# Patient Record
Sex: Male | Born: 1955 | Race: White | Hispanic: No | Smoking: Former smoker
Health system: Southern US, Community
[De-identification: ages and names within clinical notes are randomized; demographics above are authoritative.]

## PROBLEM LIST (undated history)

## (undated) DIAGNOSIS — I359 Nonrheumatic aortic valve disorder, unspecified: Secondary | ICD-10-CM

## (undated) DIAGNOSIS — C61 Malignant neoplasm of prostate: Secondary | ICD-10-CM

## (undated) DIAGNOSIS — G4733 Obstructive sleep apnea (adult) (pediatric): Secondary | ICD-10-CM

## (undated) DIAGNOSIS — R079 Chest pain, unspecified: Secondary | ICD-10-CM

## (undated) DIAGNOSIS — M1710 Unilateral primary osteoarthritis, unspecified knee: Secondary | ICD-10-CM

## (undated) DIAGNOSIS — E7800 Pure hypercholesterolemia, unspecified: Secondary | ICD-10-CM

## (undated) DIAGNOSIS — R0602 Shortness of breath: Secondary | ICD-10-CM

## (undated) DIAGNOSIS — F411 Generalized anxiety disorder: Secondary | ICD-10-CM

## (undated) DIAGNOSIS — G4700 Insomnia, unspecified: Secondary | ICD-10-CM

## (undated) DIAGNOSIS — IMO0002 Reserved for concepts with insufficient information to code with codable children: Secondary | ICD-10-CM

## (undated) DIAGNOSIS — I10 Essential (primary) hypertension: Secondary | ICD-10-CM

## (undated) DIAGNOSIS — Z9989 Dependence on other enabling machines and devices: Secondary | ICD-10-CM

## (undated) HISTORY — DX: Insomnia, unspecified: G47.00

## (undated) HISTORY — PX: ELBOW SURGERY: SHX618

## (undated) HISTORY — PX: LUMBAR LAMINECTOMY: SHX95

## (undated) HISTORY — DX: Reserved for concepts with insufficient information to code with codable children: IMO0002

## (undated) HISTORY — DX: Obstructive sleep apnea (adult) (pediatric): G47.33

## (undated) HISTORY — DX: Pure hypercholesterolemia, unspecified: E78.00

## (undated) HISTORY — DX: Generalized anxiety disorder: F41.1

## (undated) HISTORY — DX: Malignant neoplasm of prostate: C61

## (undated) HISTORY — DX: Nonrheumatic aortic valve disorder, unspecified: I35.9

## (undated) HISTORY — PX: PROSTATE BIOPSY: SHX241

## (undated) HISTORY — DX: Shortness of breath: R06.02

## (undated) HISTORY — DX: Chest pain, unspecified: R07.9

## (undated) HISTORY — PX: COLONOSCOPY: SHX174

## (undated) HISTORY — DX: Unilateral primary osteoarthritis, unspecified knee: M17.10

## (undated) HISTORY — DX: Dependence on other enabling machines and devices: Z99.89

## (undated) HISTORY — DX: Essential (primary) hypertension: I10

## (undated) HISTORY — PX: NEUROPLASTY / TRANSPOSITION MEDIAN NERVE AT CARPAL TUNNEL BILATERAL: SUR894

---

## 1998-07-09 ENCOUNTER — Ambulatory Visit: Admission: RE | Admit: 1998-07-09 | Discharge: 1998-07-09 | Payer: Self-pay | Admitting: *Deleted

## 1999-12-23 ENCOUNTER — Emergency Department (HOSPITAL_COMMUNITY): Admission: EM | Admit: 1999-12-23 | Discharge: 1999-12-23 | Payer: Self-pay | Admitting: Emergency Medicine

## 1999-12-23 ENCOUNTER — Encounter: Payer: Self-pay | Admitting: Emergency Medicine

## 2004-05-17 ENCOUNTER — Encounter: Admission: RE | Admit: 2004-05-17 | Discharge: 2004-05-17 | Payer: Self-pay | Admitting: Orthopedic Surgery

## 2004-06-11 ENCOUNTER — Encounter: Admission: RE | Admit: 2004-06-11 | Discharge: 2004-06-11 | Payer: Self-pay | Admitting: Orthopedic Surgery

## 2004-06-24 ENCOUNTER — Encounter: Admission: RE | Admit: 2004-06-24 | Discharge: 2004-06-24 | Payer: Self-pay | Admitting: Orthopedic Surgery

## 2006-10-31 ENCOUNTER — Emergency Department (HOSPITAL_COMMUNITY): Admission: EM | Admit: 2006-10-31 | Discharge: 2006-10-31 | Payer: Self-pay | Admitting: Emergency Medicine

## 2007-09-12 ENCOUNTER — Emergency Department (HOSPITAL_COMMUNITY): Admission: EM | Admit: 2007-09-12 | Discharge: 2007-09-12 | Payer: Self-pay | Admitting: Emergency Medicine

## 2007-09-22 ENCOUNTER — Ambulatory Visit: Payer: Self-pay

## 2007-09-27 ENCOUNTER — Ambulatory Visit: Payer: Self-pay

## 2007-10-06 ENCOUNTER — Encounter (INDEPENDENT_AMBULATORY_CARE_PROVIDER_SITE_OTHER): Payer: Self-pay | Admitting: Internal Medicine

## 2007-10-06 ENCOUNTER — Ambulatory Visit: Payer: Self-pay

## 2007-10-07 ENCOUNTER — Ambulatory Visit: Payer: Self-pay | Admitting: Cardiovascular Disease

## 2007-10-07 LAB — CONVERTED CEMR LAB
BUN: 16 mg/dL (ref 6–23)
Basophils Absolute: 0.1 10*3/uL (ref 0.0–0.1)
Basophils Relative: 1.4 % — ABNORMAL HIGH (ref 0.0–1.0)
CO2: 32 meq/L (ref 19–32)
Calcium: 9.6 mg/dL (ref 8.4–10.5)
Chloride: 100 meq/L (ref 96–112)
Creatinine, Ser: 1.1 mg/dL (ref 0.4–1.5)
Eosinophils Absolute: 0.1 10*3/uL (ref 0.0–0.6)
Eosinophils Relative: 1.8 % (ref 0.0–5.0)
GFR calc Af Amer: 91 mL/min
GFR calc non Af Amer: 75 mL/min
Glucose, Bld: 113 mg/dL — ABNORMAL HIGH (ref 70–99)
HCT: 44.1 % (ref 39.0–52.0)
Hemoglobin: 14.9 g/dL (ref 13.0–17.0)
INR: 0.8 (ref 0.8–1.0)
Lymphocytes Relative: 22.5 % (ref 12.0–46.0)
MCHC: 33.8 g/dL (ref 30.0–36.0)
MCV: 93.8 fL (ref 78.0–100.0)
Monocytes Absolute: 0.8 10*3/uL — ABNORMAL HIGH (ref 0.2–0.7)
Monocytes Relative: 14.5 % — ABNORMAL HIGH (ref 3.0–11.0)
Neutro Abs: 3.3 10*3/uL (ref 1.4–7.7)
Neutrophils Relative %: 59.8 % (ref 43.0–77.0)
Platelets: 281 10*3/uL (ref 150–400)
Potassium: 4 meq/L (ref 3.5–5.1)
Prothrombin Time: 10.6 s — ABNORMAL LOW (ref 10.9–13.3)
RBC: 4.71 M/uL (ref 4.22–5.81)
RDW: 11.9 % (ref 11.5–14.6)
Sodium: 139 meq/L (ref 135–145)
WBC: 5.5 10*3/uL (ref 4.5–10.5)
aPTT: 23.7 s (ref 21.7–29.8)

## 2007-10-13 ENCOUNTER — Inpatient Hospital Stay (HOSPITAL_BASED_OUTPATIENT_CLINIC_OR_DEPARTMENT_OTHER): Admission: RE | Admit: 2007-10-13 | Discharge: 2007-10-13 | Payer: Self-pay | Admitting: Cardiovascular Disease

## 2007-10-13 ENCOUNTER — Ambulatory Visit: Payer: Self-pay | Admitting: Cardiovascular Disease

## 2007-10-13 DIAGNOSIS — I2510 Atherosclerotic heart disease of native coronary artery without angina pectoris: Secondary | ICD-10-CM | POA: Insufficient documentation

## 2007-11-09 ENCOUNTER — Ambulatory Visit: Payer: Self-pay | Admitting: Cardiovascular Disease

## 2008-02-11 ENCOUNTER — Ambulatory Visit: Payer: Self-pay | Admitting: Cardiovascular Disease

## 2008-05-18 ENCOUNTER — Ambulatory Visit: Payer: Self-pay | Admitting: Cardiovascular Disease

## 2008-06-13 ENCOUNTER — Ambulatory Visit: Payer: Self-pay | Admitting: Cardiovascular Disease

## 2008-06-13 LAB — CONVERTED CEMR LAB
BUN: 13 mg/dL (ref 6–23)
CO2: 29 meq/L (ref 19–32)
Calcium: 9.9 mg/dL (ref 8.4–10.5)
Chloride: 98 meq/L (ref 96–112)
Creatinine, Ser: 1 mg/dL (ref 0.4–1.5)
GFR calc Af Amer: 101 mL/min
GFR calc non Af Amer: 84 mL/min
Glucose, Bld: 120 mg/dL — ABNORMAL HIGH (ref 70–99)
Potassium: 4.2 meq/L (ref 3.5–5.1)
Sodium: 136 meq/L (ref 135–145)

## 2008-07-10 ENCOUNTER — Ambulatory Visit: Payer: Self-pay | Admitting: Cardiovascular Disease

## 2008-10-31 ENCOUNTER — Ambulatory Visit: Payer: Self-pay | Admitting: Licensed Clinical Social Worker

## 2008-11-08 ENCOUNTER — Ambulatory Visit: Payer: Self-pay | Admitting: Licensed Clinical Social Worker

## 2008-11-14 ENCOUNTER — Ambulatory Visit: Payer: Self-pay | Admitting: Licensed Clinical Social Worker

## 2008-11-28 ENCOUNTER — Ambulatory Visit: Payer: Self-pay | Admitting: Licensed Clinical Social Worker

## 2008-12-12 ENCOUNTER — Ambulatory Visit: Payer: Self-pay | Admitting: Licensed Clinical Social Worker

## 2008-12-19 ENCOUNTER — Ambulatory Visit: Payer: Self-pay | Admitting: Licensed Clinical Social Worker

## 2008-12-29 ENCOUNTER — Ambulatory Visit: Payer: Self-pay | Admitting: Licensed Clinical Social Worker

## 2009-12-04 ENCOUNTER — Encounter: Payer: Self-pay | Admitting: Cardiovascular Disease

## 2009-12-07 DIAGNOSIS — I10 Essential (primary) hypertension: Secondary | ICD-10-CM | POA: Insufficient documentation

## 2009-12-07 DIAGNOSIS — E7800 Pure hypercholesterolemia, unspecified: Secondary | ICD-10-CM | POA: Insufficient documentation

## 2009-12-07 DIAGNOSIS — I359 Nonrheumatic aortic valve disorder, unspecified: Secondary | ICD-10-CM | POA: Insufficient documentation

## 2009-12-10 DIAGNOSIS — F411 Generalized anxiety disorder: Secondary | ICD-10-CM | POA: Insufficient documentation

## 2009-12-10 DIAGNOSIS — M1710 Unilateral primary osteoarthritis, unspecified knee: Secondary | ICD-10-CM

## 2009-12-10 DIAGNOSIS — IMO0002 Reserved for concepts with insufficient information to code with codable children: Secondary | ICD-10-CM | POA: Insufficient documentation

## 2009-12-10 DIAGNOSIS — G4700 Insomnia, unspecified: Secondary | ICD-10-CM | POA: Insufficient documentation

## 2009-12-11 ENCOUNTER — Ambulatory Visit: Payer: Self-pay | Admitting: Cardiovascular Disease

## 2010-01-21 ENCOUNTER — Telehealth: Payer: Self-pay | Admitting: Cardiovascular Disease

## 2010-01-23 ENCOUNTER — Encounter: Payer: Self-pay | Admitting: Physician Assistant

## 2010-01-23 ENCOUNTER — Ambulatory Visit: Payer: Self-pay | Admitting: Cardiology

## 2010-01-23 DIAGNOSIS — R079 Chest pain, unspecified: Secondary | ICD-10-CM | POA: Insufficient documentation

## 2010-01-23 DIAGNOSIS — R0602 Shortness of breath: Secondary | ICD-10-CM | POA: Insufficient documentation

## 2010-01-28 ENCOUNTER — Telehealth (INDEPENDENT_AMBULATORY_CARE_PROVIDER_SITE_OTHER): Payer: Self-pay | Admitting: *Deleted

## 2010-01-29 ENCOUNTER — Ambulatory Visit: Payer: Self-pay

## 2010-01-29 ENCOUNTER — Encounter (HOSPITAL_COMMUNITY): Admission: RE | Admit: 2010-01-29 | Discharge: 2010-04-10 | Payer: Self-pay | Admitting: Cardiovascular Disease

## 2010-01-29 ENCOUNTER — Encounter: Payer: Self-pay | Admitting: Cardiology

## 2010-01-29 ENCOUNTER — Ambulatory Visit: Payer: Self-pay | Admitting: Cardiology

## 2010-02-26 ENCOUNTER — Ambulatory Visit: Payer: Self-pay | Admitting: Cardiovascular Disease

## 2010-07-09 ENCOUNTER — Encounter: Admission: RE | Admit: 2010-07-09 | Discharge: 2010-07-09 | Payer: Self-pay | Admitting: Orthopedic Surgery

## 2010-08-20 ENCOUNTER — Encounter
Admission: RE | Admit: 2010-08-20 | Discharge: 2010-08-20 | Payer: Self-pay | Source: Home / Self Care | Attending: Orthopedic Surgery | Admitting: Orthopedic Surgery

## 2010-08-27 ENCOUNTER — Encounter
Admission: RE | Admit: 2010-08-27 | Discharge: 2010-08-27 | Payer: Self-pay | Source: Home / Self Care | Attending: Orthopedic Surgery | Admitting: Orthopedic Surgery

## 2010-09-03 NOTE — Assessment & Plan Note (Signed)
Summary: F2Y   Primary Provider:  DR.    History of Present Illness: Alejandro [name] is seen today for f/U of CAD, HTN, and elevated lipids.  He is on naltrexone for ETOH abuse and apparantly has had issues with cocaine in the past.  Stress levels are high.  He has had his meds adjusted by Dr Timothy Lasso for some insomnia and heachaches.  He has a collateralized RCA with no left sided disease by cath in 2009.  He is not having SSCP, palpitations, dyspnea or syncope.  He is compliant with his meds.  Current Problems (verified): 1)  Cad  (ICD-414.00) 2)  Hypertension  (ICD-401.9) 3)  Osteoarthritis, Knee  (ICD-715.96) 4)  Insomnia  (ICD-780.52) 5)  Anxiety  (ICD-300.00) 6)  Aortic Stenosis, Mild  (ICD-424.1) 7)  Hypercholesterolemia  (ICD-272.0)  Current Medications (verified): 1)  Atenolol 25 Mg Tabs (Atenolol) .... Take One Tablet By Mouth Daily 2)  Benicar 40 Mg Tabs (Olmesartan Medoxomil) .... Take One Tablet By Mouth Daily 3)  Bystolic 10 Mg Tabs (Nebivolol Hcl) .Marland Kitchen.. 1 Tab By Mouth Once Daily 4)  Aspirin Ec 325 Mg Tbec (Aspirin) .... Take One Tablet By Mouth Daily 5)  Multivitamins   Tabs (Multiple Vitamin) .Marland Kitchen.. 1 Tab By Mouth Once Daily  Allergies (verified): No Known Drug Allergies  Past History:  Past Medical History: Last updated: 12/07/2009 Current Problems:  AS:  mild by echo in 2009 CAD (ICD-414.00) cath 2009 collateralized RCA with no significant left sided disease HYPERTENSION (ICD-401.9) OSTEOARTHRITIS, KNEE (ICD-715.96) INSOMNIA (ICD-780.52) ANXIETY (ICD-300.00) AORTIC STENOSIS, MILD (ICD-424.1) HYPERCHOLESTEROLEMIA (ICD-272.0)  Family History: Last updated: 12/07/2009  Is remarkable for high blood pressure but no premature   coronary disease.      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  He is currently separated.  He has an 44 year old son   who is doing well.  He does not  exercise on a regular basis.  He quit   smoking in 2001 and does drink on occasion.   Review of Systems       Denies fever, malais, weight loss, blurry vision, decreased visual acuity, cough, sputum, SOB, hemoptysis, pleuritic pain, palpitaitons, heartburn, abdominal pain, melena, lower extremity edema, claudication, or rash.   Vital Signs:  Patient profile:   55 year old male Height:      70 inches Weight:      203 pounds BMI:     29.23 Pulse rate:   56 / minute Resp:     12 per minute BP sitting:   148 / 83  (left arm)  Vitals Entered By: Kem Parkinson (Dec 11, 2009 4:09 PM)  Physical Exam  General:  Affect appropriate Healthy:  appears stated age HEENT: normal Neck supple with no adenopathy JVP normal no bruits no thyromegaly Lungs clear with no wheezing and good diaphragmatic motion Heart:  S1/S2 no murmur,rub, gallop or click PMI normal Abdomen: benighn, BS positve, no tenderness, no AAA no bruit.  No HSM or HJR Distal pulses intact with no bruits No edema Neuro non-focal Skin warm and dry    Impression & Recommendations:  Problem # 1:  CAD (ICD-414.00) Stable no angina continue asa and BB His updated medication list for this problem includes:    Atenolol 25 Mg Tabs (Atenolol) .Marland Kitchen... Take one tablet by mouth daily    Bystolic 10 Mg Tabs (Nebivolol hcl) .Marland Kitchen... 1 tab by mouth once  daily    Aspirin Ec 325 Mg Tbec (Aspirin) .Marland Kitchen... Take one tablet by mouth daily  Problem # 2:  HYPERTENSION (ICD-401.9) Well controlled.  fluctuations from anxiety levels His updated medication list for this problem includes:    Atenolol 25 Mg Tabs (Atenolol) .Marland Kitchen... Take one tablet by mouth daily    Benicar 40 Mg Tabs (Olmesartan medoxomil) .Marland Kitchen... Take one tablet by mouth daily    Bystolic 10 Mg Tabs (Nebivolol hcl) .Marland Kitchen... 1 tab by mouth once daily    Aspirin Ec 325 Mg Tbec (Aspirin) .Marland Kitchen... Take one tablet by mouth daily  Problem # 3:  HYPERCHOLESTEROLEMIA (ICD-272.0) Continue statin.   F/U labs per Dr Timothy Lasso

## 2010-09-03 NOTE — Consult Note (Signed)
Summary: [facility]   Imported By: Marylou Mccoy 12/17/2009 11:07:12  _____________________________________________________________________  External Attachment:    Type:   Image     Comment:   External Document

## 2010-09-03 NOTE — Progress Notes (Signed)
Summary: Chest pains,sob  Phone Note Call from Patient Call back at Home Phone [phone]   Caller: Patient Summary of Call: Ptt having chest pains Initial call taken by: Judie Grieve,  January 21, 2010 9:55 AM  Follow-up for Phone Call        Spoke with pt. Pt. c/o of mild pain on far left side on chest comes and goes,  scor or  "2". Pt. denies having sob at this time. He  states has sob when he is  working out. Also c/o of having ankle edema. Pt denies chest pain with activity. Pt states Dr. Eden Emms has discussed  his symtoms,and   the need of a cardiac cath. no other tests. Pt. would like to be seen in the office some time this week. An appointment was made for pt. with the PA on wednesday June 22/11 at 8:15 AM pt aware. Follow-up by: Ollen Gross, RN, BSN,  January 21, 2010 10:28 AM

## 2010-09-03 NOTE — Assessment & Plan Note (Signed)
Summary: Chest/pain mild, w/activity,LE edema/nm   Primary Provider:  DR.   CC:  chest pain.  History of Present Illness: This is a 55 year old white male patient with history of coronary artery disease. He had a cardiac catheterization in 2009 which revealed a collateralized right coronary artery without left disease. This was performed after an abnormal Myoview that showed inferior scar but no ischemia. The patient also has history of alcohol abuse and cocaine use in the past. He currently drinks 2 cups one a day but denies cocaine use.  The patient presents today complaining of greater thmonth history of progressive dyspnea on exertion. He says he becomes short of breath going up stairs. He goes to the gym daily any severe able to run 3 miles in 30 minutes. Now he can't run more than 5 minutes without becoming short of breath. He has adjusted his exercise routine and continues to do cardio but can't run.  The patient also has episodic chest pain. He describes it as twinges that are fleeting. He denies any chest pressure heaviness. The chest pain does not occur with activity.  The patient's blood pressure has been elevated recently and he noted some ankle edema on occasion. He does admit to eating out a lot and excessive sodium intake.  Current Medications (verified): 1)  Benicar 40 Mg Tabs (Olmesartan Medoxomil) .... Take One Tablet By Mouth Daily 2)  Bystolic 10 Mg Tabs (Nebivolol Hcl) .Marland Kitchen.. 1 Tab By Mouth Once Daily 3)  Aspirin Ec 325 Mg Tbec (Aspirin) .... Take One Tablet By Mouth Daily 4)  Multivitamins   Tabs (Multiple Vitamin) .Marland Kitchen.. 1 Tab By Mouth Once Daily 5)  Zetia 10 Mg Tabs (Ezetimibe) .Marland Kitchen.. 1 By Mouth Daily 6)  Lamictal 150 Mg Tabs (Lamotrigine) .Marland Kitchen.. 1 By Mouth Daily 7)  Gabapentin 300 Mg Caps (Gabapentin) .Marland Kitchen.. 1 By Mouth Two Times A Day 8)  Amlodipine .Marland Kitchen.. 1 By Mouth Dialy  Allergies (verified): No Known Drug Allergies  Past History:  Past Medical History: Last updated:  12/07/2009 Current Problems:  AS:  mild by echo in 2009 CAD (ICD-414.00) cath 2009 collateralized RCA with no significant left sided disease HYPERTENSION (ICD-401.9) OSTEOARTHRITIS, KNEE (ICD-715.96) INSOMNIA (ICD-780.52) ANXIETY (ICD-300.00) AORTIC STENOSIS, MILD (ICD-424.1) HYPERCHOLESTEROLEMIA (ICD-272.0)  Social History: Reviewed history from 12/07/2009 and no changes required. The patient does executive searches for client companies.  He is under a   lot of stress.  He is currently separated.  He has an 54 year old son   who is doing well.  He does not exercise on a regular basis.  He quit   smoking in 2001 and does drink on occasion.   Review of Systems       see history of present illness  Vital Signs:  Patient profile:   55 year old male Height:      70 inches Weight:      206 pounds BMI:     29.66 Pulse rate:   60 / minute Resp:     16 per minute BP sitting:   142 / 86  (right arm)  Vitals Entered By: Marrion Coy, CNA (January 23, 2010 8:19 AM)  Physical Exam  General:   Well-nournished, in no acute distress. Neck: No JVD, HJR, Bruit, or thyroid enlargement Lungs: No tachypnea, clear without wheezing, rales, or rhonchi Cardiovascular: RRR, PMI not displaced, her white murmur at the right sternal border, no bruit, thrill, or heave. Abdomen: BS normal. Soft without organomegaly, masses, lesions or tenderness. Extremities: without cyanosis,  clubbing or edema. Good distal pulses bilateral SKin: Warm, no lesions or rashes  Musculoskeletal: No deformities Neuro: no focal signs    EKG  Procedure date:  01/23/2010  Findings:      normal sinus rhythm with LVH early repolarization.  Impression & Recommendations:  Problem # 1:  DYSPNEA (ICD-786.05) Patient has greater than 6 month history of progressive dyspnea on exertion. He's had to decrease his exercise routine because of this. I will order a stress Myoview to rule out ischemia. The following medications  were removed from the medication list:    Atenolol 25 Mg Tabs (Atenolol) .Marland Kitchen... Take one tablet by mouth daily His updated medication list for this problem includes:    Benicar 40 Mg Tabs (Olmesartan medoxomil) .Marland Kitchen... Take one tablet by mouth daily    Bystolic 10 Mg Tabs (Nebivolol hcl) .Marland Kitchen... 1 tab by mouth once daily    Aspirin Ec 325 Mg Tbec (Aspirin) .Marland Kitchen... Take one tablet by mouth daily  Problem # 2:  CHEST PAIN-UNSPECIFIED (ICD-786.50) patient's chest pain is atypical for coronary artery disease. He does doheavy lifting with weights and it may be musculoskeletal in origin. The following medications were removed from the medication list:    Atenolol 25 Mg Tabs (Atenolol) .Marland Kitchen... Take one tablet by mouth daily His updated medication list for this problem includes:    Bystolic 10 Mg Tabs (Nebivolol hcl) .Marland Kitchen... 1 tab by mouth once daily    Aspirin Ec 325 Mg Tbec (Aspirin) .Marland Kitchen... Take one tablet by mouth daily  Problem # 3:  CAD (ICD-414.00) Patient had catheter in 2009 which showed collateralized RCA with normal left-sided heart. I cannot locate the actual catheter report this is taken from the Preload. The following medications were removed from the medication list:    Atenolol 25 Mg Tabs (Atenolol) .Marland Kitchen... Take one tablet by mouth daily His updated medication list for this problem includes:    Bystolic 10 Mg Tabs (Nebivolol hcl) .Marland Kitchen... 1 tab by mouth once daily    Aspirin Ec 325 Mg Tbec (Aspirin) .Marland Kitchen... Take one tablet by mouth daily  Orders: Nuclear Stress Test (Nuc Stress Test) EKG w/ Interpretation (93000)  Problem # 4:  HYPERTENSION (ICD-401.9) Patient's blood pressure is elevated today. He has increased his eating out and increased his sodium intake.Mask and to follow a 2 g sodium diet and take his blood pressure and keep a log. The following medications were removed from the medication list:    Atenolol 25 Mg Tabs (Atenolol) .Marland Kitchen... Take one tablet by mouth daily His updated medication  list for this problem includes:    Benicar 40 Mg Tabs (Olmesartan medoxomil) .Marland Kitchen... Take one tablet by mouth daily    Bystolic 10 Mg Tabs (Nebivolol hcl) .Marland Kitchen... 1 tab by mouth once daily    Aspirin Ec 325 Mg Tbec (Aspirin) .Marland Kitchen... Take one tablet by mouth daily  Orders: Nuclear Stress Test (Nuc Stress Test)  Patient Instructions: 1)  Your physician recommends that you schedule a follow-up appointment in: 0ne month with Dr. Eden Emms 2)  Your physician has requested that you limit the intake of sodium (salt) in your diet to two grams daily. Please see [facility] handout. 3)  Your physician has requested that you have an exercise stress myoview.  For further information please visit [email or web address]  Please follow instruction sheet, as given. 4)  Your MD recommeds to check B/P twice a day and keep a log.

## 2010-09-03 NOTE — Assessment & Plan Note (Signed)
Summary: per check out/sf      Allergies Added:   Primary Provider:  DR.    History of Present Illness: This is a 55 year old white male patient with history of coronary artery disease. He had a cardiac catheterization in 2009 which revealed a collateralized right coronary artery without left disease. This was performed after an abnormal Myoview that showed inferior scar but no ischemia. The patient also has history of alcohol abuse and cocaine use in the past. He currently drinks 2 cups one a day but denies cocaine use.  The patient presents today complaining of greater thmonth history of progressive dyspnea on exertion. He says he becomes short of breath going up stairs. He goes to the gym daily any severe able to run 3 miles in 30 minutes. Now he can't run more than 5 minutes without becoming short of breath. He has adjusted his exercise routine and continues to do cardio but can't run.  The patient also has episodic chest pain. He describes it as twinges that are fleeting. He denies any chest pressure heaviness. The chest pain does not occur with activity.  The patient's blood pressure has been elevated recently and he noted some ankle edema on occasion. He does admit to eating out a lot and excessive sodium intake.  I discussed the fact that he should not have dyspnea with normal LV funciton.  His target HR with stress is 135 and his resting HR is fine.  We chose bystolic to prevent excessive resting bradycardia.    He may be a good candidate for CT scanning with the 256 when it arrives   Current Problems (verified): 1)  Chest Pain-unspecified  (ICD-786.50) 2)  Dyspnea  (ICD-786.05) 3)  Cad  (ICD-414.00) 4)  Hypertension  (ICD-401.9) 5)  Osteoarthritis, Knee  (ICD-715.96) 6)  Insomnia  (ICD-780.52) 7)  Anxiety  (ICD-300.00) 8)  Aortic Stenosis, Mild  (ICD-424.1) 9)  Hypercholesterolemia  (ICD-272.0)  Current Medications (verified): 1)  Benicar 40 Mg Tabs (Olmesartan Medoxomil)  .... Take One Tablet By Mouth Daily 2)  Bystolic 10 Mg Tabs (Nebivolol Hcl) .Marland Kitchen.. 1 Tab By Mouth Once Daily 3)  Aspirin Ec 325 Mg Tbec (Aspirin) .... Take One Tablet By Mouth Daily 4)  Multivitamins   Tabs (Multiple Vitamin) .Marland Kitchen.. 1 Tab By Mouth Once Daily 5)  Zetia 10 Mg Tabs (Ezetimibe) .Marland Kitchen.. 1 By Mouth Daily 6)  Lamictal 150 Mg Tabs (Lamotrigine) .Marland Kitchen.. 1 By Mouth Daily 7)  Gabapentin 300 Mg Caps (Gabapentin) .Marland Kitchen.. 1 By Mouth Two Times A Day 8)  Amlodipine .Marland Kitchen.. 1 By Mouth Dialy  Allergies (verified): 1)  Codeine  Past History:  Past Medical History: Last updated: 12/07/2009 Current Problems:  AS:  mild by echo in 2009 CAD (ICD-414.00) cath 2009 collateralized RCA with no significant left sided disease HYPERTENSION (ICD-401.9) OSTEOARTHRITIS, KNEE (ICD-715.96) INSOMNIA (ICD-780.52) ANXIETY (ICD-300.00) AORTIC STENOSIS, MILD (ICD-424.1) HYPERCHOLESTEROLEMIA (ICD-272.0)  Family History: Last updated: 12/07/2009  Is remarkable for high blood pressure but no premature   coronary disease.      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  He is currently separated.  He has an 94 year old son   who is doing well.  He does not exercise on a regular basis.  He quit   smoking in 2001 and does drink on occasion.   Review of Systems       Denies fever, malais, weight loss, blurry  vision, decreased visual acuity, cough, sputum, SOB, hemoptysis, pleuritic pain, palpitaitons, heartburn, abdominal pain, melena, lower extremity edema, claudication, or rash.   Vital Signs:  Patient profile:   55 year old male Height:      70 inches Weight:      209 pounds BMI:     30.10 Pulse rate:   64 / minute Resp:     14 per minute BP sitting:   130 / 80  (left arm)  Vitals Entered By: Kem Parkinson (February 26, 2010 8:31 AM)  Physical Exam  General:  Affect appropriate Healthy:  appears stated age HEENT:  normal Neck supple with no adenopathy JVP normal no bruits no thyromegaly Lungs clear with no wheezing and good diaphragmatic motion Heart:  S1/S2 midl AS murmur,rub, gallop or click PMI normal Abdomen: benighn, BS positve, no tenderness, no AAA no bruit.  No HSM or HJR Distal pulses intact with no bruits No edema Neuro non-focal Skin warm and dry    Impression & Recommendations:  Problem # 1:  CHEST PAIN-UNSPECIFIED (ICD-786.50) Stable collaterilized RCA  continue medical Rx Consider CT His updated medication list for this problem includes:    Bystolic 10 Mg Tabs (Nebivolol hcl) .Marland Kitchen... 1 tab by mouth once daily    Aspirin Ec 325 Mg Tbec (Aspirin) .Marland Kitchen... Take one tablet by mouth daily  Problem # 2:  HYPERTENSION (ICD-401.9) Well controlled His updated medication list for this problem includes:    Benicar 40 Mg Tabs (Olmesartan medoxomil) .Marland Kitchen... Take one tablet by mouth daily    Bystolic 10 Mg Tabs (Nebivolol hcl) .Marland Kitchen... 1 tab by mouth once daily    Aspirin Ec 325 Mg Tbec (Aspirin) .Marland Kitchen... Take one tablet by mouth daily  Problem # 3:  AORTIC STENOSIS, MILD (ICD-424.1) Mild by exam  F/u echo in a year His updated medication list for this problem includes:    Benicar 40 Mg Tabs (Olmesartan medoxomil) .Marland Kitchen... Take one tablet by mouth daily    Bystolic 10 Mg Tabs (Nebivolol hcl) .Marland Kitchen... 1 tab by mouth once daily  Problem # 4:  HYPERCHOLESTEROLEMIA (ICD-272.0) Intol to statins F/U labs primary His updated medication list for this problem includes:    Zetia 10 Mg Tabs (Ezetimibe) .Marland Kitchen... 1 by mouth daily  Patient Instructions: 1)  Your physician wants you to follow-up in: 6 months with Dr Eden Emms. You will receive a reminder letter in the mail two months in advance. If you don't receive a letter, please call our office to schedule the follow-up appointment.   EKG Report  Procedure date:  01/23/2010  Findings:      NSR LAE LVH Early repol Rate 60

## 2010-09-03 NOTE — Miscellaneous (Signed)
   EKG  Procedure date:  12/11/2009  Findings:      NSR 56 LAE LVH Early repol

## 2010-09-03 NOTE — Progress Notes (Signed)
Summary: Nuclear Pre-Procedure  Phone Note Outgoing Call Call back at [facility] Phone [phone]   Call placed by: Stanton Kidney, EMT-P,  January 28, 2010 3:14 PM Call placed to: Patient Action Taken: Phone Call Completed Summary of Call: Reviewed information on Myoview Information Sheet (see scanned document for further details).  Spoke with Patient.    Nuclear Med Background Indications for Stress Test: Evaluation for Ischemia   History: Echo, Heart Catheterization, Myocardial Perfusion Study  History Comments: '09 MPS: EF=56%, (-) ischemia, infero-lat scar. > Heart Cath: single V Dz '09 Echo: EF=70%, mild AS  Symptoms: Chest Pain, DOE, Fatigue with Exertion    Nuclear Pre-Procedure Cardiac Risk Factors: Family History - CAD, History of Smoking, Hypertension, Lipids Height (in): 70

## 2010-09-03 NOTE — Assessment & Plan Note (Signed)
Summary: Cardiology Nuclear Study  Nuclear Med Background Indications for Stress Test: Evaluation for Ischemia   History: Echo, Heart Catheterization, Myocardial Perfusion Study  History Comments: '09 MPS:no ischemia, infero-lateral scar, EF=56%>Cath:single vessel dz., collateralized RCA; '09 Echo:EF=70%, mild AS; remote h/o cocaine abuse  Symptoms: Chest Pain, DOE, Fatigue, Fatigue with Exertion  Symptoms Comments: Last episode of CP:2 days ago.   Nuclear Pre-Procedure Cardiac Risk Factors: Family History - CAD, History of Smoking, Hypertension, Lipids, Overweight Caffeine/Decaff Intake: None NPO After: 9:00 PM Lungs: Clear IV 0.9% NS with Angio Cath: 20g     IV Site: (R) AC IV Started by: Irean Hong RN Chest Size (in) 44     Height (in): 70 Weight (lb): 206 BMI: 29.66 Tech Comments: Bystolic held x 36 hours.  Nuclear Med Study 1 or 2 day study:  1 day     Stress Test Type:  Stress Reading MD:  Willa Rough, MD     Referring MD:  Charlton Haws, MD Resting Radionuclide:  Technetium 86m Tetrofosmin     Resting Radionuclide Dose:  11 mCi  Stress Radionuclide:  Technetium 50m Tetrofosmin     Stress Radionuclide Dose:  33 mCi   Stress Protocol Exercise Time (min):  14:15 min     Max HR:  133 bpm     Predicted Max HR:  168 bpm  Max Systolic BP: 154 mm Hg     Percent Max HR:  79.17 %     METS: 17.5 Rate Pressure Product:  16109    Stress Test Technologist:  Rea College CMA-N     Nuclear Technologist:  Domenic Polite CNMT  Rest Procedure  Myocardial perfusion imaging was performed at rest 45 minutes following the intravenous administration of Myoview Technetium 66m Tetrofosmin.  Stress Procedure  The patient exercised for 14:15.  The patient stopped due to fatigue and denied any chest pain.  There were no diagnostic ST-T wave changes.  There was a hypotensive response at peak exercise with drop in BP from 151/84 to 124/87.  Myoview was injected at peak exercise and  myocardial perfusion imaging was performed after a brief delay.  QPS Raw Data Images:  Normal; no motion artifact; normal heart/lung ratio. Stress Images:  Mild decrease in activity at the base of the inferior wall. Rest Images:  Slight decrease in inferior activity Subtraction (SDS):  Slight reversibility  Transient Ischemic Dilatation:  1.12  (Normal <1.22)  Lung/Heart Ratio:  .33  (Normal <0.45)  Quantitative Gated Spect Images QGS EDV:  152 ml QGS ESV:  76 ml QGS EF:  50 % QGS cine images:  Low normal EF at rest.  Findings Mildly Abnormal      Overall Impression  Exercise Capacity: Excellent exercise capacity. BP Response: Normal blood pressure response. Clinical Symptoms: No chest pain ECG Impression: No significant ST segment change suggestive of ischemia. Overall Impression Comments: At peak stress there was slight decrease in BP. There were no symptoms and no EKG changes. There is excellent exercise tolerance. There is suggestion of mild inferior ischemia at this level of stress.  Appended Document: Cardiology Nuclear Study mild inferior ishemia with known collateralized RCA  OK  Appended Document: Cardiology Nuclear Study pt aware of results

## 2010-12-17 NOTE — Assessment & Plan Note (Signed)
[facility]                            CARDIOLOGY OFFICE NOTE   Alejandro [name], Alejandro [name]                MRN:          [id number]  DATE:05/18/2008                            DOB:          16-Feb-1956    Mr. Alejandro [name] returns today for followup.  We had a nice visit.  He  showed me a blood pressure reading from home from February 12, 2008, through  May 17, 2008.  He continues to run slightly high with frequent  readings in the 150-160 range.  He had a bad headache recently, this was  worked up by Dr. Timothy Lasso.  He apparently had a sinus infection at one  point, he was switched from Benicar to Avalide.   In reviewing his medications, he is currently taking a beta-blocker,  calcium blocker, and Benicar.  I told him I would probably add back a  diuretic to his regimen to help these blood pressure medicines work  better.   The easiest thing to do would be to add Benicar/hydrochlorothiazide, he  is willing to do this.  He continues to be a bit heavy, but he does seem  to try to watch his salt intake.   His review of systems is otherwise negative.  He is not having any  significant chest pain, PND, or orthopnea.   He has been intolerant to CRESTOR in the past.   He is currently on Norvasc 10 a day, Benicar 40 a day, atenolol 25 a  day, multivitamins, WelChol 650 a day, Zetia 10 a day.   Exam is remarkable for blood pressure of 150/80, pulse 80 and regular,  respiratory rate 14, afebrile, weight 205.  HEENT, unremarkable.  Carotids are normal without bruit.  No lymphadenopathy, thyromegaly, or  JVP elevation.  Lungs clear, good diaphragmatic motion.  No wheezing.  S1 and S2.  Normal heart sounds.  PMI normal.  Abdomen is benign.  Bowel  sounds are positive.  No AAA, no tenderness, no bruit, no  hepatosplenomegaly, no hepatojugular reflux.  No tenderness.  Distal  pulses are intact.  No edema.  Neuro nonfocal.  Skin warm and dry.  No  muscular  weakness.   IMPRESSION:  1. Hypertension, diuretic to be added.  Follow up in 8 weeks.  Check      BMET in 4 weeks.  2. Hyperlipidemia.  Follow up with Dr. Timothy Lasso.  Continue Zetia and      WelChol.  3. Recent sinus infection seems to be improved.  Follow up with Dr.      Timothy Lasso.  Overall, I think      Alejandro [name] is doing fairly well.  If he could lose another 10      pounds, I think his blood pressure would be much easier to control.     Noralyn Pick. Eden Emms, MD, [facility]  Electronically Signed    PCN/MedQ  DD: 05/18/2008  DT: 05/18/2008  Job #: [id number]   cc:   Gwen Pounds, MD

## 2010-12-17 NOTE — Assessment & Plan Note (Signed)
[facility]ALTHCARE                            CARDIOLOGY OFFICE NOTE   Alejandro [name], RUDDY                MRN:          [id number]  DATE:07/10/2008                            DOB:          1956/03/13    Alejandro [name] returns today for followup.  He is still under a little bit  of stress.  He is going through divorce.  He has a son who is up at  [facility] now and is doing fairly well.  He has been compliant with his  blood pressure medicines, addition of low-dose diuretic seems to have  helped.  He works out on a daily basis.  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  He has been compliant with his meds.  He also has  hypercholesterolemia.  He has had significant myalgias on statins and is  only on Zetia.   CURRENT MEDICATIONS:  1. Zetia 10 a day.  2. An aspirin a day.  3. Welchol.  4. Atenolol 25 a day.  5. Benicar/hydrochlorothiazide 40/25.  6. Norvasc 10 a day.   PHYSICAL EXAMINATION:  GENERAL:  Remarkable for a middle-aged male in no  distress.  VITAL SIGNS:  Weight is 205, blood pressure is 150/80, pulse 54 and  regular, respiratory rate 14, afebrile.  HEENT:  Unremarkable.  NECK:  Carotids are normal without bruit.  No lymphadenopathy,  thyromegaly, or JVP elevation.  LUNGS:  Clear.  Good diaphragmatic motion.  No wheezing.  CARDIAC:  S1, S2 with normal heart sounds.  PMI normal.  ABDOMEN:  Benign.  Bowel sounds positive.  No AAA.  No tenderness.  No  bruit.  No hepatosplenomegaly or hepatojugular reflux.  No tenderness.  EXTREMITIES:  Distal pulses are intact.  No edema.  NEUROLOGIC:  Nonfocal.  SKIN:  Warm and dry.  MUSCULOSKELETAL:  No muscular weakness.   IMPRESSION:  1. Hypertension, currently fairly well controlled.  Continue to      monitor at home, maintain on four-drug therapy for now.  2. Hypercholesterolemia, continue Zetia.  Lipid and liver profile in 6      months.  3. Anxiety and stress secondary to divorce.  Consider SSRI in the      future.  Follow up with primary care MD for this.  I will see him      back in 6 months.     Noralyn Pick. Eden Emms, MD, [facility]  Electronically Signed   PCN/MedQ  DD: 07/10/2008  DT: 07/10/2008  Job #: [id number]

## 2010-12-17 NOTE — Cardiovascular Report (Signed)
NAMEJAYVIER, BURGHER       ACCOUNT NO.:  [id number]   MEDICAL RECORD NO.:  [id number]          PATIENT TYPE:  OIB   LOCATION:  1966                         FACILITY:  [facility]   PHYSICIAN:  Peter C. Eden Emms, MD, FACCDATE OF BIRTH:  14-Dec-1955   DATE OF PROCEDURE:  10/13/2007  DATE OF DISCHARGE:  10/13/2007                            CARDIAC CATHETERIZATION   PROCEDURE:  Coronary arteriography.   INDICATIONS:  Chest pain and abnormal Myoview suggesting previous  inferior wall infarct, markedly positive family history for coronary  artery disease and hyperlipidemia.   Cine catheterization was done with 5-French and 4-French catheters from  the right femoral artery.   Left main coronary artery was normal.   Left anterior descending artery had 20-30% multiple discrete lesions in  the proximal portion.  The mid and distal LAD were normal.   First diagonal branch was a medium-sized vessel with 30% multiple  discrete lesions.  Second diagonal branch had 30% somewhat calcified  disease proximally,   Circumflex coronary artery was nondominant.  The proximal circumflex  coronary artery was normal.  There was a large first obtuse marginal  branch with 30-40% proximal disease.  The distal circumflex coronary  artery was normal.   There were left-to-right collaterals from what appeared to be the AV  groove and septal perforators to the distal right.   The right coronary artery was 100% occluded in the proximal portion and  there were fairly advanced right-to-right bridging collaterals to the  mid and distal RCA.   There was competitive flow from the left-to-right collaterals.   RAO ventriculography:  RAO Ventriculography was essentially normal.  There may have been minimal inferobasal wall hypokinesis.  EF was 55%.  There was small gradient across the aortic valve.  The patient has mild  aortic stenosis by echo.  I believe his mean gradient was 13 mmHg with a  peak gradient of 29  mmHg.  This will have to be followed.  LV pressure  was 133/62.  Aortic pressure was 120/81, giving approximately a 13 mm  peak-to-peak gradient across the aortic valve.   IMPRESSION:  Mild aortic stenosis with single-vessel coronary disease,  100% occluded right with advanced right-to-right and left-to-right  collaterals.  Medical therapy is warranted.   The patient tolerated the procedure well.      Noralyn Pick. Eden Emms, MD, [facility]  Electronically Signed     PCN/MEDQ  D:  10/13/2007  T:  10/14/2007  Job:  [id number]   cc:   Gwen Pounds, MD

## 2010-12-17 NOTE — Cardiovascular Report (Signed)
NAMEESTIBEN, MIZUNO       ACCOUNT NO.:  [id number]   MEDICAL RECORD NO.:  [id number]          PATIENT TYPE:  OIB   LOCATION:  1966                         FACILITY:  [facility]   PHYSICIAN:  Peter C. Eden Emms, MD, FACCDATE OF BIRTH:  02/10/1956   DATE OF PROCEDURE:  10/13/2007  DATE OF DISCHARGE:  10/13/2007                            CARDIAC CATHETERIZATION   ADDENDUM:  The patient has a family history of distal aortic aneurysms.  We did have a bit of difficulty negotiating his distal aorta with our  wire and left catheter.  At the end of the case we did a distal AP  aortography.   The patient does have a tiny infrarenal abdominal aortic aneurysm.  This  will be followed by ultrasound.      Noralyn Pick. Eden Emms, MD, [facility]  Electronically Signed     PCN/MEDQ  D:  10/13/2007  T:  10/13/2007  Job:  [id number]

## 2010-12-17 NOTE — Assessment & Plan Note (Signed)
[facility] HEALTHCARE                            CARDIOLOGY OFFICE NOTE   Alejandro, [name]                 MRN:          [id number]  DATE:11/09/2007                            DOB:          1956/05/28    HISTORY OF PRESENT ILLNESS:  Alejandro [name] returns today follow-  up.  He has single-vessel disease with right coronary artery that is  collateralized.  He has good LV function.  He has mild AS and  hypertension.  I talked him at length about his prognoses.  I think he  should be fine.  I suspect that some time in his early 32s, he may need  a combined procedure first coronary disease and aortic valve.  He has  been little bit stress lately.  He has begun his rehab.  He has been  intolerant to statins in the past and Dr. Timothy Lasso is trying him on 10 mg  of Crestor once a week.   His risks factors are otherwise well modified.  He seems motivated to  exercise.   REVIEW OF SYSTEMS:  Remarkable for bit of bruising after his heart cath  which seems to be healing well now.   MEDICATIONS:  1. Diovan 320 day.  2. Zetia 10 a day.  3. Welchol 650 a day.  4. Multivitamins.  5. Atenolol 25 a day.  6. Crestor 10 weekly.   PHYSICAL EXAMINATION:  GENERAL:  Remarkable for healthy-appearing middle-  aged white male in no distress.  VITAL SIGNS:  Blood pressure is 140/80, pulse 80 and regular, weight is  210, afebrile, respiratory rate 14.  HEENT:  Unremarkable.  Carotids normal without bruit, no  lymphadenopathy, thyromegaly JVP elevation.  LUNGS:  Clear with diaphragmatic motion.  No wheezing.  HEART:  S1-S2 normal.  Heart sounds normal.  PMI normal.  ABDOMEN:  Benign.  Bowel sounds positive.  No AAA.  No tenderness, no  bruit, no hepatosplenomegaly, no hepatojugular reflux.  Cath site is  well-healed with no residual bruit, no hematoma.  EXTREMITIES:  Distal pulses intact, no edema.  NEUROLOGICAL:  Nonfocal.  SKIN:  Warm and dry.  No muscle  weakness.   IMPRESSION:  1. Coronary disease, single-vessel collateralized continue aspirin and      beta blocker.  2. Mild aortic stenosis.  Follow-up echo in 6 months.  No need for SP      prophylaxis.  3. Hypercholesterolemia.  Follow-up with Dr. Timothy Lasso, Crestor 10 once      week to see if he tolerates it.  Continue Welchol, diet and Zetia.  4. Hypertension currently well controlled.  Continue diet and low-salt      diet.  I will see Alejandro [name] back in about 3 months.     Noralyn Pick. Eden Emms, MD, [facility]  Electronically Signed    PCN/MedQ  DD: 11/09/2007  DT: 11/09/2007  Job #: [id number]

## 2010-12-17 NOTE — Assessment & Plan Note (Signed)
[facility]ALTHCARE                            CARDIOLOGY OFFICE NOTE   COLE, EASTRIDGE                 MRN:          [id number]  DATE:10/07/2007                            DOB:          [birth year]    Mr. Alejandro [name] is a 55 year old patient referred by Dr. Timothy Lasso.  The  patient had an episode of chest pain a week or 2 ago.  He was under a  lot of stress.  He had substernal pain.  It was sharp.  It radiates  through his back.  It lasted for about an hour.  He went to the  emergency room.  He ruled out myocardial infarction.  He was referred  for a Myoview study.  He has not had a recurrence of his pain.  His  Myoview study, however, was read by Dr. Tenny Craw as abnormal.  I reviewed  the images and, indeed, he has quite a defect in the inferior basal wall  with minimal redistribution.  It is a little bit more than one would  normally see for diaphragmatic attenuation.  His EF was 56%.  He was  referred here to talk about further workup.  I talked to Dr. Gwendolyn Lima  and showed his images.  I suspect his pain had to do with previous  history of panic attacks and his stress level.  However, his Myoview  studies are quite abnormal.  He had a follow-up echocardiogram to assess  the inferior wall and he did not have a regional wall motion  abnormality.  His EF was 70% and his valves were fine.  In talking to  [facility], I gave him his options in terms of watchful waiting, trying  to get his insurance company to cover a cardiac CT, or proceeding with  diagnostic heart cath.  The risks of cath including stroke were  discussed.  He wanted to proceed with catheterization as soon as  possible to settle the issue as to whether he had coronary disease.   PAST MEDICAL HISTORY:  Relatively benign.  He does have history of panic  attacks, history of lumbar laminectomy by Dr. Elesa Hacker many years ago.  He  has had hypertension, hyperlipidemia.  He was recently started on  atenolol.  He is allergic to CODEINE and DEMEROL.   FAMILY HISTORY:  Is remarkable for high blood pressure but no premature  coronary disease.   The patient does executive searches for client companies.  He is under a  lot of stress.  He is currently separated.  He has an 54 year old son  who is doing well.  He does not exercise on a regular basis.  He quit  smoking in 2001 and does drink on occasion.   REVIEW OF SYSTEMS:  Otherwise negative.   EXAM:  Remarkable for a healthy-appearing middle-aged white male in no  distress.  His blood pressure is 140/82, weight 210, pulse 80, respiratory 14,  afebrile.  HEENT:  Unremarkable.  Carotids are without bruit.  No lymphadenopathy, thyromegaly JVP  elevation.  LUNGS:  Clear with good diaphragmatic motion.  No wheezing.  S1-S2 normal heart sounds.  PMI normal.  ABDOMEN:  Benign.  Bowel sounds positive.  No AAA.  No tenderness.  No  hepatosplenomegaly or hepatojugular reflux.  Distal pulses intact, no edema.  NEURO:  Nonfocal.  Skin is warm and dry.  No muscular weakness.   Baseline EKG shows sinus rhythm with left atrial enlargement and  borderline LVH.   MEDICATIONS:  1. Atenolol 25 a day.  2. Multivitamins.  3. Welchol 650 six pills daily.  4. An aspirin a day.  5. Zetia 10 a day.  6. Diovan 320 a day.   IMPRESSION:  1. Chest pain with abnormal Myoview.  Diagnostic cath to be performed.      Labs and chest x-ray today.  2. Hypertension.  Continue low-salt diet, Diovan, and atenolol.  3. Hypercholesterolemia.  Continue Welchol and Zetia.  4. Anxiety.  This is clearly stress-related.  Dr. Timothy Lasso may want to      think about starting an SSRI agent.   Further recommendations will be based on the results of his heart cath.     Noralyn Pick. Eden Emms, MD, [facility]  Electronically Signed    PCN/MedQ  DD: 10/07/2007  DT: 10/07/2007  Job #: [id number]

## 2010-12-17 NOTE — Assessment & Plan Note (Signed)
[facility] HEALTHCARE                            CARDIOLOGY OFFICE NOTE   ANASTACIO, BUA                MRN:          [id number]  DATE:02/11/2008                            DOB:          Jun 22, 1956    The patient returns today for followup of his coronary artery disease.  He has a total right that is collateralized, no left-sided disease, and  he is not having significant chest pain.  He has mild aortic stenosis.   His last echocardiogram showed a mean gradient of 13 with a peak  gradient of 29.   His blood pressure has been labile.  He tells me that he just had it  checked at Dr. Ferd Hibbs office and in general, it runs less than 150  systolic and less than 90 diastolic.  However, in our office, it tends  to be elevated.  I told him to go and get a blood pressure monitor and  see me back in 3 months and we will see what he tends to run at home.  He has been compliant with his medicines.  He is currently on Diovan,  but is thinking of switching to Benicar for cost reasons.   Risk factor modification is difficult.  He is intolerant to statins.  He  is trying to take Crestor once a week.  He continues to have some muscle  aches and pains, otherwise he is compliant with the Zetia and WelChol.  His last LDL cholesterol was still 409.   He is working through these issues with Dr. Timothy Lasso.   REVIEW OF SYSTEMS:  Otherwise, negative.   ALLERGIES:  He is allergic to CODEINE and DEMEROL.   CURRENT MEDICATIONS:  1. Diovan 320 a day.  2. Zetia 10 a day.  3. Aspirin.  4. WelChol 650 a day.  5. Multivitamins.  6. Atenolol 25 a day.  7. Crestor 10 mg once a week.   PHYSICAL EXAMINATION:  GENERAL:  Remarkable for well-tanned white male,  in no distress.  VITAL SIGNS:  Weight is 203.  Blood pressure is 170/90, pulse 52 and  regular, respiratory rate 14, and afebrile.  HEENT:  Unremarkable.  NECK:  Carotids are normal without bruit.  No lymphadenopathy,  thyromegaly, or JVP elevation.  There is no parvus and no tardus.  LUNGS:  Clear.  Good diaphragmatic motion.  No wheezing.  HEART:  S1 and S2 with a mild AS murmur.  PMI normal.  ABDOMEN:  Benign.  Bowel sounds positive.  No AAA.  No tenderness.  No  bruit.  No hepatosplenomegaly.  No hepatojugular reflux.  EXTREMITIES:  Distal pulses are intact.  No edema.  NEUROLOGIC:  Nonfocal.  SKIN:  Warm and dry.  MUSCULOSKELETAL:  No muscular weakness.   IMPRESSION:  1. Hypertension, question white coat component.  Continue Diovan and      beta-blocker.  The patient to monitor his blood pressure at home.      Consider addition of calcium blocker should his blood pressures run      high.  Continue low-salt diet.  2. Hypercholesterolemia.  Follow up with Dr. Timothy Lasso.  We are doing  the      best we can given his intolerance to statins.  Low-cholesterol      diet.  3. Coronary artery disease with collateralized right.  4. Followup Myoview in a year.  No significant left-sided disease.  5. Mild aortic stenosis.  Followup echo in a year.  Currently      asymptomatic.   I will see him back in 3 months to reassess his blood pressure.     Noralyn Pick. Eden Emms, MD, [facility]  Electronically Signed    PCN/MedQ  DD: 02/11/2008  DT: 02/11/2008  Job #: [id number]

## 2010-12-20 NOTE — Assessment & Plan Note (Signed)
[facility] HEALTHCARE                            CARDIOLOGY OFFICE NOTE   Alejandro [name], Alejandro [name]                 MRN:          [id number]  DATE:11/09/2007                            DOB:          1955-12-20    HISTORY OF PRESENT ILLNESS:  Alejandro [name] returns today follow-  up.  He has single-vessel disease with right coronary artery that is  collateralized.  He has good LV function.  He has mild AS and  hypertension.  I talked him at length about his prognoses.  I think he  should be fine.  I suspect that some time in his early 80s, he may need  a combined procedure first coronary disease and aortic valve.  He has  been little bit stress lately.  He has begun his rehab.  He has been  intolerant to statins in the past and Dr. Timothy Lasso is trying him on 10 mg  of Crestor once a week.   His risks factors are otherwise well modified.  He seems motivated to  exercise.   REVIEW OF SYSTEMS:  Remarkable for bit of bruising after his heart cath  which seems to be healing well now.   MEDICATIONS:  1. Diovan 320 day.  2. Zetia 10 a day.  3. Welchol 650 a day.  4. Multivitamins.  5. Atenolol 25 a day.  6. Crestor 10 weekly.   PHYSICAL EXAMINATION:  GENERAL:  Remarkable for healthy-appearing middle-  aged white male in no distress.  VITAL SIGNS:  Blood pressure is 140/80, pulse 80 and regular, weight is  210, afebrile, respiratory rate 14.  HEENT:  Unremarkable.  Carotids normal without bruit, no  lymphadenopathy, thyromegaly JVP elevation.  LUNGS:  Clear with diaphragmatic motion.  No wheezing.  HEART:  S1-S2 normal.  Heart sounds normal.  PMI normal.  ABDOMEN:  Benign.  Bowel sounds positive.  No AAA.  No tenderness, no  bruit, no hepatosplenomegaly, no hepatojugular reflux.  Cath site is  well-healed with no residual bruit, no hematoma.  EXTREMITIES:  Distal pulses intact, no edema.  NEUROLOGICAL:  Nonfocal.  SKIN:  Warm and dry.  No muscle  weakness.   IMPRESSION:  1. Coronary disease, single-vessel collateralized continue aspirin and      beta blocker.  2. Mild aortic stenosis.  Follow-up echo in 6 months.  No need for SP      prophylaxis.  3. Hypercholesterolemia.  Follow-up with Dr. Timothy Lasso, Crestor 10 once      week to see if he tolerates it.  Continue Welchol, diet and Zetia.  4. Hypertension currently well controlled.  Continue diet and low-salt      diet.  I will see Alejandro [name] back in about 3 months.     Noralyn Pick. Eden Emms, MD, [facility]     PCN/MedQ  DD: 11/09/2007  DT: 11/09/2007  Job #: [id number]

## 2011-04-25 LAB — POCT CARDIAC MARKERS
CKMB, poc: 2.9
Myoglobin, poc: 112
Operator id: 284251
Troponin i, poc: <0.05

## 2011-06-11 ENCOUNTER — Encounter: Payer: Self-pay | Admitting: Cardiovascular Disease

## 2011-12-10 ENCOUNTER — Encounter: Payer: Self-pay | Admitting: Cardiovascular Disease

## 2012-01-27 ENCOUNTER — Encounter: Payer: Self-pay | Admitting: *Deleted

## 2012-01-28 ENCOUNTER — Encounter: Payer: Self-pay | Admitting: Cardiovascular Disease

## 2012-01-28 ENCOUNTER — Ambulatory Visit (INDEPENDENT_AMBULATORY_CARE_PROVIDER_SITE_OTHER): Payer: BC Managed Care – PPO | Admitting: Cardiovascular Disease

## 2012-01-28 VITALS — BP 144/90 | HR 59 | Ht 70.0 in | Wt 228.8 lb

## 2012-01-28 DIAGNOSIS — I359 Nonrheumatic aortic valve disorder, unspecified: Secondary | ICD-10-CM

## 2012-01-28 DIAGNOSIS — I2510 Atherosclerotic heart disease of native coronary artery without angina pectoris: Secondary | ICD-10-CM

## 2012-01-28 DIAGNOSIS — E7800 Pure hypercholesterolemia, unspecified: Secondary | ICD-10-CM

## 2012-01-28 DIAGNOSIS — I10 Essential (primary) hypertension: Secondary | ICD-10-CM

## 2012-01-28 MED ORDER — NITROGLYCERIN 0.4 MG SL SUBL: 0.4000 mg | SUBLINGUAL_TABLET | SUBLINGUAL | Status: DC | PRN

## 2012-01-28 NOTE — Progress Notes (Signed)
Patient ID: Alejandro [name], male   DOB: 1956-06-24, 56 y.o.   MRN: [id number] This is a 56 year old white male patient with history of coronary artery disease. He had a cardiac catheterization in 2009 which revealed a collateralized right coronary artery without left disease. This was performed after an abnormal Myoview that showed inferior scar but no ischemia. The patient also has history of alcohol abuse and cocaine use in the past. He currently drinks 2 cups one a day but denies cocaine use. Acitve sees Timothy Lasso.  Intolerant to statins.  Needs nitor.  Mild abdominal aortic dilatatin at cath recent US at [facility] ok per patient  Has a fishing trip to [place] planned soon   ROS: Denies fever, malais, weight loss, blurry vision, decreased visual acuity, cough, sputum, SOB, hemoptysis, pleuritic pain, palpitaitons, heartburn, abdominal pain, melena, lower extremity edema, claudication, or rash.  All other systems reviewed and negative  General: Affect appropriate Healthy:  appears stated age HEENT: normal Neck supple with no adenopathy JVP normal no bruits no thyromegaly Lungs clear with no wheezing and good diaphragmatic motion Heart:  S1/S2 midl AS murmur, no rub, gallop or click PMI normal Abdomen: benighn, BS positve, no tenderness, no AAA no bruit.  No HSM or HJR Distal pulses intact with no bruits No edema Neuro non-focal Skin warm and dry No muscular weakness   Current Outpatient Prescriptions  Medication Sig Dispense Refill  . amLODipine (NORVASC) 10 MG tablet Take 10 mg by mouth daily.      Marland Kitchen aspirin 81 MG tablet Take 81 mg by mouth daily.      Marland Kitchen atenolol (TENORMIN) 25 MG tablet Take 25 mg by mouth daily.      . colesevelam (WELCHOL) 625 MG tablet Take 1,875 mg by mouth daily.      Marland Kitchen ezetimibe (ZETIA) 10 MG tablet Take 10 mg by mouth daily.      Marland Kitchen olmesartan-hydrochlorothiazide (BENICAR HCT) 40-25 MG per tablet Take 1 tablet by mouth daily.         Allergies  Codeine  Electrocardiogram:  NSR rate 59 LVH otherwise normal  Assessment and Plan

## 2012-01-28 NOTE — Assessment & Plan Note (Signed)
Well controlled.  Continue current medications and low sodium Dash type diet.    

## 2012-01-28 NOTE — Addendum Note (Signed)
Addended by: Scherrie Bateman E on: 01/28/2012 05:09 PM   Modules accepted: Orders

## 2012-01-28 NOTE — Addendum Note (Signed)
Addended by: Scherrie Bateman E on: 01/28/2012 04:51 PM   Modules accepted: Orders

## 2012-01-28 NOTE — Patient Instructions (Signed)
Your physician wants you to follow-up in:  6 MONTHS WITH DR NISHAN  You will receive a reminder letter in the mail two months in advance. If you don't receive a letter, please call our office to schedule the follow-up appointment. Your physician recommends that you continue on your current medications as directed. Please refer to the Current Medication list given to you today. 

## 2012-01-28 NOTE — Assessment & Plan Note (Signed)
Cholesterol is at goal.  Continue current dose of statin and diet Rx.  No myalgias or side effects.  F/U  LFT's in 6 months. No results found for this basename: LDLCALC             

## 2012-01-28 NOTE — Assessment & Plan Note (Signed)
No chest pain  Consider F/U CT if chest pain.  Nitro called in

## 2012-01-28 NOTE — Assessment & Plan Note (Signed)
Murmur is still mild with preserved S2  Consier echo in a year

## 2012-03-30 ENCOUNTER — Telehealth: Payer: Self-pay | Admitting: Cardiovascular Disease

## 2012-03-30 NOTE — Telephone Encounter (Signed)
Pt needs to stop asa in order to get injection they faxed form on 8/22 and still has not heard anything

## 2012-03-30 NOTE — Telephone Encounter (Signed)
Spoke to [employer] at [employer] office she stated she needed to know if ok for patient to hold aspirin for a epidural steroid injection.Dr.Nishan not in office today spoke to DOD Dr.Brackbill he advised okay to hold aspirin.

## 2013-01-25 ENCOUNTER — Encounter: Payer: Self-pay | Admitting: Neurology

## 2013-01-25 ENCOUNTER — Ambulatory Visit (INDEPENDENT_AMBULATORY_CARE_PROVIDER_SITE_OTHER): Payer: BC Managed Care – PPO | Admitting: Neurology

## 2013-01-25 VITALS — BP 156/98 | HR 59 | Temp 97.8°F | Ht 70.0 in | Wt 232.0 lb

## 2013-01-25 DIAGNOSIS — I10 Essential (primary) hypertension: Secondary | ICD-10-CM

## 2013-01-25 DIAGNOSIS — G4733 Obstructive sleep apnea (adult) (pediatric): Secondary | ICD-10-CM

## 2013-01-25 NOTE — Progress Notes (Signed)
Subjective:    [name] ID: Alejandro [name] is a 57 y.o. male.  HPI  Alejandro Foley, MD, PhD [facility] [address], [address] [address] [place], [place] [address]  Dear Dr. Timothy [name],  I saw your [name], Alejandro [name], upon your kind request in my neurologic clinic today for initial consultation of his sleep disorder, in particular, concern for obstructive sleep apnea. Alejandro [name] is unaccompanied today. As you know, Alejandro [name] is a very pleasant 57 year old right-handed gentleman with an underlying medical history of hypertension, hyperlipidemia, heart disease and prior substance abuse, who has been complaining of excessive daytime somnolence for quite some time. Alejandro [name] has been known to snore. Alejandro [name] reports having had a sleep study some 10 years ago and had seen an ENT, who suggested sleep apnea surgery, but Alejandro [name] did not go through with it. In Alejandro last 2 weeks, Alejandro [name] has had significant daytime tiredness, but denies frank sleepiness. Alejandro [name] has had bloodwork recently on 6/19, which I reviewed. Alejandro [name] had vit D and B12 on Alejandro lower end of Alejandro spectrum and negative mononucleosis test.   Alejandro [name] has been gaining weight.   His typical bedtime is reported to be around 9 PM and 10 PM, and his usual wake time is around 6:45 AM. Sleep onset typically occurs within a few minutes. Alejandro [name] reports feeling not rested upon awakening. Alejandro [name] wakes up on an average 4 or 5 in Alejandro middle of Alejandro night and has to go to Alejandro bathroom 2 or 3 times on a typical night. Alejandro [name] morning headaches.  Alejandro [name] reports excessive daytime somnolence (EDS) and His Epworth Sleepiness Score (ESS) is 5/24 today. Alejandro [name] has not fallen asleep while driving.   Alejandro [name] has been known to snore for Alejandro past many years. Snoring is reportedly moderate to loud, and associated with choking sounds and witnessed apneas. Alejandro [name] admits to a sense of choking or strangling feeling. There is no report of nighttime reflux, with rare nighttime cough experienced.  Alejandro [name] has not noted any RLS symptoms and is not known to kick while asleep or before falling asleep.    Alejandro [name] denies cataplexy, sleep paralysis, hypnagogic or hypnopompic hallucinations, or sleep attacks. Alejandro [name] does not report any vivid dreams, nightmares, dream enactments, or parasomnias, such as sleep talking or sleep walking.   His Past Medical History Is Significant For: Past Medical History  Diagnosis Date  . HYPERCHOLESTEROLEMIA   . ANXIETY   . CAD   . AORTIC STENOSIS, MILD   . OSTEOARTHRITIS, KNEE   . INSOMNIA   . DYSPNEA   . CHEST PAIN-UNSPECIFIED   . Hypertension     His Past Surgical History Is Significant For: Past Surgical History  Procedure Laterality Date  . Lumbar laminectomy  Late 1990's    His Family History Is Significant For: History reviewed. No pertinent family history.  His Social History Is Significant For: History   Social History  . Marital Status: Divorced    Spouse Name: N/A    Number of Children: N/A  . Years of Education: N/A   Social History Main Topics  . Smoking status: Light Tobacco Smoker    Types: Cigars  . Smokeless tobacco: None  . Alcohol Use: 6.0 oz/week    12 drink(s) per week  . Drug Use: No  . Sexually Active: None   Other Topics Concern  . None   Social History Narrative  . None    His Allergies Are:  Allergies  Allergen Reactions  .  Codeine     REACTION: Nausea  :   His Current Medications Are:  Outpatient Encounter Prescriptions as of 01/25/2013  Medication Sig Dispense Refill  . amLODipine (NORVASC) 10 MG tablet Take 10 mg by mouth daily.      Marland Kitchen aspirin 81 MG tablet Take 81 mg by mouth daily.      . colesevelam (WELCHOL) 625 MG tablet Take 1,875 mg by mouth daily.      Marland Kitchen ezetimibe (ZETIA) 10 MG tablet Take 10 mg by mouth daily.      . nebivolol (BYSTOLIC) 5 MG tablet Take 5 mg by mouth at bedtime.      Marland Kitchen olmesartan-hydrochlorothiazide (BENICAR HCT) 40-25 MG per tablet Take 1 tablet by mouth daily.      .  [DISCONTINUED] atenolol (TENORMIN) 25 MG tablet Take 25 mg by mouth daily.      . [DISCONTINUED] nitroGLYCERIN (NITROSTAT) 0.4 MG SL tablet Place 1 tablet (0.4 mg total) under Alejandro tongue every 5 (five) minutes as needed for chest pain.  25 tablet  4  . [DISCONTINUED] nitroGLYCERIN (NITROSTAT) 0.4 MG SL tablet Place 1 tablet (0.4 mg total) under Alejandro tongue every 5 (five) minutes as needed for chest pain.  25 tablet  4   No facility-administered encounter medications on file as of 01/25/2013.   Review of Systems  Constitutional: Positive for fatigue.  Respiratory:       Snoring  Musculoskeletal: Positive for myalgias.  Allergic/Immunologic: Positive for environmental allergies.  Neurological: Positive for headaches.  Psychiatric/Behavioral: Positive for sleep disturbance.       Sleepiness    Objective:  Neurologic Exam  Physical Exam Physical Examination:   Filed Vitals:   01/25/13 1049  BP: 156/98  Pulse: 59  Temp: 97.8 F (36.6 C)    General Examination: Alejandro [name] is a very pleasant 57 y.o. male in no acute distress. Alejandro [name] appears well-developed and well-nourished and very well groomed.   HEENT: Normocephalic, atraumatic, pupils are equal, round and reactive to light and accommodation. Extraocular tracking is good without limitation to gaze excursion or nystagmus noted. Normal smooth pursuit is noted. Hearing is grossly intact. Tympanic membranes are clear bilaterally. Face is symmetric with normal facial animation and normal facial sensation. Speech is clear with no dysarthria noted. There is no hypophonia. There is no lip, neck/head, jaw or voice tremor. Neck is supple with full range of passive and active motion. There are no carotid bruits on auscultation. Oropharynx exam reveals: mild mouth dryness, good dental hygiene and moderate airway crowding, due to significantly enlarged uvula which also appears irritated and swollen. His soft palate is also thick and redundant appearing.  Mallampati is class III. Tongue protrudes centrally and palate elevates symmetrically. Tonsils are 1+. Neck size is 18.75 inches.   Chest: Clear to auscultation without wheezing, rhonchi or crackles noted.  Heart: S1+S2+0, regular and normal without murmurs, rubs or gallops noted.   Abdomen: Soft, non-tender and non-distended with normal bowel sounds appreciated on auscultation.  Extremities: There is no pitting edema in Alejandro distal lower extremities bilaterally. Pedal pulses are intact.  Skin: Warm and dry without trophic changes noted. There are no varicose veins.  Musculoskeletal: exam reveals no obvious joint deformities, tenderness or joint swelling or erythema.   Neurologically:  Mental status: Alejandro [name] is awake, alert and oriented in all 4 spheres. His memory, attention, language and knowledge are appropriate. There is no aphasia, agnosia, apraxia or anomia. Speech is clear with normal prosody and enunciation. Thought  process is linear. Mood is congruent and affect is normal.  Cranial nerves are as described above under HEENT exam. In addition, shoulder shrug is normal with equal shoulder height noted. Motor exam: Normal bulk, strength and tone is noted. There is no drift, tremor or rebound. Romberg is negative. Reflexes are 2+ throughout. Toes are downgoing bilaterally. Fine motor skills are intact with normal finger taps, normal hand movements, normal rapid alternating patting, normal foot taps and normal foot agility.  Cerebellar testing shows no dysmetria or intention tremor on finger to nose testing. Heel to shin is unremarkable bilaterally. There is no truncal or gait ataxia.  Sensory exam is intact to light touch, pinprick, vibration, temperature sense and proprioception in Alejandro upper and lower extremities.  Gait, station and balance are unremarkable. No veering to one side is noted. No leaning to one side is noted. Posture is age-appropriate and stance is narrow based. No problems  turning are noted. Alejandro [name] turns en bloc. Tandem walk is unremarkable. Intact toe and heel stance is noted.               Assessment and Plan:   In summary, Alejandro [name] is a very pleasant 57 y.o.-year old male with a history and physical exam concerning for obstructive sleep apnea (OSA). I had a long chat with Alejandro [name] about my findings and Alejandro diagnosis, its prognosis and treatment options. We talked about medical treatments and non-pharmacological approaches. I explained in particular Alejandro risks and ramifications of untreated moderate to severe OSA, especially with respect to developing cardiovascular disease down Alejandro Road, including congestive heart failure, difficult to treat hypertension, cardiac arrhythmias, or stroke. Even type 2 diabetes has in part been linked to untreated OSA. We talked about trying to maintain a healthy lifestyle in general, as well as Alejandro importance of weight control. I encouraged Alejandro [name] to eat healthy, exercise daily and keep well hydrated, to keep a scheduled bedtime and wake time routine, to not skip any meals and eat healthy snacks in between meals.  I recommended Alejandro following at this time: sleep study, possibly with CPAP.  I explained Alejandro sleep test procedure to Alejandro [name] and also outlined surgical and non-surgical treatment options of OSA including Alejandro use of a dental custom-made appliance, upper airway surgery such as pillar implants, radiofrequency surgery, tongue base surgery, and UPPP. I also explained Alejandro CPAP treatment option to Alejandro [name], who indicated that Alejandro [name] would be willing to try CPAP if Alejandro need arises. I explained Alejandro importance of being compliant with PAP treatment, not only for insurance purposes but primarily for Alejandro [name]'s long term health benefit. I answered all his questions today and Alejandro [name] was in agreement. I would like to see him back in after Alejandro sleep study.  I encouraged him to call with any interim questions, concerns,  problems or updates.   Thank you very much for allowing me to participate in Alejandro care of this nice [name]. If I can be of any further assistance to you please do not hesitate to call me at [phone].  Sincerely,   Alejandro Foley, MD, PhD

## 2013-01-25 NOTE — Patient Instructions (Addendum)

## 2013-01-31 ENCOUNTER — Ambulatory Visit (INDEPENDENT_AMBULATORY_CARE_PROVIDER_SITE_OTHER): Payer: BC Managed Care – PPO | Admitting: Neurology

## 2013-01-31 DIAGNOSIS — I10 Essential (primary) hypertension: Secondary | ICD-10-CM

## 2013-01-31 DIAGNOSIS — G479 Sleep disorder, unspecified: Secondary | ICD-10-CM

## 2013-01-31 DIAGNOSIS — G4761 Periodic limb movement disorder: Secondary | ICD-10-CM

## 2013-01-31 DIAGNOSIS — G4733 Obstructive sleep apnea (adult) (pediatric): Secondary | ICD-10-CM

## 2013-02-11 ENCOUNTER — Telehealth: Payer: Self-pay | Admitting: Neurology

## 2013-02-11 DIAGNOSIS — G4733 Obstructive sleep apnea (adult) (pediatric): Secondary | ICD-10-CM

## 2013-02-11 DIAGNOSIS — G4761 Periodic limb movement disorder: Secondary | ICD-10-CM

## 2013-02-11 NOTE — Telephone Encounter (Signed)
Please call and notify patient that the recent sleep study confirmed the diagnosis of OSA. He did well with CPAP during the study with moderate improvement of the respiratory events. Therefore, I would like start the patient on CPAP at home. I placed the order in the chart.   Arrange for CPAP set up at home through a DME company of patient's choice and fax/route report to PCP and referring MD (if other than PCP).   The patient will also need a follow up appointment with me in 6-8 weeks post set up that has to be scheduled; help the patient schedule this (in a follow-up slot).   Please re-enforce the importance of compliance with treatment and the need for [place] to monitor compliance data.   Once you have spoken to the patient and scheduled the return appointment, you may close this encounter, thanks,   Huston Foley, MD, PhD [facility])   PS: it looks like it's his [birth year] today!

## 2013-02-23 ENCOUNTER — Encounter: Payer: Self-pay | Admitting: *Deleted

## 2013-02-23 NOTE — Telephone Encounter (Signed)
Left message for patient regarding sleep study results, asked patient to call me back to discuss results and have questions answered.  Explained that a copy of the sleep study was sent to referring physician and copy of study is coming to them in the mail.  Explained orders would be sent to a DME company and he will be contacted by them next.  I will enclose a follow up letter with the DME contact info as well as when the patient needs to follow up with Dr. Frances Furbish in clinic.  DME orders will be forwarded to [facility] today by fax.  Copy of study faxed to Dr. Timothy Lasso today.

## 2013-05-27 ENCOUNTER — Encounter: Payer: Self-pay | Admitting: Neurology

## 2013-05-31 ENCOUNTER — Encounter: Payer: Self-pay | Admitting: Neurology

## 2013-05-31 ENCOUNTER — Ambulatory Visit (INDEPENDENT_AMBULATORY_CARE_PROVIDER_SITE_OTHER): Payer: BC Managed Care – PPO | Admitting: Neurology

## 2013-05-31 VITALS — BP 152/92 | HR 59 | Temp 98.7°F | Ht 70.0 in | Wt 242.0 lb

## 2013-05-31 DIAGNOSIS — Z8679 Personal history of other diseases of the circulatory system: Secondary | ICD-10-CM

## 2013-05-31 DIAGNOSIS — Z9989 Dependence on other enabling machines and devices: Secondary | ICD-10-CM | POA: Insufficient documentation

## 2013-05-31 DIAGNOSIS — G4733 Obstructive sleep apnea (adult) (pediatric): Secondary | ICD-10-CM

## 2013-05-31 DIAGNOSIS — I10 Essential (primary) hypertension: Secondary | ICD-10-CM

## 2013-05-31 DIAGNOSIS — J309 Allergic rhinitis, unspecified: Secondary | ICD-10-CM

## 2013-05-31 HISTORY — DX: Obstructive sleep apnea (adult) (pediatric): Z99.89

## 2013-05-31 HISTORY — DX: Obstructive sleep apnea (adult) (pediatric): G47.33

## 2013-05-31 NOTE — Progress Notes (Signed)
Subjective:    Patient ID: Alejandro [name] is a 57 y.o. male.  HPI  Interim history:   Alejandro [name] is a very pleasant 57 year old right-handed gentleman with an underlying medical history of hypertension, hyperlipidemia, heart disease and prior substance abuse, who presents for followup consultation of his obstructive sleep apnea after his recent sleep study. He is unaccompanied today. I first met him on 01/25/2013 at which time I asked him to come back for sleep study. He has split-night sleep study on 01/31/2013 and I went over his test results with him in detail today. His baseline sleep efficiency was reduced at 58.2% with a latency to sleep of 12.5 minutes and wake after sleep onset of 46 minutes with severe sleep fragmentation noted. He had a markedly increased percentage of stage I sleep, and a reduced percentage of stage II sleep and absence of slow-wave and REM sleep prior to CPAP. He had a moderate to loud degree of snoring. His total AHI was 87.6 per hour. His baseline oxygen saturation was 89%, his nadir was 83%. He was then titrated on CPAP between 5-13 cm of water pressure. He a reduction of his AHI to 8.8 events per hour at a pressure of 13. He was noted to have mild periodic leg movements of sleep during the CPAP titration portion of the test but no significant arousals. His baseline oxygen saturation with CPAP improved to 93%. I also reviewed his compliance data from 03/04/2013 through 05/20/2013 which is a total of 78 days during which time he used it every day except for 4 days. His average daily usage is 4 hours and 57 minutes. Percent he is days greater than 4 hours was 69% which indicates fair compliance. His residual AHI was 9.9 per hour with a significant leak at times. His median leak was 12.6 his 95th percentile was 64.8 for leak. His that pressure is 13 with EPR of 2.  The most recent compliance data off of his machine was reviewed as well which included 03/04/2013  through 05/30/2013 which is a total of 88 days during which time he used the machine every day except for 4 days and his percent use days greater than 4 hours was again 69%. His average usage for all days was 5 hours. His residual AHI is again noted to be high at 10.4 with a significant leak at times. He reports having more issues with allergies since the CPAP, but overall feels, he sleeps much better. He tries to be fully compliant and has taken the mask of inadvertently. He is adjusting to the pressure and there is a lot of leak and he has had eye irritation in the morning, likely from air leaking into his eyes plus his allergies. He is going to see a new allergy doctor next.   His Past Medical History Is Significant For: Past Medical History  Diagnosis Date  . HYPERCHOLESTEROLEMIA   . ANXIETY   . CAD   . AORTIC STENOSIS, MILD   . OSTEOARTHRITIS, KNEE   . INSOMNIA   . DYSPNEA   . CHEST PAIN-UNSPECIFIED   . Hypertension     His Past Surgical History Is Significant For: Past Surgical History  Procedure Laterality Date  . Lumbar laminectomy  Late 1990's    His Family History Is Significant For: History reviewed. No pertinent family history.  His Social History Is Significant For: History   Social History  . Marital Status: Divorced    Spouse Name: N/A    Number  of Children: N/A  . Years of Education: N/A   Social History Main Topics  . Smoking status: Light Tobacco Smoker    Types: Cigars  . Smokeless tobacco: None  . Alcohol Use: 6.0 oz/week    12 drink(s) per week  . Drug Use: No  . Sexual Activity: None   Other Topics Concern  . None   Social History Narrative  . None    His Allergies Are:  Allergies  Allergen Reactions  . Codeine     REACTION: Nausea  :   His Current Medications Are:  Outpatient Encounter Prescriptions as of 05/31/2013  Medication Sig Dispense Refill  . amLODipine (NORVASC) 10 MG tablet Take 10 mg by mouth daily.      Marland Kitchen aspirin 81 MG  tablet Take 81 mg by mouth daily.      . colesevelam (WELCHOL) 625 MG tablet Take 1,875 mg by mouth daily.      Marland Kitchen ezetimibe (ZETIA) 10 MG tablet Take 10 mg by mouth daily.      . nebivolol (BYSTOLIC) 5 MG tablet Take 5 mg by mouth at bedtime.      Marland Kitchen olmesartan-hydrochlorothiazide (BENICAR HCT) 40-25 MG per tablet Take 1 tablet by mouth daily.       No facility-administered encounter medications on file as of 05/31/2013.  :  Review of Systems:  Out of a complete 14 point review of systems, all are reviewed and negative with the exception of these symptoms as listed below:   Review of Systems  Constitutional: Negative.   HENT: Negative.   Eyes: Negative.   Respiratory: Negative.   Cardiovascular: Negative.   Gastrointestinal: Negative.   Endocrine: Negative.   Genitourinary: Negative.   Musculoskeletal: Negative.   Skin: Negative.   Allergic/Immunologic: Negative.   Neurological: Negative.   Hematological: Negative.   Psychiatric/Behavioral: Negative.   All other systems reviewed and are negative.    Objective:  Neurologic Exam  Physical Exam Physical Examination:   Filed Vitals:   05/31/13 1022  BP: 152/92  Pulse: 59  Temp: 98.7 F (37.1 C)     General Examination: The patient is a very pleasant 57 y.o. male in no acute distress. He appears well-developed and well-nourished and very well groomed.   HEENT: Normocephalic, atraumatic, pupils are equal, round and reactive to light and accommodation. Extraocular tracking is good without limitation to gaze excursion or nystagmus noted. Normal smooth pursuit is noted. Hearing is grossly intact. Face is symmetric with normal facial animation and normal facial sensation. Speech is clear with no dysarthria noted. There is no hypophonia. There is no lip, neck/head, jaw or voice tremor. Neck is supple with full range of passive and active motion. There are no carotid bruits on auscultation. Oropharynx exam reveals: mild mouth  dryness, good dental hygiene and moderate airway crowding, due to significantly enlarged uvula which also appears irritated and swollen. His soft palate is also thick and redundant appearing. Mallampati is class III. Tongue protrudes centrally and palate elevates symmetrically. On nasal inspection, he has mild erythema and mucosal swelling on the right more than left nostril.   Chest: Clear to auscultation without wheezing, rhonchi or crackles noted.  Heart: S1+S2+0, regular and normal without murmurs, rubs or gallops noted.   Abdomen: Soft, non-tender and non-distended with normal bowel sounds appreciated on auscultation.  Extremities: There is trace pitting edema in the distal lower extremities bilaterally around the ankles, R>L. Pedal pulses are intact.  Skin: Warm and dry without trophic changes  noted. There are no varicose veins.  Musculoskeletal: exam reveals no obvious joint deformities, tenderness or joint swelling or erythema.   Neurologically:  Mental status: The patient is awake, alert and oriented in all 4 spheres. His memory, attention, language and knowledge are appropriate. There is no aphasia, agnosia, apraxia or anomia. Speech is clear with normal prosody and enunciation. Thought process is linear. Mood is congruent and affect is normal.  Cranial nerves are as described above under HEENT exam. In addition, shoulder shrug is normal with equal shoulder height noted. Motor exam: Normal bulk, strength and tone is noted. There is no drift, tremor or rebound. Romberg is negative. Reflexes are 2+ throughout. Toes are downgoing bilaterally. Fine motor skills are intact with normal finger taps, normal hand movements, normal rapid alternating patting, normal foot taps and normal foot agility.  Cerebellar testing shows no dysmetria or intention tremor on finger to nose testing. There is no truncal or gait ataxia.  Sensory exam is intact to light touch.  Gait, station and balance are  unremarkable. No veering to one side is noted. No leaning to one side is noted. Posture is age-appropriate and stance is narrow based. No problems turning are noted. He turns en bloc.   Assessment and Plan:    In summary, Alejandro [name] is a very pleasant 57 y.o.-year old male with a history of OSA, on CPAP treatment at a pressure of 13 cwp. His physical exam is stable and He indicates good results with the use of CPAP, and good tolerance of the pressure and mask, but unfortunately, there is at times a significant leak and he has residual mild obstructive sleep apnea. I think most of the leak is due to the high pressure and perhaps incompatibility with the nasal pillows. At this juncture as our first I would like to suggest a switch to a nasal mask. He has already been contacted by his DME company regarding need for refitting his mask and troubleshooting the leak. I asked him to go ahead and meet with his DME company. I provided him with a order for a nasal mask, size 2 fit. We will also fax disorder to his DME company, [facility]. I reviewed the compliance data with the patient and encouraged him to continue to use CPAP regularly to help reduce cardiovascular risk. I commended him for trying hard to be compliant. I think we will eventually get him to a point where his leak is reduced and his AHI is also in the desirable range less than 5 per hour. Down the Road we may increase his pressure but for now I would like to fix the problem with the leak. He is encouraged to pursue weight loss aggressively. Unfortunately he has not worked hard enough on this he admits.  For his allergic rhinitis he is due to see a new allergy specialist next week. Treating his allergies will also help with CPAP tolerance I believe. We also talked about trying to maintaining a healthy lifestyle in general. I encouraged the patient to eat healthy, exercise daily and keep well hydrated, to keep a scheduled bedtime and wake time  routine, to not skip any meals and eat healthy snacks in between meals and to have protein with every meal. I stressed the importance of regular exercise.   I answered all his questions today and the patient was in agreement with the above outlined plan. I would like to see the patient back in 3 months, sooner if the need arises and encouraged  him to call with any interim questions, concerns, problems or updates.  Most of my 35 minute visit today was spent in counseling and coordination of care, reviewing test results and reviewing medication.

## 2013-05-31 NOTE — Patient Instructions (Addendum)
Please continue using your CPAP regularly. While your insurance requires that you use CPAP at least 4 hours each night on 70% of the nights, I recommend, that you not skip any nights and use it throughout the night if you can. Getting used to CPAP does take time and patience and discipline. Untreated obstructive sleep apnea when it is moderate to severe can have an adverse impact on cardiovascular health and raise her risk for heart disease, arrhythmias, hypertension, congestive heart failure, stroke and diabetes. Untreated obstructive sleep apnea causes sleep disruption, nonrestorative sleep, and sleep deprivation. This can have an impact on your day to day functioning and cause daytime sleepiness and impairment of cognitive function, memory loss, mood disturbance, and problems focussing. Using CPAP regularly can improve these symptoms.  Follow up in 3 months, I have changed the order for your mask. We may increase the CPAP pressure, unless you are able to lose some weight.

## 2013-06-01 ENCOUNTER — Encounter: Payer: Self-pay | Admitting: Neurology

## 2013-06-09 ENCOUNTER — Other Ambulatory Visit: Payer: Self-pay

## 2013-06-24 ENCOUNTER — Encounter: Payer: Self-pay | Admitting: Neurology

## 2013-07-03 NOTE — Progress Notes (Signed)
Quick Note:  I reviewed the patient's CPAP compliance data from 03/04/2013 to 06/16/2013, which is a total of 105 days, during which time the patient used CPAP every day except for 5 days. The average usage for all days was 4 hours and 40 minutes. The percent used days greater than 4 hours was 64%, indicating fair compliance. The residual AHI was 9.8 per hour, indicating a suboptimal treatment pressure of 13 cwp with EPR of 2. His leak at times was quite high. I have reviewed most of this data on our office visit from 05/31/2013. The patient has had issues with allergic rhinitis for which he was scheduled to see an allergy specialist. In addition, he was contacted by his DME company for additional mask fitting and I gave him an order for a nasal mask as well. Please see my assessment and plan for that encounter as well. I will review this data with the patient at the next office visit, provide feedback and additional troubleshooting if need be.  Huston Foley, MD, PhD [facility] ([facility]   ______

## 2013-07-15 ENCOUNTER — Encounter: Payer: Self-pay | Admitting: Neurology

## 2013-08-09 NOTE — Progress Notes (Signed)
Quick Note:  I reviewed the patient's CPAP compliance data from 06/13/2013 to 07/13/2013, which is a total of 31 days, during which time the patient used CPAP every day except for 1 day. The average usage for all days was 5 hours and 43 minutes. The percent used days greater than 4 hours was 87 %, indicating very good compliance. The residual AHI was 6.3 per hour, indicating a fairly adequate treatment pressure of 13 cwp with EPR of 2. His leak has improved and his residual AHI has improved on this setting as compared to the last downloaded. I will review this data with the patient at the next office visit, which is scheduled for 10/11/2013 at 3:30 PM, provide feedback and additional troubleshooting if need be.  Star Age, MD, PhD [facility] ([facility]   ______

## 2013-09-22 ENCOUNTER — Encounter: Payer: Self-pay | Admitting: Neurology

## 2013-09-30 NOTE — Progress Notes (Signed)
Quick Note:  I reviewed the patient's CPAP compliance data from 03/04/2013 to 09/04/2013, which is a total of 185 days, during which time the patient used CPAP every day except for 9 days. The average usage for all days was 5 hours and 19 minutes. The percent used days greater than 4 hours was 74 %, indicating adequate compliance. The residual AHI was 7.3 per hour, indicating a somewhat suboptimal treatment pressure of 13 cwp with EPR of 2. His leak was fairly low. I will review this data with the patient at the next office visit, which is coming up on 10/11/2013 at 3:30 PM, provide feedback and additional troubleshooting if need be.  Star Age, MD, PhD [facility]   ______

## 2013-10-05 ENCOUNTER — Encounter: Payer: Self-pay | Admitting: Neurology

## 2013-10-11 ENCOUNTER — Encounter: Payer: Self-pay | Admitting: Neurology

## 2013-10-11 ENCOUNTER — Ambulatory Visit (INDEPENDENT_AMBULATORY_CARE_PROVIDER_SITE_OTHER): Payer: BC Managed Care – PPO | Admitting: Neurology

## 2013-10-11 VITALS — BP 146/94 | HR 60 | Ht 70.0 in | Wt 243.0 lb

## 2013-10-11 DIAGNOSIS — Z9989 Dependence on other enabling machines and devices: Secondary | ICD-10-CM

## 2013-10-11 DIAGNOSIS — Z8679 Personal history of other diseases of the circulatory system: Secondary | ICD-10-CM

## 2013-10-11 DIAGNOSIS — G4761 Periodic limb movement disorder: Secondary | ICD-10-CM

## 2013-10-11 DIAGNOSIS — J309 Allergic rhinitis, unspecified: Secondary | ICD-10-CM

## 2013-10-11 DIAGNOSIS — I10 Essential (primary) hypertension: Secondary | ICD-10-CM

## 2013-10-11 DIAGNOSIS — G4733 Obstructive sleep apnea (adult) (pediatric): Secondary | ICD-10-CM

## 2013-10-11 NOTE — Progress Notes (Signed)
Subjective:    Patient ID: Alejandro [name] is a 58 y.o. male.  HPI    Interim history:   [name] is a very pleasant 58 year old right-handed gentleman with an underlying medical history of hypertension, hyperlipidemia, heart disease and prior substance abuse, who presents for followup consultation of his obstructive sleep apnea. He is unaccompanied today. I last saw him on 05/31/2013, at which time I suggested he switch to nasal mask. I noted an increase in air leak and residual mild obstructive sleep apnea. He reported having more issues with allergies and he was scheduled to see an allergy doctor. Overall, he felt he was sleeping much better. He was compliant with treatment. He was still trying to be fully compliant. He was advised by his DME company regarding mask fitting. I did not increase his pressure at the time. I reviewed his download from 03/04/2013 through 09/04/2013 which is a total of 185 days during which time he used CPAP every night except for 9 days. Average usage was 5 hours and 19 minutes. Percent used days greater than 4 hours was 74%. Residual AHI was 7.4. 95th percentile of leak was 42.6 at a pressure of 13 with EPR of 2. Compared to before, his leak has improved and his residual AHI has also improved.  Today, he reports sleeping better with a nasal mask and he had discovered a crack in the nasal mask and got a new one. He feels better overall and sleep is improved. Unfortunately, he continues to have allergy symptoms and has had a change in her allergy medications to QNasl from flonase and he is on Xyzal. He has dust mite allergies. He would like to get allergy shots but he would have to come off of his beta blocker as I understand. He is on Bystolic.   I first met him on 01/25/2013 at which time I asked him to come back for sleep study. He has split-night sleep study on 01/31/2013: Baseline sleep efficiency was reduced at 58.2% with a latency to sleep of 12.5  minutes and wake after sleep onset of 46 minutes with severe sleep fragmentation noted. He had a markedly increased percentage of stage I sleep, and a reduced percentage of stage II sleep and absence of slow-wave and REM sleep prior to CPAP. He had a moderate to loud degree of snoring. His total AHI was 87.6 per hour. His baseline oxygen saturation was 89%, his nadir was 83%. He was then titrated on CPAP between 5-13 cm of water pressure. He a reduction of his AHI to 8.8 events per hour at a pressure of 13. He was noted to have mild periodic leg movements of sleep during the CPAP titration portion of the test but no significant arousals. His baseline oxygen saturation with CPAP improved to 93%.  I reviewed his compliance data from 03/04/2013 through 05/20/2013 which is a total of 78 days during which time he used it every day except for 4 days. His average daily usage is 4 hours and 57 minutes. Percent used days greater than 4 hours was 69% which indicates fair compliance. His residual AHI was 9.9 per hour with a significant leak at times. His median leak was 12.6 his 95th percentile was 64.8 for leak. His that pressure is 13 with EPR of 2.  Compliance data from 03/04/2013 through 05/30/2013 which is a total of 88 days showed: Percent used days greater than 4 hours was again 69%. His average usage for all days was 5 hours. His  residual AHI is again noted to be high at 10.4 with a significant leak at times.   His Past Medical History Is Significant For: Past Medical History  Diagnosis Date  . HYPERCHOLESTEROLEMIA   . ANXIETY   . CAD   . AORTIC STENOSIS, MILD   . OSTEOARTHRITIS, KNEE   . INSOMNIA   . DYSPNEA   . CHEST PAIN-UNSPECIFIED   . Hypertension   . OSA on CPAP 05/31/2013    His Past Surgical History Is Significant For: Past Surgical History  Procedure Laterality Date  . Lumbar laminectomy  Late 1990's    His Family History Is Significant For: History reviewed. No pertinent family  history.  His Social History Is Significant For: History   Social History  . Marital Status: Divorced    Spouse Name: N/A    Number of Children: N/A  . Years of Education: N/A   Social History Main Topics  . Smoking status: Light Tobacco Smoker    Types: Cigars  . Smokeless tobacco: None  . Alcohol Use: 6.0 oz/week    12 drink(s) per week  . Drug Use: No  . Sexual Activity: None   Other Topics Concern  . None   Social History Narrative  . None    His Allergies Are:  Allergies  Allergen Reactions  . Codeine     REACTION: Nausea  :   His Current Medications Are:  Outpatient Encounter Prescriptions as of 10/11/2013  Medication Sig  . amLODipine (NORVASC) 10 MG tablet Take 10 mg by mouth daily.  Marland Kitchen aspirin 81 MG tablet Take 81 mg by mouth daily.  Marland Kitchen BENICAR 40 MG tablet Take 1 tablet by mouth daily.  . colesevelam (WELCHOL) 625 MG tablet Take 1,875 mg by mouth daily.  Marland Kitchen ezetimibe (ZETIA) 10 MG tablet Take 10 mg by mouth daily.  Marland Kitchen levocetirizine (XYZAL) 5 MG tablet Take 1 tablet by mouth daily.  . montelukast (SINGULAIR) 10 MG tablet Take 1 tablet by mouth daily.  . nebivolol (BYSTOLIC) 5 MG tablet Take 5 mg by mouth at bedtime.  . QNASL 80 MCG/ACT AERS   . VIAGRA 100 MG tablet Take 1 tablet by mouth as needed.  . [DISCONTINUED] olmesartan-hydrochlorothiazide (BENICAR HCT) 40-25 MG per tablet Take 1 tablet by mouth daily.  :  Review of Systems:  Out of a complete 14 point review of systems, all are reviewed and negative with the exception of these symptoms as listed below:   Review of Systems  Constitutional: Negative.   HENT: Negative.   Eyes: Negative.   Respiratory: Negative.   Cardiovascular: Negative.   Gastrointestinal: Negative.   Endocrine: Negative.   Genitourinary: Negative.   Musculoskeletal: Negative.   Skin: Negative.   Allergic/Immunologic: Positive for environmental allergies.  Neurological: Negative.   Hematological: Negative.    Psychiatric/Behavioral: Negative.     Objective:  Neurologic Exam  Physical Exam Physical Examination:   Filed Vitals:   10/11/13 1518  BP: 146/94  Pulse: 60     General Examination: The patient is a very pleasant 58 y.o. male in no acute distress. He appears well-developed and well-nourished and very well groomed.   HEENT: Normocephalic, atraumatic, pupils are equal, round and reactive to light and accommodation. Extraocular tracking is good without limitation to gaze excursion or nystagmus noted. Normal smooth pursuit is noted. Hearing is grossly intact. Face is symmetric with normal facial animation and normal facial sensation. Speech is clear with no dysarthria noted. There is no hypophonia. There is  no lip, neck/head, jaw or voice tremor. Neck is supple with full range of passive and active motion. There are no carotid bruits on auscultation. Oropharynx exam reveals: mild mouth dryness, good dental hygiene and moderate airway crowding, due to significantly enlarged uvula which also appears irritated and swollen. His soft palate is also thick and redundant appearing. Mallampati is class III. Tongue protrudes centrally and palate elevates symmetrically. On nasal inspection, he has mild erythema and mucosal swelling on the right more than left nostril.   Chest: Clear to auscultation without wheezing, rhonchi or crackles noted.  Heart: S1+S2+0, regular and normal without murmurs, rubs or gallops noted.   Abdomen: Soft, non-tender and non-distended with normal bowel sounds appreciated on auscultation.  Extremities: There is trace pitting edema in the distal lower extremities bilaterally.  Skin: Warm and dry without trophic changes noted. There are no varicose veins.  Musculoskeletal: exam reveals no obvious joint deformities, tenderness or joint swelling or erythema.   Neurologically:  Mental status: The patient is awake, alert and oriented in all 4 spheres. His memory, attention,  language and knowledge are appropriate. There is no aphasia, agnosia, apraxia or anomia. Speech is clear with normal prosody and enunciation. Thought process is linear. Mood is congruent and affect is normal.  Cranial nerves are as described above under HEENT exam. In addition, shoulder shrug is normal with equal shoulder height noted. Motor exam: Normal bulk, strength and tone is noted. There is no drift, tremor or rebound. Romberg is negative. Reflexes are 2+ throughout. Toes are downgoing bilaterally. Fine motor skills are intact with normal finger taps, normal hand movements, normal rapid alternating patting, normal foot taps and normal foot agility.  Cerebellar testing shows no dysmetria or intention tremor on finger to nose testing. There is no truncal or gait ataxia.  Sensory exam is intact to light touch.  Gait, station and balance are unremarkable. No veering to one side is noted. No leaning to one side is noted. Posture is age-appropriate and stance is narrow based. No problems turning are noted. He turns en bloc.   Assessment and Plan:   In summary, Shadi Bertsch is a very pleasant 58 year old male with an underlying medical history of hypertension, heart disease, aortic stenosis, osteoarthritis, hyperlipidemia, allergies, and anxiety, who presents for followup consultation of his obstructive sleep apnea. He is on CPAP treatment at a pressure of 13 cwp. His physical exam remains stable and he indicates good results with the use of CPAP, and good tolerance of the pressure and mask. His most recent download indicates mild residual sleep apnea, and his leak has improved. Changing him to nasal mask has helped. His compliance is good. I would like to see him back in 6 months, sooner if the need arises. I do not think we need to increase the pressure at this time.  He now sees an allergy specialist. He has had some changes in his allergy medications but really would like to consider allergy  shots. Unfortunately he would have to come off of his beta blocker from what I understand to be able to safely get allergy shots. He will discuss this with his cardiologist. We also talked about trying to maintaining a healthy lifestyle in general. I encouraged the patient to eat healthy, exercise daily and keep well hydrated, to keep a scheduled bedtime and wake time routine, to not skip any meals and eat healthy snacks in between meals and to have protein with every meal. I stressed the importance of  regular exercise.   I answered all his questions today and the patient was in agreement with the above outlined plan. I would like to see the patient back in 6 months, sooner if the need arises and encouraged him to call with any interim questions, concerns, problems or updates.

## 2013-10-11 NOTE — Patient Instructions (Signed)
Please continue using your CPAP regularly. While your insurance requires that you use CPAP at least 4 hours each night on 70% of the nights, I recommend, that you not skip any nights and use it throughout the night if you can. Getting used to CPAP does take time and patience and discipline. Untreated obstructive sleep apnea when it is moderate to severe can have an adverse impact on cardiovascular health and raise her risk for heart disease, arrhythmias, hypertension, congestive heart failure, stroke and diabetes. Untreated obstructive sleep apnea causes sleep disruption, nonrestorative sleep, and sleep deprivation. This can have an impact on your day to day functioning and cause daytime sleepiness and impairment of cognitive function, memory loss, mood disturbance, and problems focussing. Using CPAP regularly can improve these symptoms.   

## 2014-02-16 ENCOUNTER — Other Ambulatory Visit (HOSPITAL_COMMUNITY): Payer: Self-pay | Admitting: Urology

## 2014-02-16 DIAGNOSIS — C61 Malignant neoplasm of prostate: Secondary | ICD-10-CM

## 2014-02-16 DIAGNOSIS — R972 Elevated prostate specific antigen [PSA]: Secondary | ICD-10-CM

## 2014-03-07 ENCOUNTER — Encounter (HOSPITAL_COMMUNITY)
Admission: RE | Admit: 2014-03-07 | Discharge: 2014-03-07 | Disposition: A | Discharge status: Home / Self Care | Payer: BC Managed Care – PPO | Source: Ambulatory Visit | Attending: Urology | Admitting: Urology

## 2014-03-07 ENCOUNTER — Encounter (HOSPITAL_COMMUNITY): Payer: Self-pay

## 2014-03-07 DIAGNOSIS — C61 Malignant neoplasm of prostate: Secondary | ICD-10-CM

## 2014-03-07 DIAGNOSIS — R972 Elevated prostate specific antigen [PSA]: Secondary | ICD-10-CM

## 2014-03-07 MED ORDER — TECHNETIUM TC 99M MEDRONATE IV KIT
25.0000 | PACK | Freq: Once | INTRAVENOUS | Status: AC | PRN
  Administered 2014-03-07: 24.8 via INTRAVENOUS

## 2014-03-08 ENCOUNTER — Ambulatory Visit: Payer: BC Managed Care – PPO | Admitting: Cardiovascular Disease

## 2014-03-13 ENCOUNTER — Telehealth: Payer: Self-pay | Admitting: Oncology

## 2014-03-13 NOTE — Telephone Encounter (Signed)
C/D 03/13/14 for appt. 03/21/14

## 2014-03-13 NOTE — Telephone Encounter (Signed)
S/W PATIENT AND GAVE NP APPT FOR 08/18 @ 10:30 W/DR. SHADAD.  REFERRING DR. Chelsea

## 2014-03-17 ENCOUNTER — Other Ambulatory Visit: Payer: Self-pay | Admitting: Oncology

## 2014-03-17 DIAGNOSIS — C61 Malignant neoplasm of prostate: Secondary | ICD-10-CM

## 2014-03-20 ENCOUNTER — Telehealth: Payer: Self-pay | Admitting: Medical Oncology

## 2014-03-20 NOTE — Telephone Encounter (Signed)
LVMOM with patient reminding him of tomorrow's appt and to bring a list of his current medications. Pt to return call to clinic with questions/concerns.

## 2014-03-21 ENCOUNTER — Ambulatory Visit (HOSPITAL_BASED_OUTPATIENT_CLINIC_OR_DEPARTMENT_OTHER): Payer: BC Managed Care – PPO | Admitting: Oncology

## 2014-03-21 ENCOUNTER — Telehealth: Payer: Self-pay | Admitting: *Deleted

## 2014-03-21 ENCOUNTER — Ambulatory Visit: Payer: BC Managed Care – PPO

## 2014-03-21 ENCOUNTER — Other Ambulatory Visit (HOSPITAL_BASED_OUTPATIENT_CLINIC_OR_DEPARTMENT_OTHER): Payer: BC Managed Care – PPO

## 2014-03-21 ENCOUNTER — Encounter: Payer: Self-pay | Admitting: Oncology

## 2014-03-21 VITALS — BP 136/86 | HR 50 | Temp 99.0°F | Resp 18 | Ht 70.0 in | Wt 246.9 lb

## 2014-03-21 DIAGNOSIS — C61 Malignant neoplasm of prostate: Secondary | ICD-10-CM

## 2014-03-21 DIAGNOSIS — R599 Enlarged lymph nodes, unspecified: Secondary | ICD-10-CM

## 2014-03-21 LAB — CBC WITH DIFFERENTIAL/PLATELET
BASO%: 1.4 % (ref 0.0–2.0)
Basophils Absolute: 0.1 10*3/uL (ref 0.0–0.1)
EOS%: 1.2 % (ref 0.0–7.0)
Eosinophils Absolute: 0.1 10*3/uL (ref 0.0–0.5)
HCT: 48.5 % (ref 38.4–49.9)
HGB: 16.3 g/dL (ref 13.0–17.1)
LYMPH%: 24.2 % (ref 14.0–49.0)
MCH: 31.6 pg (ref 27.2–33.4)
MCHC: 33.7 g/dL (ref 32.0–36.0)
MCV: 93.8 fL (ref 79.3–98.0)
MONO#: 1.2 10*3/uL — ABNORMAL HIGH (ref 0.1–0.9)
MONO%: 20.1 % — ABNORMAL HIGH (ref 0.0–14.0)
NEUT#: 3.2 10*3/uL (ref 1.5–6.5)
NEUT%: 53.1 % (ref 39.0–75.0)
Platelets: 267 10*3/uL (ref 140–400)
RBC: 5.17 10*6/uL (ref 4.20–5.82)
RDW: 12.7 % (ref 11.0–14.6)
WBC: 6.1 10*3/uL (ref 4.0–10.3)
lymph#: 1.5 10*3/uL (ref 0.9–3.3)

## 2014-03-21 LAB — COMPREHENSIVE METABOLIC PANEL (CC13)
ALT: 80 U/L — ABNORMAL HIGH (ref 0–55)
AST: 57 U/L — ABNORMAL HIGH (ref 5–34)
Albumin: 4 g/dL (ref 3.5–5.0)
Alkaline Phosphatase: 119 U/L (ref 40–150)
Anion Gap: 9 mEq/L (ref 3–11)
BUN: 18.3 mg/dL (ref 7.0–26.0)
CO2: 28 mEq/L (ref 22–29)
Calcium: 10.9 mg/dL — ABNORMAL HIGH (ref 8.4–10.4)
Chloride: 99 mEq/L (ref 98–109)
Creatinine: 1.1 mg/dL (ref 0.7–1.3)
Glucose: 115 mg/dl (ref 70–140)
Potassium: 5.1 mEq/L (ref 3.5–5.1)
Sodium: 136 mEq/L (ref 136–145)
Total Bilirubin: 0.51 mg/dL (ref 0.20–1.20)
Total Protein: 7.8 g/dL (ref 6.4–8.3)

## 2014-03-21 NOTE — Consult Note (Signed)
Reason for Referral: Prostate cancer.   HPI: This is a pleasant 58 year old gentleman currently he is well referred to me for evaluation of prostate cancer and lymphadenopathy. He is a gentleman with mild hypertension, coronary artery disease and hyperlipidemia but for the most part has been reasonably good health. He has been evaluated by Dr. Reece Agar in the past for voiding symptoms are consistent of urgency and weak stream and was given Flomax. Most recently, he had increase in his PSA over a period of time most recently up to 6.4 in June of 2015. Previously his PSA was 3.6 and 1.6 respectively. He also reported to voiding symptoms consistent of urgency, weak stream and feeling of incomplete emptying. These symptoms improved with Flomax previously. Most recently he has very limited symptoms including nocturia but no dysuria or hematuria. He was evaluated by Dr. Karsten Ro in June of 2015. He recommended proceeding with a biopsy at that time. On 02/06/2014 he underwent a biopsy which showed a high volume disease. He had prostate cancer and all cores and the majority of his cancer was Gleason score 4+4 equals 8. Of note, his initial digital rectal examination showed right-sided induration. His prostate volume was around 62 cc. Imaging studies including CT scan of the abdomen and pelvis showed 1.3 cm right internal iliac lymph node and a 1.5 cm left internal iliac lymph node. Several prominent but not enlarged mesorectal lymph nodes were noted. There is also 1.8 cm short axis intermediate lesion in the rectosigmoid junction. His bone scan showed no evidence of metastatic disease on 03/07/2049. He received from about 120 mg on 03/13/2014 and was refered to me for evaluation.  Clinically, he is relatively asymptomatic at this time. He has not reported any headaches or vision or double vision. Has not reported any syncope. He does not report any fevers chills or sweats. He does report some mild fatigue. It is not  pleuritic chest pain palpitation or orthopnea. Does not report any nausea vomiting or abdominal pain. Does not report any hematochezia or melena. He did not report any skeletal complaints of arthralgias or myalgias. He reports no constitutional symptoms of weight loss or appetite changes. He continues to work full-time without any decline. He does not report any lymphadenopathy or petechiae. Does not report any clotting or thrombosis. Since his review of systems unremarkable.   Past Medical History  Diagnosis Date  . HYPERCHOLESTEROLEMIA   . ANXIETY   . CAD   . AORTIC STENOSIS, MILD   . OSTEOARTHRITIS, KNEE   . INSOMNIA   . DYSPNEA   . CHEST PAIN-UNSPECIFIED   . Hypertension   . OSA on CPAP 05/31/2013  :  Past Surgical History  Procedure Laterality Date  . Lumbar laminectomy  Late 1990's  :  Current Outpatient Prescriptions  Medication Sig Dispense Refill  . amLODipine (NORVASC) 10 MG tablet Take 10 mg by mouth daily.      Marland Kitchen aspirin 81 MG tablet Take 81 mg by mouth daily.      Marland Kitchen BENICAR 40 MG tablet Take 1 tablet by mouth daily.      Marland Kitchen ezetimibe (ZETIA) 10 MG tablet Take 10 mg by mouth daily.      Marland Kitchen levocetirizine (XYZAL) 5 MG tablet Take 1 tablet by mouth daily.      . nebivolol (BYSTOLIC) 5 MG tablet Take 5 mg by mouth at bedtime.      Marland Kitchen spironolactone (ALDACTONE) 25 MG tablet       . tamsulosin (FLOMAX) 0.4  MG CAPS capsule       . VIAGRA 100 MG tablet Take 1 tablet by mouth as needed.      . montelukast (SINGULAIR) 10 MG tablet Take 1 tablet by mouth daily.       No current facility-administered medications for this visit.      Allergies  Allergen Reactions  . Codeine     REACTION: Nausea  :  No family history on file.:  History   Social History  . Marital Status: Divorced    Spouse Name: N/A    Number of Children: N/A  . Years of Education: N/A   Occupational History  . Not on file.   Social History Main Topics  . Smoking status: Light Tobacco Smoker     Types: Cigars  . Smokeless tobacco: Not on file  . Alcohol Use: 6.0 oz/week    12 drink(s) per week  . Drug Use: No  . Sexual Activity: Not on file   Other Topics Concern  . Not on file   Social History Narrative  . No narrative on file  :  Pertinent items are noted in HPI.  Exam: Blood pressure 136/86, pulse 50, temperature 99 F (37.2 C), temperature source Oral, resp. rate 18, height 5\' 10"  (1.778 m), weight 246 lb 14.4 oz (111.993 kg). General appearance: alert and cooperative Head: Normocephalic, without obvious abnormality Throat: lips, mucosa, and tongue normal; teeth and gums normal Neck: no adenopathy Back: symmetric, no curvature. ROM normal. No CVA tenderness. Resp: clear to auscultation bilaterally Chest wall: no tenderness Cardio: regular rate and rhythm, S1, S2 normal, no murmur, click, rub or gallop GI: soft, non-tender; bowel sounds normal; no masses,  no organomegaly Extremities: extremities normal, atraumatic, no cyanosis or edema Pulses: 2+ and symmetric Skin: Skin color, texture, turgor normal. No rashes or lesions Lymph nodes: Cervical, supraclavicular, and axillary nodes normal. Neurologic: Grossly normal   Recent Labs  03/21/14 1042  WBC 6.1  HGB 16.3  HCT 48.5  PLT 267    Recent Labs  03/21/14 1042  NA 136  K 5.1  CO2 28  GLUCOSE 115  BUN 18.3  CREATININE 1.1  CALCIUM 10.9*     Nm Bone Scan Whole Body  03/07/2014   CLINICAL DATA:  History of prostate carcinoma.  Rising PSA level.  EXAM: NUCLEAR MEDICINE WHOLE BODY BONE SCAN  TECHNIQUE: Whole body anterior and posterior images were obtained approximately 3 hours after intravenous injection of radiopharmaceutical.  RADIOPHARMACEUTICALS:  24.8 mCi Technetium-99 MDP  COMPARISON:  Lumbar MRI, 03/23/2012  FINDINGS: There are no areas of abnormal radiotracer localization to bone to suggest neoplastic disease or an occult fracture.  Degenerative uptake is noted in the spine, most evident at  L2-L3, consistent with the disc degenerative changes evident at this level on the prior MRI. Degenerative uptake is also noted involving the sternoclavicular joints and prominently in the knees, right greater than left.  Renal uptake is symmetric.  IMPRESSION: No evidence of metastatic disease to bone.   Electronically Signed   By: Lajean Manes M.D.   On: 03/07/2014 12:41    Assessment and Plan:   58 year old gentleman with the following issues  1. Prostate cancer diagnosed in July of 2015. He presented with a PSA of 6.4 and some mild lower urinary symptoms. His biopsy showed a Gleason score 4+4 equals 8 high-volume disease and the majority of the cores. His imaging studies were reviewed and showed some mild adenopathy that is suspicious for metastatic  disease. The natural course of this disease was discussed today excessively including treatment options. Given the high-volume disease that he has, it is very possible that we're dealing with metastatic prostate cancer to regional lymphadenopathy. These lymph nodes could also be reactive. I explained to them they'll be 2 ways to determine the etiology of these lymph nodes. One is to biopsy which would be rather difficult. The other approach would be to treat with androgen depravation and followup scan in 3-4 months. I agree with Dr. Karsten Ro that this would be the preferred approach. If he has an excellent response, one can consider definitive treatment for his prostate. Options of treatment would be salvage surgery versus salvage radiation therapy would be reasonable as well. I think you will benefit from a prostate cancer multidisciplinary approach now makes a referral to be evaluated in the multidisciplinary clinic. The logistics of this clinic were explained to the patient and he is agreeable to participate.  2. Lymphadenopathy: Please see discussion above. The differential diagnosis would include reactive versus metastatic prostate cancer.  Lymphoproliferative disorder it is unlikely at this point. His laboratory data do not suggest any lymphocytosis or any suggestion of any hematological disorder.  Followup will be determined by the decision for multidisciplinary clinic.

## 2014-03-21 NOTE — Progress Notes (Signed)
Please see consult note.  

## 2014-03-21 NOTE — Progress Notes (Signed)
Checked in new patient with no financial issues prior to seeing the dr. He has appt card and has only traveled to [place] in last 21days.

## 2014-03-21 NOTE — Telephone Encounter (Signed)
Called patient to introduce myself as Prostate Oncology Navigator and coordinator of the Prostate [place], to confirm his referral for the clinic on 03/28/14 and his arrival time of 12:45.  I explained the format of the [facility].  He had an appt with Dr. Alen Blew today and expressed understanding of the arrival/registration procedure. I indicated I am mailing an Information Packet to him that contains medical information forms which he should complete and bring with him on the day of clinic.  He confirmed his address.  I provided my phone number and encouraged him to call me if he has any questions after receiving the Information Packet or prior to my call the day before clinic.  He verbalized understanding and expressed appreciation for my call.  Gayleen Orem, RN, BSN, [place] [facility] [place] [phone]

## 2014-03-22 LAB — PSA: PSA: 1.87 ng/mL (ref <=4.00)

## 2014-03-22 LAB — TESTOSTERONE: Testosterone: 91 ng/dL — ABNORMAL LOW (ref 300–890)

## 2014-03-23 ENCOUNTER — Telehealth: Payer: Self-pay | Admitting: Oncology

## 2014-03-23 ENCOUNTER — Telehealth: Payer: Self-pay | Admitting: *Deleted

## 2014-03-23 NOTE — Telephone Encounter (Signed)
Chart is ready for the Prostate Clinic on 8/25

## 2014-03-23 NOTE — Telephone Encounter (Signed)
Based on conversation with Dr. Alen Blew who said he had no medical concerns that precluded patient's attendance at the 9/11 Prostate [facility], I called patient to see if he was agreeable to the change.  He verbalized agreement.  Gayleen Orem, RN, BSN, [place] at [place] [phone]

## 2014-03-28 ENCOUNTER — Ambulatory Visit: Payer: BC Managed Care – PPO | Admitting: Oncology

## 2014-03-28 ENCOUNTER — Ambulatory Visit: Payer: BC Managed Care – PPO | Admitting: Radiation Oncology

## 2014-04-06 ENCOUNTER — Telehealth: Payer: Self-pay | Admitting: *Deleted

## 2014-04-06 NOTE — Telephone Encounter (Signed)
Called patient to confirm our previous conversation re: his attending the 04/14/14 Prostate [place] with an arrival time of 74.  He verbalized understanding.  Gayleen Orem, RN, BSN, [place] at [place] [phone]

## 2014-04-11 ENCOUNTER — Telehealth: Payer: Self-pay | Admitting: Oncology

## 2014-04-11 NOTE — Telephone Encounter (Signed)
Chart is ready for Dr. Alen Blew [facility].

## 2014-04-12 ENCOUNTER — Encounter: Payer: Self-pay | Admitting: Radiation Oncology

## 2014-04-12 NOTE — Progress Notes (Signed)
GU Location of Tumor / Histology: prostatic adenocarcinoma  If Prostate Cancer, Gleason Score is (4 + 4) and PSA is (6.4) on 01/2014  Alejandro [name] presented June 2015 with increased PSA.  Biopsies of prostate (if applicable) revealed:    Past/Anticipated interventions by urology, if any: androgen depravation with follow up scan  Past/Anticipated interventions by medical oncology, if any: yes, referral to prostate clinic  Weight changes, if any: no  Bowel/Bladder complaints, if any: yes, incomplete emptying, urgency and weak stream improved with flomax, denies hematuria or dysuria, reports nocturia   Nausea/Vomiting, if any: no  Pain issues, if any:  no  SAFETY ISSUES:  Prior radiation? no  Pacemaker/ICD? no  Possible current pregnancy? no  Is the patient on methotrexate? no  Current Complaints / other details:  58 year old male. Works full time. Prostate volume 62 cc. Bone scan negative. 1.3 cm right internal iliac lymph node and a 1.5 cm left internal iliac lymph node seen on CT. Divorced. Allergic to codeine.   Salvage surgery vs. Salvage radiation

## 2014-04-13 ENCOUNTER — Encounter: Payer: Self-pay | Admitting: Radiation Oncology

## 2014-04-13 ENCOUNTER — Telehealth: Payer: Self-pay | Admitting: *Deleted

## 2014-04-13 NOTE — Progress Notes (Signed)
Radiation Oncology         [phone] ________________________________  Multidisciplinary Prostate Cancer Clinic  Initial Radiation Oncology Consultation  Name: Alejandro [name] MRN: [id number]  Date: 04/14/2014   DOB: 06-22-1956  QI:[id number],XQJJ M, MD  Raynelle Bring, MD   REFERRING PHYSICIAN: Raynelle Bring, MD  DIAGNOSIS: 58 y.o. 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 gentleman.  He was noted to have an elevated and rising PSA of 6.4 in 01/16/14 by his primary care physician, Dr. Virgina Jock.  Accordingly, he was referred for evaluation in urology by Dr. Karsten Ro on 01/23/14,  digital rectal examination was performed at that time revealing a 1+ prostate gland with no nodules.  The patient proceeded to transrectal ultrasound with 12 biopsies of the prostate on 02/02/14.  The prostate volume measured 61.5 cc.  Out of 12 core biopsies, 12 were positive.  The maximum Gleason score was 4+4, and this was seen in the distribution displayed in the figure below.   Bone scan shows no metastases.  CT pelvis is positive for pelvic lymphadenopathy.  The patient reviewed the biopsy results with his urologist and he has kindly been referred today to the multidisciplinary prostate cancer clinic for presentation of pathology and radiology studies in our conference for discussion of potential radiation treatment options and clinical evaluation.  PREVIOUS RADIATION THERAPY: No  PAST MEDICAL HISTORY:  has a past medical history of HYPERCHOLESTEROLEMIA; ANXIETY; CAD; AORTIC STENOSIS, MILD; OSTEOARTHRITIS, KNEE; INSOMNIA; DYSPNEA; CHEST PAIN-UNSPECIFIED; Hypertension; OSA on CPAP (05/31/2013); and Prostate cancer.    PAST SURGICAL HISTORY: Past Surgical History  Procedure Laterality Date  . Lumbar laminectomy  Late 1990's  . Prostate biopsy    . Elbow surgery    . Neuroplasty  / transposition median nerve at carpal tunnel bilateral    . Colonoscopy      FAMILY HISTORY: family history includes Cancer in his father.  SOCIAL HISTORY:  reports that he has quit smoking. His smoking use included Cigars and Cigarettes. He has a 75 pack-year smoking history. He has never used smokeless tobacco. He reports that he drinks about 6 ounces of alcohol per week. He reports that he does not use illicit drugs.  ALLERGIES: Codeine  MEDICATIONS:  Current Outpatient Prescriptions  Medication Sig Dispense Refill  . amLODipine (NORVASC) 10 MG tablet Take 10 mg by mouth daily.      Marland Kitchen aspirin 81 MG tablet Take 81 mg by mouth daily.      Marland Kitchen BENICAR 40 MG tablet Take 1 tablet by mouth daily.      Marland Kitchen ezetimibe (ZETIA) 10 MG tablet Take 10 mg by mouth daily.      Marland Kitchen levocetirizine (XYZAL) 5 MG tablet Take 1 tablet by mouth daily.      . montelukast (SINGULAIR) 10 MG tablet Take 1 tablet by mouth daily.      . Multiple Vitamin (MULTIVITAMIN) tablet Take 1 tablet by mouth daily.      . nebivolol (BYSTOLIC) 5 MG tablet Take 5 mg by mouth at bedtime.      . QNASL 80 MCG/ACT AERS       . spironolactone (ALDACTONE) 25 MG tablet       . tamsulosin (FLOMAX) 0.4 MG CAPS capsule       . VIAGRA 100 MG tablet Take 1 tablet by mouth as needed.  No current facility-administered medications for this encounter.    REVIEW OF SYSTEMS:  A 15 point review of systems is documented in the electronic medical record. This was obtained by the nursing staff. However, I reviewed this with the patient to discuss relevant findings and make appropriate changes.  A comprehensive review of systems was negative..  The patient completed an IPSS and IIEF questionnaires.   PHYSICAL EXAM: This patient is in no acute distress.  He is alert and oriented.   height is 5\' 10"  (1.778 m) and weight is 253 lb 1.6 oz (114.805 kg). His oral temperature is 98 F (36.7 C). His blood pressure is 137/76 and his pulse is 62. His  respiration is 16 and oxygen saturation is 97%.  He exhibits no respiratory distress or labored breathing.  He appears neurologically intact.  His mood is pleasant.  His affect is appropriate.  Please note the digital rectal exam findings described above.  KPS = 100  100 - Normal; no complaints; no evidence of disease. 90   - Able to carry on normal activity; minor signs or symptoms of disease. 80   - Normal activity with effort; some signs or symptoms of disease. 70   - Cares for self; unable to carry on normal activity or to do active work. 60   - Requires occasional assistance, but is able to care for most of his personal needs. 50   - Requires considerable assistance and frequent medical care. 76   - Disabled; requires special care and assistance. 73   - Severely disabled; hospital admission is indicated although death not imminent. 64   - Very sick; hospital admission necessary; active supportive treatment necessary. 10   - Moribund; fatal processes progressing rapidly. 0     - Dead  Karnofsky DA, Abelmann [place], Craver LS and [facility] [phone]) The use of the nitrogen mustards in the palliative treatment of carcinoma: with particular reference to bronchogenic carcinoma Cancer 1 634-56   LABORATORY DATA:  Lab Results  Component Value Date   WBC 6.1 03/21/2014   HGB 16.3 03/21/2014   HCT 48.5 03/21/2014   MCV 93.8 03/21/2014   PLT 267 03/21/2014   Lab Results  Component Value Date   NA 136 03/21/2014   K 5.1 03/21/2014   CL 98 06/13/2008   CO2 28 03/21/2014   Lab Results  Component Value Date   ALT 80* 03/21/2014   AST 57* 03/21/2014   ALKPHOS 119 03/21/2014   BILITOT 0.51 03/21/2014     RADIOGRAPHY: No results found.    IMPRESSION: This gentleman is a 58 y.o. 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  When he appears to have involved pelvic lymph nodes. His T-Stage, Gleason's Score, and PSA put him into the high  risk group.  Accordingly he is eligible for a variety of potential treatment options.  The NCCN guidelines recommend androgen deprivation with or without local radiotherapy.  PLAN: Today I reviewed the findings and workup thus far.  We discussed the natural history of localized node positive high-risk prostate cancer.  We reviewed the the implications of T-stage, N-stage, Gleason's Score, and PSA on decision-making and outcomes in prostate cancer.  We discussed radiation treatment in the management of prostate cancer with regard to the logistics and delivery of external beam radiation treatment.  We discussed the combination of androgen deprivation with radiation and ideal timing of these modalities.  We discussed the use of androgen deprivation alone without radiotherapy as one potential option.  The patient expressed interest in external beam radiotherapy.  I filled out a patient counseling form for him with relevant treatment diagrams and we retained a copy for our records.   The patient would like to proceed with continued androgen deprivation with repeat pelvic CT to assess lymph node response. He is also interested in pursuing prostate IMRT with coverage of the involved lymph nodes, depending on the results next CT.     I enjoyed meeting with him today, and will look forward to participating in the care of this very nice gentleman.   I spent more than 50% of today's visit in counseling and/or coordination of care.   ------------------------------------------------  Sheral Apley. Tammi Klippel, M.D.

## 2014-04-13 NOTE — Telephone Encounter (Signed)
Called patient to see if he had any questions prior to his attendance at tomorrow's Pro[facility] [place].  He stated he did not.  He confirmed he would bring the completed health information forms he received in the mailing I sent, confirmed his understanding of a 0745 arrival time, confirmed his understanding of the [facility] location.  Gayleen Orem, RN, BSN, [place] at [place] [phone]

## 2014-04-14 ENCOUNTER — Ambulatory Visit: Payer: BC Managed Care – PPO | Admitting: Oncology

## 2014-04-14 ENCOUNTER — Encounter: Payer: Self-pay | Admitting: Radiation Oncology

## 2014-04-14 ENCOUNTER — Encounter: Payer: Self-pay | Admitting: Neurology

## 2014-04-14 ENCOUNTER — Encounter: Payer: Self-pay | Admitting: *Deleted

## 2014-04-14 ENCOUNTER — Ambulatory Visit
Admission: RE | Admit: 2014-04-14 | Discharge: 2014-04-14 | Disposition: A | Discharge status: Home / Self Care | Payer: BC Managed Care – PPO | Source: Ambulatory Visit | Attending: Radiation Oncology | Admitting: Radiation Oncology

## 2014-04-14 ENCOUNTER — Ambulatory Visit (INDEPENDENT_AMBULATORY_CARE_PROVIDER_SITE_OTHER): Payer: BC Managed Care – PPO | Admitting: Neurology

## 2014-04-14 ENCOUNTER — Ambulatory Visit (HOSPITAL_BASED_OUTPATIENT_CLINIC_OR_DEPARTMENT_OTHER): Payer: BC Managed Care – PPO | Admitting: Oncology

## 2014-04-14 VITALS — BP 137/76 | HR 62 | Temp 98.0°F | Resp 16 | Ht 70.0 in | Wt 253.1 lb

## 2014-04-14 VITALS — BP 121/74 | HR 63 | Ht 70.0 in | Wt 251.0 lb

## 2014-04-14 DIAGNOSIS — I10 Essential (primary) hypertension: Secondary | ICD-10-CM

## 2014-04-14 DIAGNOSIS — G4733 Obstructive sleep apnea (adult) (pediatric): Secondary | ICD-10-CM

## 2014-04-14 DIAGNOSIS — Z9989 Dependence on other enabling machines and devices: Secondary | ICD-10-CM

## 2014-04-14 DIAGNOSIS — G4761 Periodic limb movement disorder: Secondary | ICD-10-CM

## 2014-04-14 DIAGNOSIS — C61 Malignant neoplasm of prostate: Secondary | ICD-10-CM

## 2014-04-14 DIAGNOSIS — J3089 Other allergic rhinitis: Secondary | ICD-10-CM

## 2014-04-14 DIAGNOSIS — Z8679 Personal history of other diseases of the circulatory system: Secondary | ICD-10-CM

## 2014-04-14 DIAGNOSIS — R599 Enlarged lymph nodes, unspecified: Secondary | ICD-10-CM

## 2014-04-14 NOTE — Patient Instructions (Addendum)

## 2014-04-14 NOTE — Progress Notes (Signed)
[facility] Prostate Clinic Clinical Social Work  Clinical Social Work met with patient at Prostate Clinic and reviewed distress screening protocol.  Pt was alone for his visit today. The patient scored a 5 on the Psychosocial Distress Thermometer which indicates moderate distress. Clinical Social Worker explained role of CSW, Patient and Family Support Center resources, and educated pt on other options to relieve stress and anxiety available to patients at [facility]. Pt reports his biggest stressor is related to his business being slow currently. 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 Pt has had some anxiety related to his appointment today and CSW normalized the many emotions pt's experience. CSW discussed coping techniques and options for assistance.  Pt was appreciative of visit and agrees to reach out to CSW as needed.   ONCBCN DISTRESS SCREENING 04/14/2014  Screening Type Initial Screening  Mark the number that describes how much distress you have been experiencing in the past week 5  Practical problem type Work/school  Emotional problem type Nervousness/Anxiety     Clinical Social Worker follow up needed: No.  If yes, follow up plan:  [place] , LCSW Clinical Social Worker [facility]  [facility] Phone: [phone] Fax: [phone]    

## 2014-04-14 NOTE — Progress Notes (Signed)
Subjective:    Patient ID: Alejandro [name] is a 58 y.o. male.  HPI    Interim history:   Alejandro [name] is a very pleasant 58 year old right-handed gentleman with an underlying medical history of hypertension, hyperlipidemia, heart disease, s/p b/l CTS, who presents for followup consultation of his obstructive sleep apnea. He is unaccompanied today. I last saw him on 10/11/2013, at which time he reported sleeping better with a nasal mask and he had discovered a crack in the nasal mask and got a new one. He felt better overall and sleep was improved. He had ongoing allergy symptoms and had a change in his allergy medication.  Unfortunately, in July of this year, he was diagnosed with prostate cancer.  Today, I reviewed his compliance data from 01/01/2014 and 2 04/13/2014 which is a total of 103 days, during which he uses CPAP every night except for 3 nights. Percent used days greater than 4 hours was 92%, indicating excellent compliance, residual AHI 1.8 per hour, leak high at 46.8 L per minute at the 95th percentile. Today, he reports that his allergies are flared up, and he takes only generic claritin and has been using nasal saline rinses and an over the counter nasal moisture spray. He uses a nasal mask and has no chinstrap. He has met with his cancer doctor frequently and has also met with the surgeon and radiation oncologist. As of right now he has been on hormone therapy and may be starting radiation therapy. He has established care with Dr. Einar Gip in cardiology as well.    I saw him on 05/31/2013, at which time I suggested he switch to nasal mask. I noted an increase in air leak and residual mild obstructive sleep apnea. He reported having more issues with allergies and he was scheduled to see an allergy doctor. Overall, he felt he was sleeping much better. He was compliant with treatment. He was still trying to be fully compliant. He was advised by his DME company regarding mask fitting.  I did not increase his pressure at the time. I reviewed his download from 03/04/2013 through 09/04/2013 which is a total of 185 days during which time he used CPAP every night except for 9 days. Average usage was 5 hours and 19 minutes. Percent used days greater than 4 hours was 74%. Residual AHI was 7.4. 95th percentile of leak was 42.6 at a pressure of 13 with EPR of 2. Compared to before, his leak has improved and his residual AHI has also improved.  I first met him on 01/25/2013 at which time I asked him to come back for sleep study. He has split-night sleep study on 01/31/2013: Baseline sleep efficiency was reduced at 58.2% with a latency to sleep of 12.5 minutes and wake after sleep onset of 46 minutes with severe sleep fragmentation noted. He had a markedly increased percentage of stage I sleep, and a reduced percentage of stage II sleep and absence of slow-wave and REM sleep prior to CPAP. He had a moderate to loud degree of snoring. His total AHI was 87.6 per hour. His baseline oxygen saturation was 89%, his nadir was 83%. He was then titrated on CPAP between 5-13 cm of water pressure. He a reduction of his AHI to 8.8 events per hour at a pressure of 13. He was noted to have mild periodic leg movements of sleep during the CPAP titration portion of the test but no significant arousals. His baseline oxygen saturation with CPAP improved to 93%.  I reviewed his compliance data from 03/04/2013 through 05/20/2013 which is a total of 78 days during which time he used it every day except for 4 days. His average daily usage is 4 hours and 57 minutes. Percent used days greater than 4 hours was 69% which indicates fair compliance. His residual AHI was 9.9 per hour with a significant leak at times. His median leak was 12.6 his 95th percentile was 64.8 for leak. His that pressure is 13 with EPR of 2.  Compliance data from 03/04/2013 through 05/30/2013 which is a total of 88 days showed: Percent used days greater than  4 hours was again 69%. His average usage for all days was 5 hours. His residual AHI is again noted to be high at 10.4 with a significant leak at times.   His Past Medical History Is Significant For: Past Medical History  Diagnosis Date  . HYPERCHOLESTEROLEMIA   . ANXIETY   . CAD   . AORTIC STENOSIS, MILD   . OSTEOARTHRITIS, KNEE   . INSOMNIA   . DYSPNEA   . CHEST PAIN-UNSPECIFIED   . Hypertension   . OSA on CPAP 05/31/2013  . Prostate cancer     His Past Surgical History Is Significant For: Past Surgical History  Procedure Laterality Date  . Lumbar laminectomy  Late 1990's  . Prostate biopsy    . Elbow surgery    . Hand surgery      His Family History Is Significant For: Family History  Problem Relation Age of Onset  . Cancer Father     prostate    His Social History Is Significant For: History   Social History  . Marital Status: Divorced    Spouse Name: N/A    Number of Children: 1  . Years of Education: college   Occupational History  . CONSULTANT    Social History Main Topics  . Smoking status: Former Smoker    Types: Cigars  . Smokeless tobacco: Never Used     Comment: Former 3 ppd smoker for 30 years quit heavy smoking 15 years ago  . Alcohol Use: 6.0 oz/week    12 drink(s) per week  . Drug Use: No  . Sexual Activity: Yes   Other Topics Concern  . None   Social History Narrative  . None    His Allergies Are:  Allergies  Allergen Reactions  . Codeine     REACTION: Nausea  :   His Current Medications Are:  Outpatient Encounter Prescriptions as of 04/14/2014  Medication Sig  . amLODipine (NORVASC) 10 MG tablet Take 10 mg by mouth daily.  Marland Kitchen aspirin 81 MG tablet Take 81 mg by mouth daily.  Marland Kitchen BENICAR 40 MG tablet Take 1 tablet by mouth daily.  Marland Kitchen ezetimibe (ZETIA) 10 MG tablet Take 10 mg by mouth daily.  Marland Kitchen levocetirizine (XYZAL) 5 MG tablet Take 1 tablet by mouth daily.  . montelukast (SINGULAIR) 10 MG tablet Take 1 tablet by mouth daily.  .  Multiple Vitamin (MULTIVITAMIN) tablet Take 1 tablet by mouth daily.  . nebivolol (BYSTOLIC) 5 MG tablet Take 5 mg by mouth at bedtime.  . QNASL 80 MCG/ACT AERS   . spironolactone (ALDACTONE) 25 MG tablet   . tamsulosin (FLOMAX) 0.4 MG CAPS capsule   . VIAGRA 100 MG tablet Take 1 tablet by mouth as needed.  :  Review of Systems:  Out of a complete 14 point review of systems, all are reviewed and negative with the exception of  these symptoms as listed below:   Review of Systems  Allergic/Immunologic: Positive for environmental allergies.    Objective:  Neurologic Exam  Physical Exam Physical Examination:   Filed Vitals:   04/14/14 1153  BP: 121/74  Pulse: 63    General Examination: The patient is a very pleasant 58 y.o. male in no acute distress. He appears well-developed and well-nourished and very well groomed.   HEENT: Normocephalic, atraumatic, pupils are equal, round and reactive to light and accommodation. Extraocular tracking is good without limitation to gaze excursion or nystagmus noted. Normal smooth pursuit is noted. Hearing is grossly intact. Face is symmetric with normal facial animation and normal facial sensation. Speech is clear with no dysarthria noted. There is no hypophonia. There is no lip, neck/head, jaw or voice tremor. Neck is supple with full range of passive and active motion. There are no carotid bruits on auscultation. Oropharynx exam reveals: mild mouth dryness, good dental hygiene and moderate airway crowding, due to significantly enlarged uvula which also appears irritated and swollen. His soft palate is also thick and redundant appearing. Mallampati is class III. Tongue protrudes centrally and palate elevates symmetrically. On nasal inspection, he has mild erythema and mucosal swelling on the right more than left nostril.   Chest: Clear to auscultation without wheezing, rhonchi or crackles noted.  Heart: S1+S2+0, regular and normal without murmurs, rubs  or gallops noted.   Abdomen: Soft, non-tender and non-distended with normal bowel sounds appreciated on auscultation.  Extremities: There is trace pitting edema in the distal lower extremities bilaterally.  Skin: Warm and dry without trophic changes noted. There are no varicose veins.  Musculoskeletal: exam reveals no obvious joint deformities, tenderness or joint swelling or erythema.   Neurologically:  Mental status: The patient is awake, alert and oriented in all 4 spheres. His memory, attention, language and knowledge are appropriate. There is no aphasia, agnosia, apraxia or anomia. Speech is clear with normal prosody and enunciation. Thought process is linear. Mood is congruent and affect is normal.  Cranial nerves are as described above under HEENT exam.  Motor exam: Normal bulk, strength and tone is noted. There is no drift, tremor or rebound. Romberg is negative. Reflexes are 1+ throughout. Toes are downgoing bilaterally. Fine motor skills are intact with normal finger taps, normal hand movements, normal rapid alternating patting, normal foot taps and normal foot agility.  Cerebellar testing shows no dysmetria or intention tremor on finger to nose testing. There is no truncal or gait ataxia.  Sensory exam is intact to light touch, PP and temperature in the UEs and LEs.  Gait, station and balance are unremarkable. No veering to one side is noted. No leaning to one side is noted. Posture is age-appropriate and stance is narrow based. No problems turning are noted. He turns en bloc.   Assessment and Plan:   In summary, Jasun Elting is a very pleasant 58 year old male with an underlying medical history of hypertension, heart disease, aortic stenosis, osteoarthritis, hyperlipidemia, allergies, and anxiety, who presents for followup consultation of his obstructive sleep apnea. He is on CPAP treatment at a pressure of 13 cwp with excellent compliance and good results. His physical exam  remains stable and he indicates good results with the use of CPAP, and good tolerance of the pressure and mask. His most recent download indicates appropriate treatment level but at times high leak, however, he indicates that he may need a new mask and I prescribed a new set of supplies for  him as well as a chin strap so he can try that. He is advised to get in touch with his home health company regarding his supplies. At this juncture, I can see him back on a yearly basis.  We again talked about trying to maintaining a healthy lifestyle in general. I encouraged the patient to eat healthy, exercise daily and keep well hydrated, to keep a scheduled bedtime and wake time routine, to not skip any meals and eat healthy snacks in between meals and to have protein with every meal. I stressed the importance of regular exercise.   I answered all his questions today and the patient was in agreement with the above outlined plan. I encouraged him to call with any interim questions, concerns, problems or updates.

## 2014-04-14 NOTE — Progress Notes (Signed)
Met with patient as part of Prostate [facility].  Reintroduced my role as his navigator and encouraged him to call as he proceeds with treatments and appointments at [facility].  Provided the accompanying Care Plan Summary:    Care Plan Summary  Name: Alejandro [name] DOB:  1955/12/24  Your Medical Team:   Urologist -  Dr. Raynelle Bring, [facility]  Radiation Oncologist - Dr. Tyler Pita, [facility]   Medical Oncologist - Dr. Zola Button, [place]  Recommendations: 1) Continuation of androgen deprivation therapy for about 3 months. 2) Additional imaging to determine response. 3) Radiation therapy depending on response to hormone therapy. * These recommendations are based on information available as of today's consult.      Recommendations may change depending on the results of further tests or exams. Next Steps: 1) Imaging to be scheduled by Dr. Karsten Ro after hormone therapy completed. 2) Dr. Karsten Ro to refer to Dr. Tammi Klippel.  When appointments need to be scheduled, you will be contacted by [facility] and/or [facility].  Questions?  Please do not hesitate to call Alejandro Orem, RN, BSN, [facility] at [phone] with any questions or concerns.  Alejandro [name] is Counsellor and is available to assist you while you're receiving your medical care at [facility].   I encouraged him to call me with any questions or concerns as his treatments progress.  He indicated understanding.  Alejandro Orem, RN, BSN, [place] at [place] [phone]

## 2014-04-14 NOTE — Progress Notes (Signed)
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 Has one son, Educational psychologist III, who resides in [place] and Human resources officer. Patient is divorced. Patient reports night sweats and weight changes. Reports he has sinus problems. Reports intermittent back and joint pain. Reports anxiety and hot flashes. Reports he has swollen lymph nodes. All information per physical assessment worksheet in packet mailed home to patient.

## 2014-04-14 NOTE — Consult Note (Signed)
Chief Complaint  Prostate Cancer   Reason For Visit  Reason for consult: To discuss treatment options for high risk prostate cancer. Physician requesting consult: Dr. Mark Ottelin and Dr. Firas Shadad PCP: Dr. John Russo Location of visit: Prostate multidisciplinary clinic at the [facility]   History of Present Illness  Mr. Alejandro [name] is a 58 year old gentleman with who was noted to have a PSA of 6.4 in June 2015.  His DRE indicated right sided induration.  He underwent a prostate needle biopsy on 02/02/14 by Dr. Ottelin which demonstrated Gleason 4+4=8 adenocarcinoma of the prostate with 12 out of 12 biopsy cores positive for malignancy. Staging studies were performed on 03/07/14. A bone scan did not demonstrate any concern for metastatic disease. A CT scan of the pelvis demonstrated bilateral pathologic enlarged inguinal lymph nodes and bilateral internal iliac lymph nodes up to 1.5 cm. There were also multiple mesorectal lymph nodes that were enlarged. He was treated with androgen deprivation with degarelix by Dr. Ottelin with plans to perform a follow up CT scan to assess response to treatment. He has since seen Dr. Shadad in consultation.  He has father with a history of prostate cancer.  ** His medical comorbidities include a history of coronary artery disease, hypertension, obstructive sleep apnea, and dyslipidemia.  TNM stage: cT2a N1 M0 PSA: 6.4 Gleason score: 4+4=8 (possible pattern five on review of pathology today) Biopsy (02/02/14): 1212 cores positive    Left: L apex (2/2 cores, 95% - 4+4=8, 80% - 4+4=8, PNI), L lateral mid (95%, 4+4=8), L mid (95%, 4+4=8), L lateral base (95%, 4+4=8), L base (90%, 4+4=8)    Right: R apex (2/2 cores, 20% - 4+4=8, 30% - 4+3=7), R mid (70%, 4+4=8, PNI), R lateral mid (70%, 4+3=7), R base (70%, 4+4=8), R lateral base (95%, 4+4=8) Prostate volume: 61.5 cc  Nomogram OC disease: 45% EPE: 54% SVI: 16% LNI: 15% PFS (surgery): 54% at 6  years, 38% at 10 years  Urinary function: He does have significant baseline voiding symptoms. He takes tamsulosin. IPSS is 23. Erectile function: He does have erectile dysfunction.   Past Medical History  1. History of Anxiety (300.00)  2. History of Coronary artery disease (414.00)  3. History of hypercholesterolemia (V12.29)  4. History of hypertension (V12.59)  5. History of sleep apnea (V13.89)  Surgical History  1. History of Biopsy Of The Prostate Needle  2. History of Elbow Surgery  3. History of Hand Repair  4. History of Lumbar Vertebral Fusion  Current Meds  1. AmLODIPine Besylate 10 MG Oral Tablet;  Therapy: (Recorded:22Jun2015) to Recorded  2. Benicar 40 MG Oral Tablet;  Therapy: (Recorded:22Jun2015) to Recorded  3. Bystolic 20 MG Oral Tablet;  Therapy: (Recorded:22Jun2015) to Recorded  4. Ecotrin 325 MG Oral Tablet Delayed Release;  Therapy: (Recorded:22Jun2015) to Recorded  5. Levocetirizine Dihydrochloride 5 MG Oral Tablet;  Therapy: (Recorded:22Jun2015) to Recorded  6. Multi-Day TABS;  Therapy: (Recorded:22Jun2015) to Recorded  7. Singulair 10 MG Oral Tablet;  Therapy: (Recorded:22Jun2015) to Recorded  8. Tamsulosin HCl - 0.4 MG Oral Capsule; Take 1 capsule by mouth at bedtime;  Therapy: 22Jun2015 to (Evaluate:16Jun2016)  Requested for: 22Jun2015; Last  Rx:22Jun2015 Ordered  9. Zetia 10 MG Oral Tablet;  Therapy: (Recorded:22Jun2015) to Recorded  Allergies  1. No Known Drug Allergies  Family History  1. Family history of prostate cancer (V16.42) : Father  Social History   Alcohol use (V49.89)   Denied: History of Caffeine use     Divorced   Former smoker (V15.82)  Physical Exam Constitutional: Well nourished and well developed . No acute distress.    Results/Data  I have reviewed his medical records, PSA results, pathology report, and imaging studies today with findings as dictated above.  We have specifically reviewed his pathology slides and  imaging studies in conference today.     Assessment  1. Adenocarcinoma of prostate (185)  2. Pelvic lymphadenopathy (785.6)  Discussion/Summary  1.  Metastatic prostate cancer: I met with Mr. Kabir today and discussed his clinical situation which indicates a very high volume and high grade prostate cancer with evidence of multiple lymph node metastases.  We discussed surgical treatment for prostate cancer and he understands that surgical therapy would likely not providing any benefit in this situation based on the high likelihood of metastatic disease.  He has begun androgen deprivation therapy and currently is planning on having repeat imaging in the next few months to assess his response to therapy.  He is scheduled to see both Dr. Manning and Dr. Shadad today.  During our discussion in conference, it was recommended that he continue with systemic androgen deprivation therapy with the possibility of undergoing external beam radiation therapy if he has a good response to initial androgen deprivation therapy based on his repeat imaging.  There was also a discussion that he may benefit from a sodium fluoride PET scan to further evaluate him for metastatic disease prior to undergoing radiation therapy to see if this would truly be beneficial.  He will plan to follow up with Dr. Shadad and Dr. Ottelin and I communicated these recommendations to Dr. Ottelin today.  All questions were answered to the patient stated satisfaction.  Cc: Dr. Mark Ottelin Dr. Firas Shadad Dr. Matt Manning Dr. John Russo   A total of 15 minutes were spent in the overall care of the patient today with 15 minutes in direct face to face consultation.    Signatures Electronically signed by :  , M.D.; Apr 14 2014 12:26PM EST   

## 2014-04-14 NOTE — Progress Notes (Signed)
Hematology and Oncology Follow Up Visit  Alejandro [name] [id number] Nov 05, 1955 58 y.o. 04/14/2014 9:24 AM [name], MDRusso, [name], MD   Principle Diagnosis: 58 year old with prostate cancer diagnosed in July of 2015. He had a PSA of 6.4 and a Gleason score 4+4 equals 8 high volume disease. He was also found to have pelvic adenopathy.   Prior Therapy: Status post biopsy done on July of 2015 which showed a high volume disease. He had prostate cancer and all cores and the majority of his cancer was Gleason score 4+4 equals 8. Of note, his initial digital rectal examination showed right-sided induration. His prostate volume was around 62 cc.   Current therapy: Androgen deprivation.   Interim History:  Mr. Alejandro [name] was evaluated today in the prostate cancer multidisciplinary clinic for his above diagnosis. Since his last visit, he has been asymptomatic and as mentioned was started on androgen deprivation. He does not report any complications related to androgen deprivation. He has not reported any pelvic pain or discomfort. Has not reported any bony pain or change in his performance status. He does report some hot flashes that have been reasonably managed. He has not reported any headaches or vision or double vision. Has not reported any syncope. He does not report any fevers chills or sweats. He does report some mild fatigue. It is not pleuritic chest pain palpitation or orthopnea. Does not report any nausea vomiting or abdominal pain. Does not report any hematochezia or melena. He did not report any skeletal complaints of arthralgias or myalgias. He reports no constitutional symptoms of weight loss or appetite changes. He continues to work full-time without any decline. He does not report any lymphadenopathy or petechiae. Does not report any clotting or thrombosis. Since his review of systems unremarkable.     Medications: I have reviewed the patient's current medications.  Current  Outpatient Prescriptions  Medication Sig Dispense Refill  . amLODipine (NORVASC) 10 MG tablet Take 10 mg by mouth daily.      Marland Kitchen aspirin 81 MG tablet Take 81 mg by mouth daily.      Marland Kitchen BENICAR 40 MG tablet Take 1 tablet by mouth daily.      Marland Kitchen ezetimibe (ZETIA) 10 MG tablet Take 10 mg by mouth daily.      Marland Kitchen levocetirizine (XYZAL) 5 MG tablet Take 1 tablet by mouth daily.      . montelukast (SINGULAIR) 10 MG tablet Take 1 tablet by mouth daily.      . Multiple Vitamin (MULTIVITAMIN) tablet Take 1 tablet by mouth daily.      . nebivolol (BYSTOLIC) 5 MG tablet Take 5 mg by mouth at bedtime.      . QNASL 80 MCG/ACT AERS       . spironolactone (ALDACTONE) 25 MG tablet       . tamsulosin (FLOMAX) 0.4 MG CAPS capsule       . VIAGRA 100 MG tablet Take 1 tablet by mouth as needed.       No current facility-administered medications for this visit.     Allergies:  Allergies  Allergen Reactions  . Codeine     REACTION: Nausea    Past Medical History, Surgical history, Social history, and Family History were reviewed and updated.   Physical Exam: There were no vitals taken for this visit. ECOG: 0 General appearance: alert, cooperative and appears stated age Head: Normocephalic, without obvious abnormality Neck: no adenopathy Lymph nodes: Cervical, supraclavicular, and axillary nodes normal. Heart:regular rate  and rhythm, S1, S2 normal, no murmur, click, rub or gallop Lung:chest clear, no wheezing, rales, normal symmetric air entry Abdomin: soft, non-tender, without masses or organomegaly EXT:no erythema, induration, or nodules   Lab Results: Lab Results  Component Value Date   WBC 6.1 03/21/2014   HGB 16.3 03/21/2014   HCT 48.5 03/21/2014   MCV 93.8 03/21/2014   PLT 267 03/21/2014     Chemistry      Component Value Date/Time   NA 136 03/21/2014 1042   NA 136 06/13/2008 0959   K 5.1 03/21/2014 1042   K 4.2 06/13/2008 0959   CL 98 06/13/2008 0959   CO2 28 03/21/2014 1042   CO2 29  06/13/2008 0959   BUN 18.3 03/21/2014 1042   BUN 13 06/13/2008 0959   CREATININE 1.1 03/21/2014 1042   CREATININE 1.0 06/13/2008 0959      Component Value Date/Time   CALCIUM 10.9* 03/21/2014 1042   CALCIUM 9.9 06/13/2008 0959   ALKPHOS 119 03/21/2014 1042   AST 57* 03/21/2014 1042   ALT 80* 03/21/2014 1042   BILITOT 0.51 03/21/2014 1042      Impression and Plan:  58 year old gentleman with the following issues:  1.Prostate cancer diagnosed in July of 2015. He presented with a PSA of 6.4 and some mild lower urinary symptoms. His biopsy showed a Gleason score 4+4 equals 8 high-volume disease and the majority of the cores. His imaging studies were reviewed and showed some mild adenopathy that is suspicious for metastatic disease. His case was discussed today in the prostate cancer multidisciplinary clinic. His pathology was reviewed by the reviewing pathologists. His imaging studies was also reviewed today with radiology.  The plan at this point is to continue androgen depravation and repeat imaging studies in the form of CT scan abdomen and pelvis in about 3 months. It has excellent response, radiation therapy might serve a role in salvage his disease and might offer him longer disease-free survival. Dr. Tammi Klippel will discuss this with him today as a part of his clinic evaluation. Dr. Alinda Money did not feel that salvage surgery he has any well at this point.  2. Lymphadenopathy:  The differential diagnosis would include reactive versus metastatic prostate cancer. Lymphoproliferative disorder it is unlikely at this point. The utility of PET scan was discussed today with the patient and radiology and refill has limited value at this time. His laboratory data do not suggest any lymphocytosis or any suggestion of any hematological disorder.      [facility], MD 9/11/20159:24 AM

## 2014-04-15 NOTE — Addendum Note (Signed)
Addended by: Wyatt Portela on: 04/15/2014 08:40 AM   Modules accepted: Orders

## 2014-04-17 ENCOUNTER — Telehealth: Payer: Self-pay | Admitting: Oncology

## 2014-04-17 NOTE — Telephone Encounter (Signed)
s.w. pt and advised on NOV appt....pt was already aware per [facility]

## 2014-06-13 ENCOUNTER — Other Ambulatory Visit: Payer: BC Managed Care – PPO

## 2014-06-13 ENCOUNTER — Encounter (HOSPITAL_COMMUNITY): Payer: Self-pay

## 2014-06-13 ENCOUNTER — Ambulatory Visit (HOSPITAL_COMMUNITY)
Admission: RE | Admit: 2014-06-13 | Discharge: 2014-06-13 | Disposition: A | Discharge status: Home / Self Care | Payer: BC Managed Care – PPO | Source: Ambulatory Visit | Attending: Oncology | Admitting: Oncology

## 2014-06-13 ENCOUNTER — Other Ambulatory Visit (HOSPITAL_BASED_OUTPATIENT_CLINIC_OR_DEPARTMENT_OTHER): Payer: BC Managed Care – PPO

## 2014-06-13 DIAGNOSIS — K5730 Diverticulosis of large intestine without perforation or abscess without bleeding: Secondary | ICD-10-CM | POA: Insufficient documentation

## 2014-06-13 DIAGNOSIS — C61 Malignant neoplasm of prostate: Secondary | ICD-10-CM | POA: Diagnosis present

## 2014-06-13 DIAGNOSIS — K760 Fatty (change of) liver, not elsewhere classified: Secondary | ICD-10-CM | POA: Diagnosis not present

## 2014-06-13 LAB — CBC WITH DIFFERENTIAL/PLATELET
BASO%: 1 % (ref 0.0–2.0)
Basophils Absolute: 0.1 10*3/uL (ref 0.0–0.1)
EOS%: 2.1 % (ref 0.0–7.0)
Eosinophils Absolute: 0.1 10*3/uL (ref 0.0–0.5)
HCT: 42.4 % (ref 38.4–49.9)
HGB: 14.5 g/dL (ref 13.0–17.1)
LYMPH%: 28.5 % (ref 14.0–49.0)
MCH: 31.5 pg (ref 27.2–33.4)
MCHC: 34.2 g/dL (ref 32.0–36.0)
MCV: 92.2 fL (ref 79.3–98.0)
MONO#: 0.9 10*3/uL (ref 0.1–0.9)
MONO%: 14.7 % — ABNORMAL HIGH (ref 0.0–14.0)
NEUT#: 3.3 10*3/uL (ref 1.5–6.5)
NEUT%: 53.7 % (ref 39.0–75.0)
Platelets: 202 10*3/uL (ref 140–400)
RBC: 4.6 10*6/uL (ref 4.20–5.82)
RDW: 12.7 % (ref 11.0–14.6)
WBC: 6.2 10*3/uL (ref 4.0–10.3)
lymph#: 1.8 10*3/uL (ref 0.9–3.3)

## 2014-06-13 LAB — COMPREHENSIVE METABOLIC PANEL (CC13)
ALT: 126 U/L — ABNORMAL HIGH (ref 0–55)
AST: 104 U/L — ABNORMAL HIGH (ref 5–34)
Albumin: 4 g/dL (ref 3.5–5.0)
Alkaline Phosphatase: 96 U/L (ref 40–150)
Anion Gap: 10 mEq/L (ref 3–11)
BUN: 18.8 mg/dL (ref 7.0–26.0)
CO2: 25 mEq/L (ref 22–29)
Calcium: 10 mg/dL (ref 8.4–10.4)
Chloride: 101 mEq/L (ref 98–109)
Creatinine: 1 mg/dL (ref 0.7–1.3)
Glucose: 121 mg/dl (ref 70–140)
Potassium: 5.4 mEq/L — ABNORMAL HIGH (ref 3.5–5.1)
Sodium: 135 mEq/L — ABNORMAL LOW (ref 136–145)
Total Bilirubin: 0.63 mg/dL (ref 0.20–1.20)
Total Protein: 7.5 g/dL (ref 6.4–8.3)

## 2014-06-13 MED ORDER — IOHEXOL 300 MG/ML  SOLN
100.0000 mL | Freq: Once | INTRAMUSCULAR | Status: AC | PRN
Start: 2014-06-13 — End: 2014-06-13
  Administered 2014-06-13: 100 mL via INTRAVENOUS

## 2014-06-14 ENCOUNTER — Ambulatory Visit (HOSPITAL_BASED_OUTPATIENT_CLINIC_OR_DEPARTMENT_OTHER): Payer: BC Managed Care – PPO | Admitting: Oncology

## 2014-06-14 VITALS — BP 123/60 | HR 73 | Temp 98.5°F | Resp 18 | Ht 70.0 in | Wt 257.8 lb

## 2014-06-14 DIAGNOSIS — C61 Malignant neoplasm of prostate: Secondary | ICD-10-CM

## 2014-06-14 DIAGNOSIS — R591 Generalized enlarged lymph nodes: Secondary | ICD-10-CM

## 2014-06-14 LAB — PSA: PSA: 0.38 ng/mL (ref <=4.00)

## 2014-06-14 NOTE — Progress Notes (Signed)
Hematology and Oncology Follow Up Visit  Alejandro [name] [id number] [birth year] 58 y.o. 06/14/2014 1:53 PM [name], MDRusso, [name], MD   Principle Diagnosis: 58 year old with prostate cancer diagnosed in July of 2015. He had a PSA of 6.4 and a Gleason score 4+4 equals 8 high volume disease. He was also found to have pelvic adenopathy.   Prior Therapy: Status post biopsy done on July of 2015 which showed a high volume disease. He had prostate cancer and all cores and the majority of his cancer was Gleason score 4+4 equals 8. Of note, his initial digital rectal examination showed right-sided induration. His prostate volume was around 62 cc.   Current therapy: Androgen deprivation.   Interim History:  Alejandro [name] presents today for a follow-up visit. Since his last visit, he has tolerated androgen deprivation without any major complications. He reports occasional hot flashes that no other complaints. He has not reported any weakness fatigue or tiredness. He has not reported any pelvic pain or discomfort. Has not reported any bony pain or change in his performance status. He does report some hot flashes that have been reasonably managed. He has not reported any headaches or vision or double vision. Has not reported any syncope. He does not report any fevers chills or sweats. He does report some mild fatigue. It is not pleuritic chest pain palpitation or orthopnea. Does not report any nausea vomiting or abdominal pain. Does not report any hematochezia or melena. He did not report any skeletal complaints of arthralgias or myalgias. He reports no constitutional symptoms of weight loss or appetite changes. He continues to work full-time without any decline. He does not report any lymphadenopathy or petechiae. Does not report any clotting or thrombosis. Since his review of systems unremarkable.     Medications: I have reviewed the patient's current medications.  Current Outpatient  Prescriptions  Medication Sig Dispense Refill  . amLODipine (NORVASC) 10 MG tablet Take 10 mg by mouth daily.    Marland Kitchen aspirin 81 MG tablet Take 81 mg by mouth daily.    Marland Kitchen BENICAR 40 MG tablet Take 1 tablet by mouth daily.    Marland Kitchen BYSTOLIC 10 MG tablet   4  . ezetimibe (ZETIA) 10 MG tablet Take 10 mg by mouth daily.    Marland Kitchen levocetirizine (XYZAL) 5 MG tablet Take 1 tablet by mouth daily.    . montelukast (SINGULAIR) 10 MG tablet Take 1 tablet by mouth daily.    . Multiple Vitamin (MULTIVITAMIN) tablet Take 1 tablet by mouth daily.    Marland Kitchen spironolactone (ALDACTONE) 25 MG tablet     . tamsulosin (FLOMAX) 0.4 MG CAPS capsule     . VIAGRA 100 MG tablet Take 1 tablet by mouth as needed.     No current facility-administered medications for this visit.     Allergies:  Allergies  Allergen Reactions  . Codeine     REACTION: Nausea    Past Medical History, Surgical history, Social history, and Family History were reviewed and updated.   Physical Exam: Blood pressure 123/60, pulse 73, temperature 98.5 F (36.9 C), temperature source Oral, resp. rate 18, height 5\' 10"  (1.778 m), weight 257 lb 12.8 oz (116.937 kg). ECOG: 0 General appearance: alert, cooperative and appears stated age Head: Normocephalic, without obvious abnormality Neck: no adenopathy Lymph nodes: Cervical, supraclavicular, and axillary nodes normal. Heart:regular rate and rhythm, S1, S2 normal, no murmur, click, rub or gallop Lung:chest clear, no wheezing, rales, normal symmetric air entry Abdomin: soft,  non-tender, without masses or organomegaly EXT:no erythema, induration, or nodules   Lab Results: Lab Results  Component Value Date   WBC 6.2 06/13/2014   HGB 14.5 06/13/2014   HCT 42.4 06/13/2014   MCV 92.2 06/13/2014   PLT 202 06/13/2014     Chemistry      Component Value Date/Time   NA 135* 06/13/2014 0944   NA 136 06/13/2008 0959   K 5.4* 06/13/2014 0944   K 4.2 06/13/2008 0959   CL 98 06/13/2008 0959   CO2 25  06/13/2014 0944   CO2 29 06/13/2008 0959   BUN 18.8 06/13/2014 0944   BUN 13 06/13/2008 0959   CREATININE 1.0 06/13/2014 0944   CREATININE 1.0 06/13/2008 0959      Component Value Date/Time   CALCIUM 10.0 06/13/2014 0944   CALCIUM 9.9 06/13/2008 0959   ALKPHOS 96 06/13/2014 0944   AST 104* 06/13/2014 0944   ALT 126* 06/13/2014 0944   BILITOT 0.63 06/13/2014 0944     Results for [name], [name] (MRN [id number]) as of 06/14/2014 12:48  Ref. Range 03/21/2014 10:43 06/13/2014 09:44  PSA Latest Range: <=4.00 ng/mL 1.87 0.38   EXAM: CT ABDOMEN AND PELVIS WITH CONTRAST  TECHNIQUE: Multidetector CT imaging of the abdomen and pelvis was performed using the standard protocol following bolus administration of intravenous contrast.  CONTRAST: 150mL OMNIPAQUE IOHEXOL 300 MG/ML SOLN  COMPARISON: None.  FINDINGS: Lower Chest: Unremarkable.  Hepatobiliary: Moderate diffuse hepatic steatosis noted. No liver masses are identified. Gallbladder is unremarkable.  Pancreas: No mass, inflammatory changes, or other parenchymal abnormality identified.  Spleen: Within normal limits in size and appearance.  Adrenal Glands: No mass identified.  Kidneys/Urinary Tract: No masses identified. No evidence of hydronephrosis.  Stomach/Bowel/Peritoneum: No evidence of wall thickening, mass, or obstruction. Colonic diverticulosis noted, without evidence of diverticulitis.  Vascular/Lymphatic: No pathologically enlarged lymph nodes identified. No other significant abnormality identified.  Reproductive: No mass or other significant abnormality identified. Prostate and seminal vesicles are unremarkable in appearance.  Other: None.  Musculoskeletal: No suspicious bone lesions identified.  IMPRESSION: No evidence of abdominal or pelvic metastatic disease.  Diverticulosis. No radiographic evidence of diverticulitis.  Moderate hepatic steatosis.   Impression  and Plan:  58 year old gentleman with the following issues:  1.Prostate cancer diagnosed in July of 2015. He presented with a PSA of 6.4 and some mild lower urinary symptoms. His biopsy showed a Gleason score 4+4 equals 8 high-volume disease and the majority of the cores. His CT scan on 06/13/2014 was reviewed today and showed no evidence of any residual disease. The recommendation at this point is to proceed with definitive radiation therapy and he has been evaluated by Dr. Tammi Klippel before. He is also receiving another opinion at [facility]  in [place] and potentially considering treatment for that at this time. The plan is to continue with vision depravation at this time.  2. Lymphadenopathy:  The differential diagnosis would include reactive versus metastatic prostate cancer. Lymphoproliferative disorder it is unlikely at this point.   3. Follow-up: He will let me know in the near future about his treatment plans and will assist in his care any way possible.      Zola Button, MD 11/11/20151:53 PM

## 2014-10-20 ENCOUNTER — Telehealth: Payer: Self-pay | Admitting: *Deleted

## 2014-10-20 DIAGNOSIS — G4733 Obstructive sleep apnea (adult) (pediatric): Secondary | ICD-10-CM

## 2014-10-20 DIAGNOSIS — Z9989 Dependence on other enabling machines and devices: Secondary | ICD-10-CM

## 2014-10-20 NOTE — Telephone Encounter (Signed)
Patient contacted the office and states his CPAP machine is no longer working and that his DME company [employer]) has gone out of business.  Patient needs order for new CPAP machine and supplies along with a change in DME company to [facility].  Upon completion of order I will facilitate DME referral and notify patient.

## 2014-10-20 NOTE — Telephone Encounter (Signed)
New cpap order placed  

## 2015-04-12 ENCOUNTER — Telehealth: Payer: Self-pay

## 2015-04-12 NOTE — Telephone Encounter (Signed)
Left message to bring SD card from CPAP machine to appt on Monday.

## 2015-04-16 ENCOUNTER — Ambulatory Visit (INDEPENDENT_AMBULATORY_CARE_PROVIDER_SITE_OTHER): Payer: BLUE CROSS/BLUE SHIELD | Admitting: Neurology

## 2015-04-16 ENCOUNTER — Encounter: Payer: Self-pay | Admitting: Neurology

## 2015-04-16 VITALS — BP 182/98 | HR 78 | Resp 18 | Ht 70.0 in | Wt 263.0 lb

## 2015-04-16 DIAGNOSIS — Z9989 Dependence on other enabling machines and devices: Secondary | ICD-10-CM

## 2015-04-16 DIAGNOSIS — C61 Malignant neoplasm of prostate: Secondary | ICD-10-CM

## 2015-04-16 DIAGNOSIS — G4733 Obstructive sleep apnea (adult) (pediatric): Secondary | ICD-10-CM

## 2015-04-16 DIAGNOSIS — R635 Abnormal weight gain: Secondary | ICD-10-CM

## 2015-04-16 NOTE — Progress Notes (Signed)
Subjective:    Patient ID: Alejandro [name]. is a 59 y.o. male.  HPI     Interim history:   Alejandro [name] is a very pleasant 60 year old right-handed gentleman with an underlying medical history of hypertension, hyperlipidemia, heart disease, s/p b/l CTS, who presents for followup consultation of Alejandro [name] obstructive sleep apnea, on treatment with CPAP. The patient is unaccompanied today. I last saw Alejandro [name] on 04/14/2014, at which time Alejandro [name] reported flareup of Alejandro [name] allergies and Alejandro [name] was using generic Claritin as well as nasal saline rinses an over-the-counter nasal moisture sprays. Alejandro [name] was on hormone therapy for Alejandro [name] prostate cancer which was diagnosed in July 2015. Alejandro [name] had seen Dr. Einar Gip in cardiology as well.    Today, 04/16/2015: I reviewed Alejandro [name] CPAP compliance data from 03/17/2015 through 04/15/2015 which is a total of 30 days during which time Alejandro [name] used Alejandro [name] machine 29 days with percent used days greater than 4 hours at 97%, indicating excellent compliance, with an average usage of 8 hours and 9 minutes, residual AHI at 2.2 per hour, leak high with the 95th percentile at 47.8 L/m on a pressure of 13 cm with EPR of 2.  Today, 04/16/2015: Alejandro [name] reports overall doing well with CPAP. Nevertheless, Alejandro [name] needs new supplies. Alejandro [name] has not had a new mask in over a year. Alejandro [name] original DME company ran out of business. Alejandro [name] has been going to [place] for Alejandro [name] cancer treatment and follow-up. Alejandro [name] 56 year old son lives up [place] so Alejandro [name] gets to see Alejandro [name]. For Alejandro [name] prostate cancer Alejandro [name] had lymph node removal and has undergone radioactive seed implants. Alejandro [name] has gained weight.  Previously:  I reviewed Alejandro [name] compliance data from 01/01/2014 to 04/13/2014 which is a total of 103 days, during which Alejandro [name] uses CPAP every night except for 3 nights. Percent used days greater than 4 hours was 92%, indicating excellent compliance, residual AHI 1.8 per hour, leak high at 46.8 L per minute at the 95th percentile.  I saw Alejandro [name] on 10/11/2013, at which time Alejandro [name]  reported sleeping better with a nasal mask and Alejandro [name] had discovered a crack in the nasal mask and got a new one. Alejandro [name] felt better overall and sleep was improved. Alejandro [name] had ongoing allergy symptoms and had a change in Alejandro [name] allergy medication.   Unfortunately, in July of this year, Alejandro [name] was diagnosed with prostate cancer.  I saw Alejandro [name] on 05/31/2013, at which time I suggested Alejandro [name] switch to nasal mask. I noted an increase in air leak and residual mild obstructive sleep apnea. Alejandro [name] reported having more issues with allergies and Alejandro [name] was scheduled to see an allergy doctor. Overall, Alejandro [name] felt Alejandro [name] was sleeping much better. Alejandro [name] was compliant with treatment. Alejandro [name] was still trying to be fully compliant. Alejandro [name] was advised by Alejandro [name] DME company regarding mask fitting. I did not increase Alejandro [name] pressure at the time. I reviewed Alejandro [name] download from 03/04/2013 through 09/04/2013 which is a total of 185 days during which time Alejandro [name] used CPAP every night except for 9 days. Average usage was 5 hours and 19 minutes. Percent used days greater than 4 hours was 74%. Residual AHI was 7.4. 95th percentile of leak was 42.6 at a pressure of 13 with EPR of 2. Compared to before, Alejandro [name] leak has improved and Alejandro [name] residual AHI has also improved.    I first met Alejandro [name] on 01/25/2013 at which time I asked Alejandro [name] to come back for sleep study. Alejandro [name] has split-night sleep study on 01/31/2013: Baseline sleep efficiency was reduced at  58.2% with a latency to sleep of 12.5 minutes and wake after sleep onset of 46 minutes with severe sleep fragmentation noted. Alejandro [name] had a markedly increased percentage of stage I sleep, and a reduced percentage of stage II sleep and absence of slow-wave and REM sleep prior to CPAP. Alejandro [name] had a moderate to loud degree of snoring. Alejandro [name] total AHI was 87.6 per hour. Alejandro [name] baseline oxygen saturation was 89%, Alejandro [name] nadir was 83%. Alejandro [name] was then titrated on CPAP between 5-13 cm of water pressure. Alejandro [name] a reduction of Alejandro [name] AHI to 8.8 events per hour at a pressure of 13. Alejandro [name] was noted to have  mild periodic leg movements of sleep during the CPAP titration portion of the test but no significant arousals. Alejandro [name] baseline oxygen saturation with CPAP improved to 93%.   I reviewed Alejandro [name] compliance data from 03/04/2013 through 05/20/2013 which is a total of 78 days during which time Alejandro [name] used it every day except for 4 days. Alejandro [name] average [name] usage is 4 hours and 57 minutes. Percent used days greater than 4 hours was 69% which indicates fair compliance. Alejandro [name] residual AHI was 9.9 per hour with a significant leak at times. Alejandro [name] median leak was 12.6 Alejandro [name] 95th percentile was 64.8 for leak. Alejandro [name] that pressure is 13 with EPR of 2.   Compliance data from 03/04/2013 through 05/30/2013 which is a total of 88 days showed: Percent used days greater than 4 hours was again 69%. Alejandro [name] average usage for all days was 5 hours. Alejandro [name] residual AHI is again noted to be high at 10.4 with a significant leak at times.   Alejandro [name] Past Medical History Is Significant For: Past Medical History  Diagnosis Date  . HYPERCHOLESTEROLEMIA   . ANXIETY   . CAD   . AORTIC STENOSIS, MILD   . OSTEOARTHRITIS, KNEE   . INSOMNIA   . DYSPNEA   . CHEST PAIN-UNSPECIFIED   . Hypertension   . OSA on CPAP 05/31/2013  . Prostate cancer     Alejandro [name] Past Surgical History Is Significant For: Past Surgical History  Procedure Laterality Date  . Lumbar laminectomy  Late 1990's  . Prostate biopsy    . Elbow surgery    . Neuroplasty / transposition median nerve at carpal tunnel bilateral    . Colonoscopy      Alejandro [name] Family History Is Significant For: Family History  Problem Relation Age of Onset  . Cancer Father     prostate    Alejandro [name] Social History Is Significant For: Social History   Social History  . Marital Status: Divorced    Spouse Name: N/A  . Number of Children: 1  . Years of Education: college   Occupational History  . CONSULTANT    Social History Main Topics  . Smoking status: Former Smoker -- 3.00 packs/day for 25 years    Types:  Cigars, Cigarettes  . Smokeless tobacco: Never Used     Comment: Former 3 ppd smoker for 30 years quit heavy smoking 15 years ago  . Alcohol Use: 6.0 oz/week    12 drink(s) per week     Comment: 2 drinks per day  . Drug Use: No  . Sexual Activity: Yes   Other Topics Concern  . None   Social History Narrative    Alejandro [name] Allergies Are:  Allergies  Allergen Reactions  . Codeine     REACTION: Nausea  :   Alejandro [name] Current Medications Are:  Outpatient Encounter Prescriptions as of 04/16/2015  Medication Sig  .  Alirocumab (PRALUENT ) Inject into the skin.  Marland Kitchen amLODipine (NORVASC) 10 MG tablet Take 10 mg by mouth [name].  Marland Kitchen aspirin 81 MG tablet Take 81 mg by mouth [name].  Marland Kitchen BENICAR 40 MG tablet Take 1 tablet by mouth [name].  Marland Kitchen BYSTOLIC 10 MG tablet   . ezetimibe (ZETIA) 10 MG tablet Take 10 mg by mouth [name].  . montelukast (SINGULAIR) 10 MG tablet Take 1 tablet by mouth [name].  . Multiple Vitamin (MULTIVITAMIN) tablet Take 1 tablet by mouth [name].  Marland Kitchen spironolactone (ALDACTONE) 25 MG tablet   . tamsulosin (FLOMAX) 0.4 MG CAPS capsule   . VIAGRA 100 MG tablet Take 1 tablet by mouth as needed.  . [DISCONTINUED] levocetirizine (XYZAL) 5 MG tablet Take 1 tablet by mouth [name].   No facility-administered encounter medications on file as of 04/16/2015.  :  Review of Systems:  Out of a complete 14 point review of systems, all are reviewed and negative with the exception of these symptoms as listed below:  Review of Systems  Neurological:       Patient states that Alejandro [name] needs a new mask for Alejandro [name] CPAP machine.     Objective:  Neurologic Exam  Physical Exam Physical Examination:   Filed Vitals:   04/16/15 1538  BP: 182/98  Pulse: 78  Resp: 18    General Examination: The patient is a very pleasant 59 y.o. male in no acute distress. Alejandro [name] appears well-developed and well-nourished and very well groomed. Alejandro [name] has gained weight.   HEENT: Normocephalic, atraumatic, pupils are equal, round and  reactive to light and accommodation. Extraocular tracking is good without limitation to gaze excursion or nystagmus noted. Normal smooth pursuit is noted. Hearing is grossly intact. Face is symmetric with normal facial animation and normal facial sensation. Speech is clear with no dysarthria noted. There is no hypophonia. There is no lip, neck/head, jaw or voice tremor. Neck is supple with full range of passive and active motion. There are no carotid bruits on auscultation. Oropharynx exam reveals: mild mouth dryness, good dental hygiene and moderate airway crowding, due to significantly enlarged uvula which also appears irritated and swollen. Alejandro [name] soft palate is also thick and redundant appearing. Mallampati is class III. Tongue protrudes centrally and palate elevates symmetrically.    Chest: Clear to auscultation without wheezing, rhonchi or crackles noted.  Heart: S1+S2+0, regular and normal without murmurs, rubs or gallops noted.   Abdomen: Soft, non-tender and non-distended with normal bowel sounds appreciated on auscultation.  Extremities: There is no pitting edema in the distal lower extremities bilaterally.  Skin: Warm and dry without trophic changes noted. There are no varicose veins.  Musculoskeletal: exam reveals no obvious joint deformities, tenderness or joint swelling or erythema.   Neurologically:  Mental status: The patient is awake, alert and oriented in all 4 spheres. Alejandro [name] memory, attention, language and knowledge are appropriate. There is no aphasia, agnosia, apraxia or anomia. Speech is clear with normal prosody and enunciation. Thought process is linear. Mood is congruent and affect is normal.  Cranial nerves are as described above under HEENT exam.  Motor exam: Normal bulk, strength and tone is noted. There is no drift, tremor or rebound. Romberg is negative. Reflexes are 1+ throughout. Toes are downgoing bilaterally. Fine motor skills are intact with normal finger taps, normal  hand movements, normal rapid alternating patting, normal foot taps and normal foot agility.  Cerebellar testing shows no dysmetria or intention tremor on finger to nose testing. There is no  truncal or gait ataxia.  Sensory exam is intact to light touch in the UEs and LEs.  Gait, station and balance are unremarkable. No veering to one side is noted. No leaning to one side is noted. Posture is age-appropriate and stance is narrow based. No problems turning are noted. Alejandro [name] turns en bloc.   Assessment and Plan:   In summary, Reice Canepa is a very pleasant 59 year old male with an underlying medical history of hypertension, heart disease, aortic stenosis, osteoarthritis, hyperlipidemia, allergies, anxiety, and prostate cancer, who presents for followup consultation of Alejandro [name] obstructive sleep apnea. Alejandro [name] is on CPAP treatment at a pressure of 13 cwp with excellent compliance and good results. Alejandro [name] physical exam shows high BP today, and weight gain. Alejandro [name] compliance looks great but Alejandro [name] leak from the mask has increased. Alejandro [name] has not had a new mask in over a year. We will send Alejandro [name] CPAP supply request to new DME company as Alejandro [name] original DME company is out of business. For Alejandro [name] cancer treatments Alejandro [name] has been going to [place].  From my end of things, I suggested Alejandro [name] continue CPAP with ongoing full compliance and Alejandro [name] is commended for Alejandro [name] treatment adherence. I would like to see Alejandro [name] back in a year, sooner if needed. We will fax Alejandro [name] CPAP mask order to a local DME company. I answered all Alejandro [name] questions today and the patient was in agreement with the above. I encouraged Alejandro [name] to call with any interim questions, concerns, problems or updates.  I spent 15 minutes in total face-to-face time with the patient, more than 50% of which was spent in counseling and coordination of care, reviewing test results, reviewing medication and discussing or reviewing the diagnosis of OSA, its prognosis and treatment options.

## 2015-04-16 NOTE — Patient Instructions (Signed)
Please continue using your CPAP regularly. While your insurance requires that you use CPAP at least 4 hours each night on 70% of the nights, I recommend, that you not skip any nights and use it throughout the night if you can. Getting used to CPAP and staying with the treatment long term does take time and patience and discipline. Untreated obstructive sleep apnea when it is moderate to severe can have an adverse impact on cardiovascular health and raise her risk for heart disease, arrhythmias, hypertension, congestive heart failure, stroke and diabetes. Untreated obstructive sleep apnea causes sleep disruption, nonrestorative sleep, and sleep deprivation. This can have an impact on your day to day functioning and cause daytime sleepiness and impairment of cognitive function, memory loss, mood disturbance, and problems focussing. Using CPAP regularly can improve these symptoms.  Keep up the good work! I will see you back in 12 months for sleep apnea check up, and we will get you new supplies.

## 2015-08-21 ENCOUNTER — Encounter: Payer: Self-pay | Admitting: *Deleted

## 2016-04-15 ENCOUNTER — Ambulatory Visit: Payer: BLUE CROSS/BLUE SHIELD | Admitting: Neurology

## 2016-05-01 ENCOUNTER — Other Ambulatory Visit: Payer: Self-pay | Admitting: Internal Medicine

## 2016-05-01 DIAGNOSIS — M545 Low back pain: Secondary | ICD-10-CM

## 2016-05-12 ENCOUNTER — Encounter: Payer: Self-pay | Admitting: Neurology

## 2016-05-12 ENCOUNTER — Ambulatory Visit
Admission: RE | Admit: 2016-05-12 | Discharge: 2016-05-12 | Disposition: A | Discharge status: Home / Self Care | Payer: Commercial Managed Care - HMO | Source: Ambulatory Visit | Attending: Internal Medicine | Admitting: Internal Medicine

## 2016-05-12 DIAGNOSIS — M545 Low back pain, unspecified: Secondary | ICD-10-CM

## 2016-05-13 ENCOUNTER — Telehealth: Payer: Self-pay

## 2016-05-13 NOTE — Telephone Encounter (Signed)
LM for patient to bring CPAP machine or just SD card to appt tomorrow.

## 2016-05-14 ENCOUNTER — Ambulatory Visit (INDEPENDENT_AMBULATORY_CARE_PROVIDER_SITE_OTHER): Payer: 59 | Admitting: Neurology

## 2016-05-14 ENCOUNTER — Encounter: Payer: Self-pay | Admitting: Neurology

## 2016-05-14 VITALS — BP 121/74 | HR 56 | Resp 18 | Ht 70.0 in | Wt 272.0 lb

## 2016-05-14 DIAGNOSIS — C61 Malignant neoplasm of prostate: Secondary | ICD-10-CM | POA: Diagnosis not present

## 2016-05-14 DIAGNOSIS — G4733 Obstructive sleep apnea (adult) (pediatric): Secondary | ICD-10-CM | POA: Diagnosis not present

## 2016-05-14 DIAGNOSIS — Z9989 Dependence on other enabling machines and devices: Secondary | ICD-10-CM

## 2016-05-14 DIAGNOSIS — R635 Abnormal weight gain: Secondary | ICD-10-CM

## 2016-05-14 NOTE — Progress Notes (Signed)
Subjective:    Patient ID: Alejandro [name]. is a 60 y.o. male.  HPI     Interim history:   Alejandro [name] is a very pleasant 60 year old right-handed gentleman with an underlying medical history of hypertension, hyperlipidemia, heart disease, s/p b/l CTS, who presents for followup consultation of his obstructive sleep apnea, established on treatment with CPAP. The patient is unaccompanied today. I last saw him on 04/16/2015, at which time he was compliant with treatment and was doing well. I suggested a one-year checkup. He was going to [place] for his prostate cancer treatment and follow-up. He was s/p lymph node removal and radioactive seed implants. He had gained weight.   Today, 05/14/2016: I reviewed his CPAP compliance data from 04/13/2016 through 05/12/2016 which is a total of 30 days, during which time he used his machine 28 days with percent used days greater than 4 hours at 3%, indicating excellent compliance with an average usage of 8 hours and 22 minutes, residual AHI 3.6 per hour, leak high with the 95th percentile at 56.4 L/m on a pressure of 13 cm with EPR of 2.   Today, 05/14/2016: He reports doing well, needs supplies. He had XRT twice, but had met to hip. Has had more aching, lack of energy. He has gained about 70 lb in 6 years, most in the last 2 years. Compliant with treatment, struggles with allergies, dry eyes, uses zyrtec and prn afrin. Has LBP, spinal stenosis, bulging disc in lower back. He will be seeing Dr. Trenton Gammon.   Previously:  I saw him on 04/14/2014, at which time he reported flareup of his allergies and he was using generic Claritin as well as nasal saline rinses an over-the-counter nasal moisture sprays. He was on hormone therapy for his prostate cancer which was diagnosed in July 2015. He had seen Dr. Einar Gip in cardiology as well.     I reviewed his CPAP compliance data from 03/17/2015 through 04/15/2015 which is a total of 30 days during which time he  used his machine 29 days with percent used days greater than 4 hours at 97%, indicating excellent compliance, with an average usage of 8 hours and 9 minutes, residual AHI at 2.2 per hour, leak high with the 95th percentile at 47.8 L/m on a pressure of 13 cm with EPR of 2.    I reviewed his compliance data from 01/01/2014 to 04/13/2014 which is a total of 103 days, during which he uses CPAP every night except for 3 nights. Percent used days greater than 4 hours was 92%, indicating excellent compliance, residual AHI 1.8 per hour, leak high at 46.8 L per minute at the 95th percentile.   I saw him on 10/11/2013, at which time he reported sleeping better with a nasal mask and he had discovered a crack in the nasal mask and got a new one. He felt better overall and sleep was improved. He had ongoing allergy symptoms and had a change in his allergy medication.   Unfortunately, in July of this year, he was diagnosed with prostate cancer.   I saw him on 05/31/2013, at which time I suggested he switch to nasal mask. I noted an increase in air leak and residual mild obstructive sleep apnea. He reported having more issues with allergies and he was scheduled to see an allergy doctor. Overall, he felt he was sleeping much better. He was compliant with treatment. He was still trying to be fully compliant. He was advised by his DME company  regarding mask fitting. I did not increase his pressure at the time. I reviewed his download from 03/04/2013 through 09/04/2013 which is a total of 185 days during which time he used CPAP every night except for 9 days. Average usage was 5 hours and 19 minutes. Percent used days greater than 4 hours was 74%. Residual AHI was 7.4. 95th percentile of leak was 42.6 at a pressure of 13 with EPR of 2. Compared to before, his leak has improved and his residual AHI has also improved.     I first met him on 01/25/2013 at which time I asked him to come back for sleep study. He has split-night  sleep study on 01/31/2013: Baseline sleep efficiency was reduced at 58.2% with a latency to sleep of 12.5 minutes and wake after sleep onset of 46 minutes with severe sleep fragmentation noted. He had a markedly increased percentage of stage I sleep, and a reduced percentage of stage II sleep and absence of slow-wave and REM sleep prior to CPAP. He had a moderate to loud degree of snoring. His total AHI was 87.6 per hour. His baseline oxygen saturation was 89%, his nadir was 83%. He was then titrated on CPAP between 5-13 cm of water pressure. He a reduction of his AHI to 8.8 events per hour at a pressure of 13. He was noted to have mild periodic leg movements of sleep during the CPAP titration portion of the test but no significant arousals. His baseline oxygen saturation with CPAP improved to 93%.   I reviewed his compliance data from 03/04/2013 through 05/20/2013 which is a total of 78 days during which time he used it every day except for 4 days. His average daily usage is 4 hours and 57 minutes. Percent used days greater than 4 hours was 69% which indicates fair compliance. His residual AHI was 9.9 per hour with a significant leak at times. His median leak was 12.6 his 95th percentile was 64.8 for leak. His that pressure is 13 with EPR of 2.   Compliance data from 03/04/2013 through 05/30/2013 which is a total of 88 days showed: Percent used days greater than 4 hours was again 69%. His average usage for all days was 5 hours. His residual AHI is again noted to be high at 10.4 with a significant leak at times.   His Past Medical History Is Significant For: Past Medical History:  Diagnosis Date  . ANXIETY   . AORTIC STENOSIS, MILD   . CAD   . CHEST PAIN-UNSPECIFIED   . DYSPNEA   . HYPERCHOLESTEROLEMIA   . Hypertension   . INSOMNIA   . OSA on CPAP 05/31/2013  . OSTEOARTHRITIS, KNEE   . Prostate cancer [facility])     His Past Surgical History Is Significant For: Past Surgical History:  Procedure  Laterality Date  . COLONOSCOPY    . ELBOW SURGERY    . LUMBAR LAMINECTOMY  Late 1990's  . NEUROPLASTY / TRANSPOSITION MEDIAN NERVE AT CARPAL TUNNEL BILATERAL    . PROSTATE BIOPSY      His Family History Is Significant For: Family History  Problem Relation Age of Onset  . Cancer Father     prostate    His Social History Is Significant For: Social History   Social History  . Marital status: Divorced    Spouse name: N/A  . Number of children: 1  . Years of education: college   Occupational History  . [employer]   Social History  Main Topics  . Smoking status: Former Smoker    Packs/day: 3.00    Years: 25.00    Types: Cigars, Cigarettes  . Smokeless tobacco: Never Used     Comment: Former 3 ppd smoker for 30 years quit heavy smoking 15 years ago  . Alcohol use 6.0 oz/week    12 drink(s) per week     Comment: 2 drinks per day  . Drug use: No  . Sexual activity: Yes   Other Topics Concern  . None   Social History Narrative  . None    His Allergies Are:  Allergies  Allergen Reactions  . Codeine     REACTION: Nausea  :   His Current Medications Are:  Outpatient Encounter Prescriptions as of 05/14/2016  Medication Sig  . Alirocumab (PRALUENT [place]) Inject into the skin.  Marland Kitchen amLODipine (NORVASC) 10 MG tablet Take 10 mg by mouth daily.  Marland Kitchen aspirin 81 MG tablet Take 81 mg by mouth daily.  Marland Kitchen BENICAR 40 MG tablet Take 1 tablet by mouth daily.  Marland Kitchen BYSTOLIC 10 MG tablet   . ezetimibe (ZETIA) 10 MG tablet Take 10 mg by mouth daily.  . Multiple Vitamin (MULTIVITAMIN) tablet Take 1 tablet by mouth daily.  . tamsulosin (FLOMAX) 0.4 MG CAPS capsule   . VIAGRA 100 MG tablet Take 1 tablet by mouth as needed.  . [DISCONTINUED] montelukast (SINGULAIR) 10 MG tablet Take 1 tablet by mouth daily.  . [DISCONTINUED] spironolactone (ALDACTONE) 25 MG tablet    No facility-administered encounter medications on file as of 05/14/2016.   :  Review of Systems:  Out of  a complete 14 point review of systems, all are reviewed and negative with the exception of these symptoms as listed below: Review of Systems  Neurological:       Patient states that he is doing well with CPAP. Needs new mask and filters.     Objective:  Neurologic Exam  Physical Exam Physical Examination:   Vitals:   05/14/16 1416  BP: 121/74  Pulse: (!) 56  Resp: 18   General Examination: The patient is a very pleasant 60 y.o. male in no acute distress. He appears well-developed and well-nourished and very well groomed. He has gained weight.   HEENT: Normocephalic, atraumatic, pupils are equal, round and reactive to light and accommodation. Extraocular tracking is good without limitation to gaze excursion or nystagmus noted. Normal smooth pursuit is noted. Hearing is grossly intact. Face is symmetric with normal facial animation and normal facial sensation. Speech is Mildly congested sounding with no dysarthria noted. There is no hypophonia. There is no lip, neck/head, jaw or voice tremor. Neck is supple with full range of passive and active motion. There are no carotid bruits on auscultation. Oropharynx exam reveals: mild mouth dryness, good dental hygiene and moderate airway crowding, due to significantly enlarged uvula which also appears irritated and swollen. His soft palate is also thick and redundant appearing, mild Pharyngeal irritation. Mallampati is class III. Tongue protrudes centrally and palate elevates symmetrically.    Chest: Clear to auscultation without wheezing, rhonchi or crackles noted.  Heart: S1+S2+0, regular and normal without murmurs, rubs or gallops noted.   Abdomen: Soft, non-tender and non-distended with normal bowel sounds appreciated on auscultation.  Extremities: There is no pitting edema in the distal lower extremities bilaterally.  Skin: Warm and dry without trophic changes noted. There are no varicose veins.  Musculoskeletal: exam reveals no obvious  joint deformities, tenderness or joint swelling or erythema.  Neurologically:  Mental status: The patient is awake, alert and oriented in all 4 spheres. His memory, attention, language and knowledge are appropriate. There is no aphasia, agnosia, apraxia or anomia. Speech is clear with normal prosody and enunciation. Thought process is linear. Mood is congruent and affect is normal.  Cranial nerves are as described above under HEENT exam.  Motor exam: Normal bulk, strength and tone is noted. There is no drift, tremor or rebound. Romberg is negative. Reflexes are 1+ throughout. Fine motor skills are intact with normal finger taps, normal hand movements, normal rapid alternating patting, normal foot taps and normal foot agility.  Cerebellar testing shows no dysmetria or intention tremor on finger to nose testing. There is no truncal or gait ataxia.  Sensory exam is intact to light touch in the UEs and LEs.  Gait, station and balance: He stands with mild difficulty and reports low back pain. He has mild right hip discomfort. He walks with a slight limp and posture shows mild increase in lumbar kyphosis. Tandem walk is difficult for him.   Assessment and Plan:   In summary, Alejandro [name] is a very pleasant 60 year old male with an underlying medical history of hypertension, heart disease, aortic stenosis, osteoarthritis, hyperlipidemia, allergies, anxiety, and prostate cancer, who presents for followup consultation of his obstructive sleep apnea. He is on CPAP treatment at a pressure of 13 cwp with excellent compliance and ongoing good results. He has been going back and forth to [place] for his prostate cancer treatments. He has had radiation therapy. He has gained weight. He has had ongoing issues with low back pain. He has a history of epidural steroid injections, has seen different specialists over time. He is to go to [facility] for orthopedics and is scheduled to see a neurosurgeon soon.   He needs new supplies for his CPAP and mask is leaking. Part of the leak may be secondary to facial here. Nevertheless, he does need new supplies and a placed an order for this. He is commended for his CPAP treatment adherence. He is reminded to stay well-hydrated and work hard on weight loss. Neurological exam is nonfocal which is reassuring. From my end of things, I suggested he continue CPAP with ongoing full compliance and he is commended for his treatment adherence. I would like to see him back in a year, sooner if needed. We will fax his CPAP mask order to a local DME company. I answered all his questions today and the patient was in agreement with the above. I encouraged him to call with any interim questions, concerns, problems or updates.  I spent 25 minutes in total face-to-face time with the patient, more than 50% of which was spent in counseling and coordination of care, reviewing test results, reviewing medication and discussing or reviewing the diagnosis of OSA, its prognosis and treatment options.

## 2016-05-14 NOTE — Patient Instructions (Signed)
Keep up the good work! I will see you back in 1 year for sleep apnea check up.  Try to hydrate well and keep working on weight loss.

## 2017-01-15 ENCOUNTER — Telehealth: Payer: Self-pay | Admitting: Oncology

## 2017-01-15 NOTE — Telephone Encounter (Signed)
Spoke with patient to advise that we were receiving disability forms to complete for him but weren't able to since he hasn't been seen by Dr. Alen Blew since 06/14/14. Offered to forward these forms to his current provider but patient states that he doesn't have one. Per patient it is "ok to just disregard" these disability forms which I did

## 2017-02-09 ENCOUNTER — Telehealth: Payer: Self-pay

## 2017-02-09 NOTE — Telephone Encounter (Signed)
I received a form from [facility] for disability of this pt. Dr. Rexene Alberts reviewed it and does not think this applicable to what she is treating him before and recommends that pt speak to PCP regarding disability. I called pt. He says that all [employer] wants is his medical records and asked that Dr. Rexene Alberts just write N/A across it and send his medical records. Will send to Hilda Blades in [place] for review.

## 2017-02-10 NOTE — Telephone Encounter (Signed)
Pt records faxed on 02/10/17.

## 2017-03-18 ENCOUNTER — Ambulatory Visit (INDEPENDENT_AMBULATORY_CARE_PROVIDER_SITE_OTHER): Payer: 59 | Admitting: Psychology

## 2017-03-18 DIAGNOSIS — F4323 Adjustment disorder with mixed anxiety and depressed mood: Secondary | ICD-10-CM | POA: Diagnosis not present

## 2017-04-20 ENCOUNTER — Other Ambulatory Visit: Payer: Self-pay | Admitting: Internal Medicine

## 2017-04-20 DIAGNOSIS — R5381 Other malaise: Secondary | ICD-10-CM

## 2017-04-21 ENCOUNTER — Telehealth: Payer: Self-pay | Admitting: Neurology

## 2017-04-21 DIAGNOSIS — G4733 Obstructive sleep apnea (adult) (pediatric): Secondary | ICD-10-CM

## 2017-04-21 DIAGNOSIS — Z9989 Dependence on other enabling machines and devices: Secondary | ICD-10-CM

## 2017-04-21 NOTE — Telephone Encounter (Signed)
Pt called in he is going to be doing extensive traveling and he is wanting to get a small travel CPAP. He has looked at [employer] CPAP on line but it does not have to be that model. Please send RX to [facility]. He also said he may go to [facility] and pick one out.

## 2017-04-22 NOTE — Telephone Encounter (Signed)
I have reached out to [facility] to find out what they need from us. 

## 2017-04-22 NOTE — Telephone Encounter (Signed)
Received this notice from [facility]: "Looks like this pt has only gotten pap supplies from [place]; not a unit. Unfortunately the travel units are not covered by insurance but we still need an order so we have the pressure settings etc."  I called pt to discuss, no answer, left a message asking him to call me back.  Received verbal order from Dr. Rexene Alberts to start pt on travel cpap set at 13 cm (travel cpaps don't have the EPR feature). Order placed, sent to [facility].

## 2017-04-22 NOTE — Addendum Note (Signed)
Addended by: Lester [place]els A on: 04/22/2017 05:11 PM   Modules accepted: Orders

## 2017-04-23 NOTE — Telephone Encounter (Signed)
Patient returned call. Please call and advise.  °

## 2017-04-23 NOTE — Telephone Encounter (Signed)
I called pt and informed him that the order for his travel cpap was sent to [facility] and advised him that insurances don't cover travel cpaps. Pt says that he is aware that the insurance does not cover it. Pt verbalized understanding.

## 2017-05-18 ENCOUNTER — Ambulatory Visit: Payer: 59 | Admitting: Neurology

## 2017-06-16 ENCOUNTER — Telehealth: Payer: Self-pay

## 2017-06-16 NOTE — Telephone Encounter (Signed)
I called pt, left a detailed message on pt's cell phone, per DPR, reminding him to bring his cpap chip to the appt tomorrow with Dr. Rexene Alberts and to call back with any further questions or concerns.

## 2017-06-17 ENCOUNTER — Telehealth: Payer: Self-pay

## 2017-06-17 ENCOUNTER — Ambulatory Visit: Payer: 59 | Admitting: Neurology

## 2017-06-17 NOTE — Telephone Encounter (Signed)
Pt did not show for their appt with Dr. Athar today.  

## 2017-06-18 ENCOUNTER — Encounter: Payer: Self-pay | Admitting: Neurology

## 2017-06-30 ENCOUNTER — Ambulatory Visit (INDEPENDENT_AMBULATORY_CARE_PROVIDER_SITE_OTHER): Payer: 59 | Admitting: Psychology

## 2017-06-30 DIAGNOSIS — F4323 Adjustment disorder with mixed anxiety and depressed mood: Secondary | ICD-10-CM

## 2017-10-27 ENCOUNTER — Telehealth: Payer: Self-pay | Admitting: *Deleted

## 2017-10-27 ENCOUNTER — Telehealth: Payer: Self-pay

## 2017-10-27 NOTE — Telephone Encounter (Signed)
Left a voice message for the patient of his upcoming appointment. Per 3/26 in basket. Schedule by  Md. Requested date and time.

## 2017-10-27 NOTE — Telephone Encounter (Signed)
Patient calling to say he is being treated at [facility] in [place]. rec'd  One chemo treatment of docetaxel and is due for 2nd treatment on April 12th. He will fly home 10/28/17, tomorrow. Would like to continue his treatment here with dr Alen Blew. Patient given my fax # so records may be faxed for dr shadad's review.

## 2017-10-27 NOTE — Telephone Encounter (Signed)
Mailed patient a calender. Per 3/26 in basket message

## 2017-10-27 NOTE — Telephone Encounter (Signed)
Patient returned call for his appointment time and date. Per 3/26 phone que.

## 2017-10-27 NOTE — Telephone Encounter (Signed)
We need records. Message to scheduling sent.

## 2017-10-30 ENCOUNTER — Other Ambulatory Visit: Payer: Self-pay

## 2017-10-30 ENCOUNTER — Inpatient Hospital Stay (HOSPITAL_COMMUNITY)
Admission: EM | Admit: 2017-10-30 | Discharge: 2017-11-02 | DRG: 810 | Disposition: A | Discharge status: Home / Self Care | Payer: 59 | Attending: Internal Medicine | Admitting: Internal Medicine

## 2017-10-30 ENCOUNTER — Encounter (HOSPITAL_COMMUNITY): Payer: Self-pay | Admitting: Obstetrics and Gynecology

## 2017-10-30 ENCOUNTER — Emergency Department (HOSPITAL_COMMUNITY): Payer: 59

## 2017-10-30 DIAGNOSIS — I350 Nonrheumatic aortic (valve) stenosis: Secondary | ICD-10-CM | POA: Diagnosis present

## 2017-10-30 DIAGNOSIS — C61 Malignant neoplasm of prostate: Secondary | ICD-10-CM | POA: Diagnosis present

## 2017-10-30 DIAGNOSIS — E7800 Pure hypercholesterolemia, unspecified: Secondary | ICD-10-CM | POA: Diagnosis present

## 2017-10-30 DIAGNOSIS — I10 Essential (primary) hypertension: Secondary | ICD-10-CM | POA: Diagnosis not present

## 2017-10-30 DIAGNOSIS — R5081 Fever presenting with conditions classified elsewhere: Secondary | ICD-10-CM | POA: Diagnosis not present

## 2017-10-30 DIAGNOSIS — Z809 Family history of malignant neoplasm, unspecified: Secondary | ICD-10-CM

## 2017-10-30 DIAGNOSIS — I700 Atherosclerosis of aorta: Secondary | ICD-10-CM | POA: Diagnosis present

## 2017-10-30 DIAGNOSIS — Z7982 Long term (current) use of aspirin: Secondary | ICD-10-CM

## 2017-10-30 DIAGNOSIS — K5900 Constipation, unspecified: Secondary | ICD-10-CM | POA: Diagnosis not present

## 2017-10-30 DIAGNOSIS — R739 Hyperglycemia, unspecified: Secondary | ICD-10-CM | POA: Diagnosis present

## 2017-10-30 DIAGNOSIS — K1230 Oral mucositis (ulcerative), unspecified: Secondary | ICD-10-CM | POA: Diagnosis present

## 2017-10-30 DIAGNOSIS — R51 Headache: Secondary | ICD-10-CM | POA: Diagnosis not present

## 2017-10-30 DIAGNOSIS — G4733 Obstructive sleep apnea (adult) (pediatric): Secondary | ICD-10-CM | POA: Diagnosis present

## 2017-10-30 DIAGNOSIS — D709 Neutropenia, unspecified: Secondary | ICD-10-CM | POA: Diagnosis not present

## 2017-10-30 DIAGNOSIS — Z7952 Long term (current) use of systemic steroids: Secondary | ICD-10-CM

## 2017-10-30 DIAGNOSIS — Z87891 Personal history of nicotine dependence: Secondary | ICD-10-CM

## 2017-10-30 DIAGNOSIS — F419 Anxiety disorder, unspecified: Secondary | ICD-10-CM | POA: Diagnosis present

## 2017-10-30 DIAGNOSIS — D649 Anemia, unspecified: Secondary | ICD-10-CM | POA: Diagnosis present

## 2017-10-30 DIAGNOSIS — G4700 Insomnia, unspecified: Secondary | ICD-10-CM | POA: Diagnosis present

## 2017-10-30 DIAGNOSIS — M1710 Unilateral primary osteoarthritis, unspecified knee: Secondary | ICD-10-CM | POA: Diagnosis present

## 2017-10-30 DIAGNOSIS — Z885 Allergy status to narcotic agent status: Secondary | ICD-10-CM

## 2017-10-30 DIAGNOSIS — I2510 Atherosclerotic heart disease of native coronary artery without angina pectoris: Secondary | ICD-10-CM | POA: Diagnosis present

## 2017-10-30 LAB — URINALYSIS, ROUTINE W REFLEX MICROSCOPIC
Bilirubin Urine: NEGATIVE
Glucose, UA: NEGATIVE mg/dL
Hgb urine dipstick: NEGATIVE
Ketones, ur: NEGATIVE mg/dL
Leukocytes, UA: NEGATIVE
Nitrite: NEGATIVE
Protein, ur: NEGATIVE mg/dL
Specific Gravity, Urine: 1.015 (ref 1.005–1.030)
pH: 5 (ref 5.0–8.0)

## 2017-10-30 LAB — CBC WITH DIFFERENTIAL/PLATELET
Band Neutrophils: 1 %
Basophils Absolute: 0 10*3/uL (ref 0.0–0.1)
Basophils Relative: 0 %
Blasts: 0 %
Eosinophils Absolute: 0 10*3/uL (ref 0.0–0.7)
Eosinophils Relative: 0 %
HCT: 38.9 % — ABNORMAL LOW (ref 39.0–52.0)
Hemoglobin: 13.3 g/dL (ref 13.0–17.0)
Lymphocytes Relative: 53 %
Lymphs Abs: 0.8 10*3/uL (ref 0.7–4.0)
MCH: 32 pg (ref 26.0–34.0)
MCHC: 34.2 g/dL (ref 30.0–36.0)
MCV: 93.5 fL (ref 78.0–100.0)
Metamyelocytes Relative: 0 %
Monocytes Absolute: 0.4 10*3/uL (ref 0.1–1.0)
Monocytes Relative: 31 %
Myelocytes: 0 %
Neutro Abs: 0.2 10*3/uL — ABNORMAL LOW (ref 1.7–7.7)
Neutrophils Relative %: 15 %
Other: 0 %
Platelets: 293 10*3/uL (ref 150–400)
Promyelocytes Absolute: 0 %
RBC: 4.16 MIL/uL — ABNORMAL LOW (ref 4.22–5.81)
RDW: 12.5 % (ref 11.5–15.5)
WBC: 1.4 10*3/uL — CL (ref 4.0–10.5)
nRBC: 0 /100 WBC

## 2017-10-30 LAB — COMPREHENSIVE METABOLIC PANEL
ALT: 40 U/L (ref 17–63)
AST: 23 U/L (ref 15–41)
Albumin: 3.8 g/dL (ref 3.5–5.0)
Alkaline Phosphatase: 84 U/L (ref 38–126)
Anion gap: 11 (ref 5–15)
BUN: 21 mg/dL — ABNORMAL HIGH (ref 6–20)
CO2: 26 mmol/L (ref 22–32)
Calcium: 9 mg/dL (ref 8.9–10.3)
Chloride: 99 mmol/L — ABNORMAL LOW (ref 101–111)
Creatinine, Ser: 0.83 mg/dL (ref 0.61–1.24)
GFR calc Af Amer: >60 mL/min (ref >60)
GFR calc non Af Amer: >60 mL/min (ref >60)
Glucose, Bld: 113 mg/dL — ABNORMAL HIGH (ref 65–99)
Potassium: 4.1 mmol/L (ref 3.5–5.1)
Sodium: 136 mmol/L (ref 135–145)
Total Bilirubin: 0.9 mg/dL (ref 0.3–1.2)
Total Protein: 7.1 g/dL (ref 6.5–8.1)

## 2017-10-30 LAB — PROTIME-INR
INR: 0.98
Prothrombin Time: 12.9 seconds (ref 11.4–15.2)

## 2017-10-30 LAB — I-STAT CG4 LACTIC ACID, ED: Lactic Acid, Venous: 1.48 mmol/L (ref 0.5–1.9)

## 2017-10-30 MED ORDER — SODIUM CHLORIDE 0.9 % IV SOLN
2.0000 g | Freq: Three times a day (TID) | INTRAVENOUS | Status: DC
  Administered 2017-10-30: 2 g via INTRAVENOUS
  Filled 2017-10-30: qty 2

## 2017-10-30 MED ORDER — ONDANSETRON HCL 4 MG PO TABS: 8.0000 mg | ORAL_TABLET | Freq: Three times a day (TID) | ORAL | Status: DC | PRN

## 2017-10-30 MED ORDER — EZETIMIBE 10 MG PO TABS
10.0000 mg | ORAL_TABLET | Freq: Every day | ORAL | Status: DC
  Administered 2017-10-31 – 2017-11-02 (×3): 10 mg via ORAL
  Filled 2017-10-30 (×3): qty 1

## 2017-10-30 MED ORDER — SODIUM CHLORIDE 0.9 % IV BOLUS
1000.0000 mL | Freq: Once | INTRAVENOUS | Status: AC
  Administered 2017-10-30: 1000 mL via INTRAVENOUS

## 2017-10-30 MED ORDER — SODIUM CHLORIDE 0.9 % IV SOLN: 250.0000 mL | INTRAVENOUS | Status: DC | PRN

## 2017-10-30 MED ORDER — ALPRAZOLAM 0.5 MG PO TABS
0.5000 mg | ORAL_TABLET | Freq: Every evening | ORAL | Status: DC | PRN
  Administered 2017-10-31 – 2017-11-01 (×3): 0.5 mg via ORAL
  Filled 2017-10-30 (×3): qty 1

## 2017-10-30 MED ORDER — SODIUM CHLORIDE 0.9% FLUSH
3.0000 mL | Freq: Two times a day (BID) | INTRAVENOUS | Status: DC
  Administered 2017-10-30 – 2017-11-02 (×4): 3 mL via INTRAVENOUS

## 2017-10-30 MED ORDER — ADULT MULTIVITAMIN W/MINERALS CH
1.0000 | ORAL_TABLET | Freq: Every day | ORAL | Status: DC
  Administered 2017-10-31 – 2017-11-02 (×3): 1 via ORAL
  Filled 2017-10-30 (×3): qty 1

## 2017-10-30 MED ORDER — SODIUM CHLORIDE 0.9 % IV SOLN
500.0000 mg | Freq: Three times a day (TID) | INTRAVENOUS | Status: DC
  Administered 2017-10-31: 500 mg via INTRAVENOUS
  Filled 2017-10-30 (×2): qty 500

## 2017-10-30 MED ORDER — PREDNISONE 5 MG PO TABS
5.0000 mg | ORAL_TABLET | Freq: Two times a day (BID) | ORAL | Status: DC
  Administered 2017-10-31 – 2017-11-02 (×5): 5 mg via ORAL
  Filled 2017-10-30 (×6): qty 1

## 2017-10-30 MED ORDER — ONDANSETRON HCL 4 MG PO TABS: 4.0000 mg | ORAL_TABLET | Freq: Four times a day (QID) | ORAL | Status: DC | PRN

## 2017-10-30 MED ORDER — AMLODIPINE BESYLATE 10 MG PO TABS
10.0000 mg | ORAL_TABLET | Freq: Every day | ORAL | Status: DC
  Administered 2017-10-31 – 2017-11-02 (×3): 10 mg via ORAL
  Filled 2017-10-30 (×3): qty 1

## 2017-10-30 MED ORDER — NEBIVOLOL HCL 10 MG PO TABS
10.0000 mg | ORAL_TABLET | Freq: Every evening | ORAL | Status: DC
  Administered 2017-10-31 – 2017-11-01 (×3): 10 mg via ORAL
  Filled 2017-10-30 (×3): qty 1

## 2017-10-30 MED ORDER — ONDANSETRON HCL 4 MG/2ML IJ SOLN: 4.0000 mg | Freq: Four times a day (QID) | INTRAMUSCULAR | Status: DC | PRN

## 2017-10-30 MED ORDER — TAMSULOSIN HCL 0.4 MG PO CAPS
0.4000 mg | ORAL_CAPSULE | Freq: Two times a day (BID) | ORAL | Status: DC
  Administered 2017-10-31 – 2017-11-02 (×6): 0.4 mg via ORAL
  Filled 2017-10-30 (×6): qty 1

## 2017-10-30 MED ORDER — SODIUM CHLORIDE 0.9% FLUSH: 3.0000 mL | INTRAVENOUS | Status: DC | PRN

## 2017-10-30 MED ORDER — ASPIRIN EC 325 MG PO TBEC
325.0000 mg | DELAYED_RELEASE_TABLET | Freq: Every day | ORAL | Status: DC
  Administered 2017-10-31 – 2017-11-02 (×3): 325 mg via ORAL
  Filled 2017-10-30 (×3): qty 1

## 2017-10-30 NOTE — ED Notes (Signed)
ED TO INPATIENT HANDOFF REPORT  Name/Age/Gender Alejandro [name]. 62 y.o. male  Code Status    Code Status Orders  (From admission, onward)        Start     Ordered   10/30/17 2255  Full code  Continuous     10/30/17 2256    Code Status History    This patient has a current code status but no historical code status.      Home/SNF/Other Home  Chief Complaint Ca pt, Fever 101.8  Level of Care/Admitting Diagnosis ED Disposition    ED Disposition Condition [facility] Area: [place] [[id number]]  Level of Care: Med-Surg [16]  Diagnosis: Neutropenic fever ([place]) [203559]  Admitting Physician: Phillips Grout [[id number]]  Attending Physician: Derrill Kay A [[id number]]  PT Class (Do Not Modify): Observation [104]  PT Acc Code (Do Not Modify): Observation [10022]       Medical History Past Medical History:  Diagnosis Date  . ANXIETY   . AORTIC STENOSIS, MILD   . CAD   . CHEST PAIN-UNSPECIFIED   . DYSPNEA   . HYPERCHOLESTEROLEMIA   . Hypertension   . INSOMNIA   . OSA on CPAP 05/31/2013  . OSTEOARTHRITIS, KNEE   . Prostate cancer (HCC)     Allergies Allergies  Allergen Reactions  . Codeine     REACTION: Nausea    IV Location/Drains/Wounds Patient Lines/Drains/Airways Status   Active Line/Drains/Airways    Name:   Placement date:   Placement time:   Site:   Days:   Peripheral IV 10/30/17 Right Antecubital   10/30/17    2014    Antecubital   less than 1          Labs/Imaging Results for orders placed or performed during the hospital encounter of 10/30/17 (from the past 48 hour(s))  Comprehensive metabolic panel     Status: Abnormal   Collection Time: 10/30/17  8:10 PM  Result Value Ref Range   Sodium 136 135 - 145 mmol/L   Potassium 4.1 3.5 - 5.1 mmol/L   Chloride 99 (L) 101 - 111 mmol/L   CO2 26 22 - 32 mmol/L   Glucose, Bld 113 (H) 65 - 99 mg/dL   BUN 21 (H) 6 - 20 mg/dL   Creatinine, Ser 0.83 0.61 - 1.24  mg/dL   Calcium 9.0 8.9 - 10.3 mg/dL   Total Protein 7.1 6.5 - 8.1 g/dL   Albumin 3.8 3.5 - 5.0 g/dL   AST 23 15 - 41 U/L   ALT 40 17 - 63 U/L   Alkaline Phosphatase 84 38 - 126 U/L   Total Bilirubin 0.9 0.3 - 1.2 mg/dL   GFR calc non Af Amer >60 >60 mL/min   GFR calc Af Amer >60 >60 mL/min    Comment: (NOTE) The eGFR has been calculated using the CKD EPI equation. This calculation has not been validated in all clinical situations. eGFR's persistently <60 mL/min signify possible Chronic Kidney Disease.    Anion gap 11 5 - 15    Comment: Performed at [facility], [place] [address], [place], [place] [address]  CBC with Differential     Status: Abnormal   Collection Time: 10/30/17  8:10 PM  Result Value Ref Range   WBC 1.4 (LL) 4.0 - 10.5 K/uL    Comment: CRITICAL RESULT CALLED TO, READ BACK BY AND VERIFIED WITH: RAND,K. RN _0  ON 03.29.19 BY COHEN,K    RBC  4.16 (L) 4.22 - 5.81 MIL/uL   Hemoglobin 13.3 13.0 - 17.0 g/dL   HCT 38.9 (L) 39.0 - 52.0 %   MCV 93.5 78.0 - 100.0 fL   MCH 32.0 26.0 - 34.0 pg   MCHC 34.2 30.0 - 36.0 g/dL   RDW 12.5 11.5 - 15.5 %   Platelets 293 150 - 400 K/uL   Neutrophils Relative % 15 %   Lymphocytes Relative 53 %   Monocytes Relative 31 %   Eosinophils Relative 0 %   Basophils Relative 0 %   Band Neutrophils 1 %   Metamyelocytes Relative 0 %   Myelocytes 0 %   Promyelocytes Absolute 0 %   Blasts 0 %   nRBC 0 0 /100 WBC   Other 0 %   Neutro Abs 0.2 (L) 1.7 - 7.7 K/uL   Lymphs Abs 0.8 0.7 - 4.0 K/uL   Monocytes Absolute 0.4 0.1 - 1.0 K/uL   Eosinophils Absolute 0.0 0.0 - 0.7 K/uL   Basophils Absolute 0.0 0.0 - 0.1 K/uL   Smear Review MORPHOLOGY UNREMARKABLE     Comment: Performed at [facility], [place] [address], [place], [place] [address]  Protime-INR     Status: None   Collection Time: 10/30/17  8:10 PM  Result Value Ref Range   Prothrombin Time 12.9 11.4 - 15.2 seconds   INR 0.98     Comment:  Performed at [facility], [place] [address], [place], [place] [address]  I-Stat CG4 Lactic Acid, ED     Status: None   Collection Time: 10/30/17  8:27 PM  Result Value Ref Range   Lactic Acid, Venous 1.48 0.5 - 1.9 mmol/L  Urinalysis, Routine w reflex microscopic     Status: None   Collection Time: 10/30/17  9:13 PM  Result Value Ref Range   Color, Urine YELLOW YELLOW   APPearance CLEAR CLEAR   Specific Gravity, Urine 1.015 1.005 - 1.030   pH 5.0 5.0 - 8.0   Glucose, UA NEGATIVE NEGATIVE mg/dL   Hgb urine dipstick NEGATIVE NEGATIVE   Bilirubin Urine NEGATIVE NEGATIVE   Ketones, ur NEGATIVE NEGATIVE mg/dL   Protein, ur NEGATIVE NEGATIVE mg/dL   Nitrite NEGATIVE NEGATIVE   Leukocytes, UA NEGATIVE NEGATIVE    Comment: Performed at [place] [address] [place], [place] [address]   Dg Chest 2 View  Result Date: 10/30/2017 CLINICAL DATA:  Fever. Ongoing chemotherapy for metastatic prostate cancer. EXAM: CHEST - 2 VIEW COMPARISON:  10/07/2007 FINDINGS: The cardiomediastinal silhouette is within normal limits. Aortic atherosclerosis is noted. The lungs are mildly hyperinflated without evidence of airspace consolidation, edema, pleural effusion, pneumothorax. Old left rib fractures are noted. IMPRESSION: No active cardiopulmonary disease. Electronically Signed   By: Logan Bores M.D.   On: 10/30/2017 20:31    Pending Labs Unresulted Labs (From admission, onward)   Start     Ordered   10/31/17 9774  Basic metabolic panel  Tomorrow morning,   R     10/30/17 2256   10/31/17 0500  CBC  Tomorrow morning,   R     10/30/17 2256   10/30/17 2048  Urine Culture  Once,   STAT     10/30/17 2047   10/30/17 1944  Culture, blood (Routine x 2)  BLOOD CULTURE X 2,   STAT     10/30/17 1943      Vitals/Pain Today's Vitals   10/30/17 1938 10/30/17 2026 10/30/17 2135 10/30/17  2136  BP:  120/69 (!) 147/89   Pulse:  60 60   Resp:  18 18   Temp:  99.6 F  (37.6 C) 99.7 F (37.6 C)   TempSrc:  Oral Oral   SpO2:  96% 99%   Weight: 250 lb (113.4 kg)   250 lb (113.4 kg)  Height: 5' 10" (1.778 m)   5' 10" (1.778 m)  PainSc: 0-No pain 0-No pain 0-No pain     Isolation Precautions No active isolations  Medications Medications  ezetimibe (ZETIA) tablet 10 mg (has no administration in time range)  amLODipine (NORVASC) tablet 10 mg (has no administration in time range)  tamsulosin (FLOMAX) capsule 0.4 mg (has no administration in time range)  multivitamin tablet 1 tablet (has no administration in time range)  nebivolol (BYSTOLIC) tablet 10 mg (has no administration in time range)  ALPRAZolam (XANAX) tablet 0.5 mg (has no administration in time range)  ondansetron (ZOFRAN) tablet 8 mg (has no administration in time range)  predniSONE (DELTASONE) tablet 5 mg (has no administration in time range)  aspirin EC tablet 325 mg (has no administration in time range)  sodium chloride flush (NS) 0.9 % injection 3 mL (3 mLs Intravenous Given 10/30/17 2305)  sodium chloride flush (NS) 0.9 % injection 3 mL (has no administration in time range)  0.9 %  sodium chloride infusion (has no administration in time range)  ondansetron (ZOFRAN) tablet 4 mg (has no administration in time range)    Or  ondansetron (ZOFRAN) injection 4 mg (has no administration in time range)  sodium chloride 0.9 % bolus 1,000 mL (0 mLs Intravenous Stopped 10/30/17 2135)    Mobility walks

## 2017-10-30 NOTE — ED Notes (Signed)
Bed: ZY24 Expected date:  Expected time:  Means of arrival:  Comments: Triage cancer pt

## 2017-10-30 NOTE — H&P (Signed)
History and Physical    Lundy Aetna. NKN:[id number] DOB: 22-May-1956 DOA: 10/30/2017  PCP: [name], MD  Patient coming from: Home  Chief Complaint: Fever  HPI: Alejandro [name]. is a 61 y.o. male with medical history significant of recently diagnosed prostate cancer started chemotherapy 1 week ago comes in with fever at home.  Patient denies any nausea vomiting or diarrhea.  He denies any cough or shortness of breath.  He denies any abdominal pain.  He denies any rashes.  He denies any flulike symptoms.  He feels perfectly fine.  He was instructed to come the emergency department with any fever.  Patient noted to be neutropenic today and referred for admission for neutropenic fever.  Oncology has been called who will see patient in the morning and have recommended imipenem.  Review of Systems: As per HPI otherwise 10 point review of systems negative.   Past Medical History:  Diagnosis Date  . ANXIETY   . AORTIC STENOSIS, MILD   . CAD   . CHEST PAIN-UNSPECIFIED   . DYSPNEA   . HYPERCHOLESTEROLEMIA   . Hypertension   . INSOMNIA   . OSA on CPAP 05/31/2013  . OSTEOARTHRITIS, KNEE   . Prostate cancer [facility])     Past Surgical History:  Procedure Laterality Date  . COLONOSCOPY    . ELBOW SURGERY    . LUMBAR LAMINECTOMY  Late 1990's  . NEUROPLASTY / TRANSPOSITION MEDIAN NERVE AT CARPAL TUNNEL BILATERAL    . PROSTATE BIOPSY       reports that he has quit smoking. His smoking use included cigars and cigarettes. He has a 75.00 pack-year smoking history. He has never used smokeless tobacco. He reports that he drinks about 6.0 oz of alcohol per week. He reports that he does not use drugs.  Allergies  Allergen Reactions  . Codeine     REACTION: Nausea    Family History  Problem Relation Age of Onset  . Cancer Father        prostate    Prior to Admission medications   Medication Sig Start Date End Date Taking? Authorizing Provider  ALPRAZolam Duanne Moron) 0.5  MG tablet Take 0.5 mg by mouth at bedtime as needed for sleep.  09/13/17  Yes [provider]  amLODipine (NORVASC) 10 MG tablet Take 10 mg by mouth daily.   Yes [provider]  aspirin 325 MG EC tablet Take 325 mg by mouth daily.   Yes [provider]  BENICAR 40 MG tablet Take 1 tablet by mouth daily. 09/22/13  Yes [provider]  BYSTOLIC 10 MG tablet Take 10 mg by mouth every evening.  06/06/14  Yes [provider]  ezetimibe (ZETIA) 10 MG tablet Take 10 mg by mouth daily.   Yes [provider]  Multiple Vitamin (MULTIVITAMIN) tablet Take 1 tablet by mouth daily.   Yes [provider]  ondansetron (ZOFRAN) 8 MG tablet Take 8 mg by mouth every 8 (eight) hours as needed for nausea or vomiting.  10/19/17  Yes [provider]  predniSONE (DELTASONE) 5 MG tablet Take 5 mg by mouth 2 (two) times daily with a meal.  09/17/17  Yes [provider]  tamsulosin (FLOMAX) 0.4 MG CAPS capsule Take 0.4 mg by mouth 2 (two) times daily.  03/17/14  Yes [provider]  Alirocumab (PRALUENT [place]) Inject 75 mg into the skin every 14 (fourteen) days.     [provider]  dexamethasone (DECADRON) 4 MG tablet  Take 8 mg by mouth 2 (two) times daily. Take 2 tablets BID for 3 Days before chemo, the day of chemo and the day after chemo 10/16/17   [provider]  VIAGRA 100 MG tablet Take 1 tablet by mouth as needed for erectile dysfunction.  10/08/13   [provider]    Physical Exam: Vitals:   10/30/17 1938 10/30/17 2026 10/30/17 2135 10/30/17 2136  BP:  120/69 (!) 147/89   Pulse:  60 60   Resp:  18 18   Temp:  99.6 F (37.6 C) 99.7 F (37.6 C)   TempSrc:  Oral Oral   SpO2:  96% 99%   Weight: 113.4 kg (250 lb)   113.4 kg (250 lb)  Height: 5\' 10"  (1.778 m)   5\' 10"  (1.778 m)      Constitutional: NAD, calm, comfortable Vitals:   10/30/17 1938 10/30/17 2026 10/30/17 2135 10/30/17 2136  BP:  120/69  (!) 147/89   Pulse:  60 60   Resp:  18 18   Temp:  99.6 F (37.6 C) 99.7 F (37.6 C)   TempSrc:  Oral Oral   SpO2:  96% 99%   Weight: 113.4 kg (250 lb)   113.4 kg (250 lb)  Height: 5\' 10"  (1.778 m)   5\' 10"  (1.778 m)   Eyes: PERRL, lids and conjunctivae normal ENMT: Mucous membranes are moist. Posterior pharynx clear of any exudate or lesions.Normal dentition.  Neck: normal, supple, no masses, no thyromegaly Respiratory: clear to auscultation bilaterally, no wheezing, no crackles. Normal respiratory effort. No accessory muscle use.  Cardiovascular: Regular rate and rhythm, no murmurs / rubs / gallops. No extremity edema. 2+ pedal pulses. No carotid bruits.  Abdomen: no tenderness, no masses palpated. No hepatosplenomegaly. Bowel sounds positive.  Musculoskeletal: no clubbing / cyanosis. No joint deformity upper and lower extremities. Good ROM, no contractures. Normal muscle tone.  Skin: no rashes, lesions, ulcers. No induration Neurologic: CN 2-12 grossly intact. Sensation intact, DTR normal. Strength 5/5 in all 4.  Psychiatric: Normal judgment and insight. Alert and oriented x 3. Normal mood.    Labs on Admission: I have personally reviewed following labs and imaging studies  CBC: Recent Labs  Lab 10/30/17 2010  WBC 1.4*  NEUTROABS 0.2*  HGB 13.3  HCT 38.9*  MCV 93.5  PLT 161   Basic Metabolic Panel: Recent Labs  Lab 10/30/17 2010  NA 136  K 4.1  CL 99*  CO2 26  GLUCOSE 113*  BUN 21*  CREATININE 0.83  CALCIUM 9.0   GFR: Estimated Creatinine Clearance: 119.4 mL/min (by C-G formula based on SCr of 0.83 mg/dL). Liver Function Tests: Recent Labs  Lab 10/30/17 2010  AST 23  ALT 40  ALKPHOS 84  BILITOT 0.9  PROT 7.1  ALBUMIN 3.8   No results for input(s): LIPASE, AMYLASE in the last 168 hours. No results for input(s): AMMONIA in the last 168 hours. Coagulation Profile: Recent Labs  Lab 10/30/17 2010  INR 0.98   Cardiac Enzymes: No results for  input(s): CKTOTAL, CKMB, CKMBINDEX, TROPONINI in the last 168 hours. BNP (last 3 results) No results for input(s): PROBNP in the last 8760 hours. HbA1C: No results for input(s): HGBA1C in the last 72 hours. CBG: No results for input(s): GLUCAP in the last 168 hours. Lipid Profile: No results for input(s): CHOL, HDL, LDLCALC, TRIG, CHOLHDL, LDLDIRECT in the last 72 hours. Thyroid Function Tests: No results for input(s): TSH, T4TOTAL, FREET4, T3FREE, THYROIDAB in the last  72 hours. Anemia Panel: No results for input(s): VITAMINB12, FOLATE, FERRITIN, TIBC, IRON, RETICCTPCT in the last 72 hours. Urine analysis:    Component Value Date/Time   COLORURINE YELLOW 10/30/2017 2113   APPEARANCEUR CLEAR 10/30/2017 2113   LABSPEC 1.015 10/30/2017 2113   PHURINE 5.0 10/30/2017 2113   GLUCOSEU NEGATIVE 10/30/2017 2113   HGBUR NEGATIVE 10/30/2017 2113   BILIRUBINUR NEGATIVE 10/30/2017 2113   [place] NEGATIVE 10/30/2017 2113   PROTEINUR NEGATIVE 10/30/2017 2113   NITRITE NEGATIVE 10/30/2017 2113   LEUKOCYTESUR NEGATIVE 10/30/2017 2113   Sepsis Labs: !!!!!!!!!!!!!!!!!!!!!!!!!!!!!!!!!!!!!!!!!!!! @LABRCNTIP (procalcitonin:4,lacticidven:4) )No results found for this or any previous visit (from the past 240 hour(s)).   Radiological Exams on Admission: Dg Chest 2 View  Result Date: 10/30/2017 CLINICAL DATA:  Fever. Ongoing chemotherapy for metastatic prostate cancer. EXAM: CHEST - 2 VIEW COMPARISON:  10/07/2007 FINDINGS: The cardiomediastinal silhouette is within normal limits. Aortic atherosclerosis is noted. The lungs are mildly hyperinflated without evidence of airspace consolidation, edema, pleural effusion, pneumothorax. Old left rib fractures are noted. IMPRESSION: No active cardiopulmonary disease. Electronically Signed   By: Logan Bores M.D.   On: 10/30/2017 20:31    Old chart reviewed Case discussed with EDP  Assessment/Plan 62 year old male with prostate cancer on chemotherapy comes in  with fever found to be neutropenic Principal Problem:   Neutropenic fever (HCC)-obtain cultures.  Chest x-ray negative.  Urinalysis is negative.  Review of systems grossly negative.  Cover with IV imipenem.  Oncology to see in the morning.  Review of systems grossly negative  Active Problems:   Essential hypertension-stable continue home meds   Clinical stage T1c N1 M0 adenocarcinoma of the prostate with a Gleason's score of 4+4 and a PSA of 6.4-noted     DVT prophylaxis: SCDs Code Status: Full Family Communication: None Disposition Plan: Per day team Consults called: Oncology Admission status: Observation   DAVID,RACHAL A MD [facility]  If 7PM-7AM, please contact night-coverage [email or web address] Password [facility]  10/30/2017, 10:52 PM

## 2017-10-30 NOTE — ED Triage Notes (Signed)
Per Pt: Pt has had 1 chemo treatment on the 21st on March. Pt reports is has been 8 days since. Pt reports a fever of 101.8 at home.  Pt reports a mild headache for about a week. No nausea, no vomiting.  Pt reports sores in his mouth and has not used any magic mouthwash at this time. Pt reports gargling with baking soda and water

## 2017-10-30 NOTE — ED Notes (Signed)
Attempted to call report-no answer 

## 2017-10-30 NOTE — ED Provider Notes (Signed)
[facility] DEPT Provider Note   CSN: [id number] Arrival date & time: 10/30/17  1849     History   Chief Complaint Chief Complaint  Patient presents with  . Fever  . cancer patient    HPI Alejandro [name]. is a 62 y.o. male.  HPI The patient presents to the emergency room for evaluation of fever associated with recent chemotherapy.  Patient is getting treated for metastatic prostate cancer.  He had his first treatment 8 days ago.  He was given docetaxel.  Patient developed a fever today up to 101.8.  Denies any trouble with nausea vomiting.  No cough or dysuria.  No diarrhea.  He has noticed some sores in his mouth.  He was instructed to come to the urgency room if he had not any fever while he was on his chemotherapy.  Patient has been getting treated at [facility] but will be getting his additional treatments and has an appointment with the [facility] cancer center.   Past Medical History:  Diagnosis Date  . ANXIETY   . AORTIC STENOSIS, MILD   . CAD   . CHEST PAIN-UNSPECIFIED   . DYSPNEA   . HYPERCHOLESTEROLEMIA   . Hypertension   . INSOMNIA   . OSA on CPAP 05/31/2013  . OSTEOARTHRITIS, KNEE   . Prostate cancer [facility])     Patient Active Problem List   Diagnosis Date Noted  . 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 OSA on CPAP 05/31/2013  . DYSPNEA 01/23/2010  . CHEST PAIN-UNSPECIFIED 01/23/2010  . ANXIETY 12/10/2009  . OSTEOARTHRITIS, KNEE 12/10/2009  . INSOMNIA 12/10/2009  . HYPERCHOLESTEROLEMIA 12/07/2009  . HYPERTENSION 12/07/2009  . CAD 12/07/2009  . AORTIC STENOSIS, MILD 12/07/2009    Past Surgical History:  Procedure Laterality Date  . COLONOSCOPY    . ELBOW SURGERY    . LUMBAR LAMINECTOMY  Late 1990's  . NEUROPLASTY / TRANSPOSITION MEDIAN NERVE AT CARPAL TUNNEL BILATERAL    . PROSTATE BIOPSY          Home Medications     Prior to Admission medications   Medication Sig Start Date End Date Taking? Authorizing Provider  ALPRAZolam Duanne Moron) 0.5 MG tablet Take 0.5 mg by mouth at bedtime as needed for sleep.  09/13/17  Yes [provider]  amLODipine (NORVASC) 10 MG tablet Take 10 mg by mouth daily.   Yes [provider]  aspirin 325 MG EC tablet Take 325 mg by mouth daily.   Yes [provider]  BENICAR 40 MG tablet Take 1 tablet by mouth daily. 09/22/13  Yes [provider]  BYSTOLIC 10 MG tablet Take 10 mg by mouth every evening.  06/06/14  Yes [provider]  ezetimibe (ZETIA) 10 MG tablet Take 10 mg by mouth daily.   Yes [provider]  Multiple Vitamin (MULTIVITAMIN) tablet Take 1 tablet by mouth daily.   Yes [provider]  ondansetron (ZOFRAN) 8 MG tablet Take 8 mg by mouth every 8 (eight) hours as needed for nausea or vomiting.  10/19/17  Yes [provider]  predniSONE (DELTASONE) 5 MG tablet Take 5 mg by mouth 2 (two) times daily with a meal.  09/17/17  Yes [provider]  tamsulosin (FLOMAX) 0.4 MG CAPS capsule Take 0.4 mg by mouth 2 (two) times daily.  03/17/14  Yes [provider]  Alirocumab (PRALUENT [place]) Inject  75 mg into the skin every 14 (fourteen) days.     [provider]  dexamethasone (DECADRON) 4 MG tablet Take 8 mg by mouth 2 (two) times daily. Take 2 tablets BID for 3 Days before chemo, the day of chemo and the day after chemo 10/16/17   [provider]  VIAGRA 100 MG tablet Take 1 tablet by mouth as needed for erectile dysfunction.  10/08/13   [provider]    Family History Family History  Problem Relation Age of Onset  . Cancer Father        prostate    Social History Social History   Tobacco Use  . Smoking status: Former Smoker    Packs/day: 3.00    Years: 25.00    Pack years: 75.00    Types: Cigars, Cigarettes  . Smokeless tobacco: Never Used  . Tobacco  comment: Former 3 ppd smoker for 30 years quit heavy smoking 15 years ago  Substance Use Topics  . Alcohol use: Yes    Alcohol/week: 6.0 oz    Types: 12 Standard drinks or equivalent per week    Comment: 2 drinks per day  . Drug use: No     Allergies   Codeine   Review of Systems Review of Systems  All other systems reviewed and are negative.    Physical Exam Updated Vital Signs BP (!) 147/89 (BP Location: Left Arm)   Pulse 60   Temp 99.7 F (37.6 C) (Oral)   Resp 18   Ht 1.778 m (5\' 10" )   Wt 113.4 kg (250 lb)   SpO2 99%   BMI 35.87 kg/m   Physical Exam  Constitutional: He appears well-developed and well-nourished. No distress.  HENT:  Head: Normocephalic and atraumatic.  Right Ear: External ear normal.  Left Ear: External ear normal.  Few small erythematous vesicular lesions on the tongue  Eyes: Conjunctivae are normal. Right eye exhibits no discharge. Left eye exhibits no discharge. No scleral icterus.  Neck: Neck supple. No tracheal deviation present.  Cardiovascular: Normal rate, regular rhythm and intact distal pulses.  Pulmonary/Chest: Effort normal and breath sounds normal. No stridor. No respiratory distress. He has no wheezes. He has no rales.  Abdominal: Soft. Bowel sounds are normal. He exhibits no distension. There is no tenderness. There is no rebound and no guarding.  Musculoskeletal: He exhibits no edema or tenderness.  Neurological: He is alert. He has normal strength. No cranial nerve deficit (no facial droop, extraocular movements intact, no slurred speech) or sensory deficit. He exhibits normal muscle tone. He displays no seizure activity. Coordination normal.  Skin: Skin is warm and dry. No rash noted.  Psychiatric: He has a normal mood and affect.  Nursing note and vitals reviewed.    ED Treatments / Results  Labs (all labs ordered are listed, but only abnormal results are displayed) Labs Reviewed  COMPREHENSIVE METABOLIC PANEL -  Abnormal; Notable for the following components:      Result Value   Chloride 99 (*)    Glucose, Bld 113 (*)    BUN 21 (*)    All other components within normal limits  CBC WITH DIFFERENTIAL/PLATELET - Abnormal; Notable for the following components:   WBC 1.4 (*)    RBC 4.16 (*)    HCT 38.9 (*)    Neutro Abs 0.2 (*)    All other components within normal limits  CULTURE, BLOOD (ROUTINE X 2)  CULTURE, BLOOD (ROUTINE X 2)  URINE CULTURE  PROTIME-INR  URINALYSIS, ROUTINE W REFLEX MICROSCOPIC  I-STAT CG4 LACTIC ACID, ED  I-STAT CG4 LACTIC ACID, ED     Radiology Dg Chest 2 View  Result Date: 10/30/2017 CLINICAL DATA:  Fever. Ongoing chemotherapy for metastatic prostate cancer. EXAM: CHEST - 2 VIEW COMPARISON:  10/07/2007 FINDINGS: The cardiomediastinal silhouette is within normal limits. Aortic atherosclerosis is noted. The lungs are mildly hyperinflated without evidence of airspace consolidation, edema, pleural effusion, pneumothorax. Old left rib fractures are noted. IMPRESSION: No active cardiopulmonary disease. Electronically Signed   By: Logan Bores M.D.   On: 10/30/2017 20:31    Procedures Procedures (including critical care time)  Medications Ordered in ED Medications  ceFEPIme (MAXIPIME) 2 g in sodium chloride 0.9 % 100 mL IVPB (has no administration in time range)  sodium chloride 0.9 % bolus 1,000 mL (0 mLs Intravenous Stopped 10/30/17 2135)     Initial Impression / Assessment and Plan / ED Course  I have reviewed the triage vital signs and the nursing notes.  Pertinent labs & imaging results that were available during my care of the patient were reviewed by me and considered in my medical decision making (see chart for details).  Clinical Course as of Oct 31 2230  Fri Oct 30, 2017  2220 Labs notable for low WBC of 1.4.  Neut percentage is 0.2.  ANC is 0.28   [JK]  2223 WBC(!!): 1.4 [JK]  2232 I discussed the case with Dr Annamaria Boots.  She recommends admission.  She will  consult on the patient   [JK]    Clinical Course User Index [JK] Dorie Rank, MD    Patient presented to the emergency room for evaluation of fever associated with his chemotherapy.  Patient's laboratory tests are notable for neutropenia.  He is nontoxic and does not appear septic.  I have ordered antibiotics.  Cultures have been sent.  I will consult oncology.  Final Clinical Impressions(s) / ED Diagnoses   Final diagnoses:  Neutropenic fever (HCC)      Dorie Rank, MD 10/30/17 2233

## 2017-10-31 ENCOUNTER — Encounter (HOSPITAL_COMMUNITY): Payer: Self-pay

## 2017-10-31 ENCOUNTER — Other Ambulatory Visit: Payer: Self-pay

## 2017-10-31 DIAGNOSIS — I2510 Atherosclerotic heart disease of native coronary artery without angina pectoris: Secondary | ICD-10-CM

## 2017-10-31 DIAGNOSIS — K1379 Other lesions of oral mucosa: Secondary | ICD-10-CM

## 2017-10-31 DIAGNOSIS — M1710 Unilateral primary osteoarthritis, unspecified knee: Secondary | ICD-10-CM | POA: Diagnosis present

## 2017-10-31 DIAGNOSIS — R5081 Fever presenting with conditions classified elsewhere: Secondary | ICD-10-CM | POA: Diagnosis present

## 2017-10-31 DIAGNOSIS — R739 Hyperglycemia, unspecified: Secondary | ICD-10-CM | POA: Diagnosis present

## 2017-10-31 DIAGNOSIS — M1990 Unspecified osteoarthritis, unspecified site: Secondary | ICD-10-CM | POA: Diagnosis not present

## 2017-10-31 DIAGNOSIS — Z809 Family history of malignant neoplasm, unspecified: Secondary | ICD-10-CM | POA: Diagnosis not present

## 2017-10-31 DIAGNOSIS — Z7982 Long term (current) use of aspirin: Secondary | ICD-10-CM | POA: Diagnosis not present

## 2017-10-31 DIAGNOSIS — Z885 Allergy status to narcotic agent status: Secondary | ICD-10-CM | POA: Diagnosis not present

## 2017-10-31 DIAGNOSIS — D709 Neutropenia, unspecified: Principal | ICD-10-CM

## 2017-10-31 DIAGNOSIS — Z87891 Personal history of nicotine dependence: Secondary | ICD-10-CM | POA: Diagnosis not present

## 2017-10-31 DIAGNOSIS — K1230 Oral mucositis (ulcerative), unspecified: Secondary | ICD-10-CM | POA: Diagnosis present

## 2017-10-31 DIAGNOSIS — F419 Anxiety disorder, unspecified: Secondary | ICD-10-CM | POA: Diagnosis present

## 2017-10-31 DIAGNOSIS — E7800 Pure hypercholesterolemia, unspecified: Secondary | ICD-10-CM | POA: Diagnosis present

## 2017-10-31 DIAGNOSIS — I10 Essential (primary) hypertension: Secondary | ICD-10-CM

## 2017-10-31 DIAGNOSIS — C61 Malignant neoplasm of prostate: Secondary | ICD-10-CM | POA: Diagnosis present

## 2017-10-31 DIAGNOSIS — K5900 Constipation, unspecified: Secondary | ICD-10-CM | POA: Diagnosis not present

## 2017-10-31 DIAGNOSIS — C7951 Secondary malignant neoplasm of bone: Secondary | ICD-10-CM

## 2017-10-31 DIAGNOSIS — R0602 Shortness of breath: Secondary | ICD-10-CM

## 2017-10-31 DIAGNOSIS — R51 Headache: Secondary | ICD-10-CM | POA: Diagnosis not present

## 2017-10-31 DIAGNOSIS — Z7952 Long term (current) use of systemic steroids: Secondary | ICD-10-CM | POA: Diagnosis not present

## 2017-10-31 DIAGNOSIS — G4700 Insomnia, unspecified: Secondary | ICD-10-CM

## 2017-10-31 DIAGNOSIS — I350 Nonrheumatic aortic (valve) stenosis: Secondary | ICD-10-CM

## 2017-10-31 DIAGNOSIS — D649 Anemia, unspecified: Secondary | ICD-10-CM | POA: Diagnosis present

## 2017-10-31 DIAGNOSIS — G4733 Obstructive sleep apnea (adult) (pediatric): Secondary | ICD-10-CM | POA: Diagnosis present

## 2017-10-31 DIAGNOSIS — I700 Atherosclerosis of aorta: Secondary | ICD-10-CM | POA: Diagnosis present

## 2017-10-31 LAB — CBC WITH DIFFERENTIAL/PLATELET
Basophils Absolute: 0 10*3/uL (ref 0.0–0.1)
Basophils Relative: 1 %
Eosinophils Absolute: 0 10*3/uL (ref 0.0–0.7)
Eosinophils Relative: 2 %
HCT: 40.1 % (ref 39.0–52.0)
Hemoglobin: 13.5 g/dL (ref 13.0–17.0)
Lymphocytes Relative: 47 %
Lymphs Abs: 0.8 10*3/uL (ref 0.7–4.0)
MCH: 31.5 pg (ref 26.0–34.0)
MCHC: 33.7 g/dL (ref 30.0–36.0)
MCV: 93.7 fL (ref 78.0–100.0)
Monocytes Absolute: 0.8 10*3/uL (ref 0.1–1.0)
Monocytes Relative: 46 %
Neutro Abs: 0.1 10*3/uL — ABNORMAL LOW (ref 1.7–7.7)
Neutrophils Relative %: 4 %
Platelets: 285 10*3/uL (ref 150–400)
RBC: 4.28 MIL/uL (ref 4.22–5.81)
RDW: 12.6 % (ref 11.5–15.5)
WBC: 1.7 10*3/uL — ABNORMAL LOW (ref 4.0–10.5)

## 2017-10-31 LAB — CBC
HCT: 36.3 % — ABNORMAL LOW (ref 39.0–52.0)
Hemoglobin: 12.1 g/dL — ABNORMAL LOW (ref 13.0–17.0)
MCH: 31.2 pg (ref 26.0–34.0)
MCHC: 33.3 g/dL (ref 30.0–36.0)
MCV: 93.6 fL (ref 78.0–100.0)
Platelets: 257 10*3/uL (ref 150–400)
RBC: 3.88 MIL/uL — ABNORMAL LOW (ref 4.22–5.81)
RDW: 12.6 % (ref 11.5–15.5)
WBC: 1.8 10*3/uL — ABNORMAL LOW (ref 4.0–10.5)

## 2017-10-31 LAB — BASIC METABOLIC PANEL
Anion gap: 7 (ref 5–15)
BUN: 16 mg/dL (ref 6–20)
CO2: 27 mmol/L (ref 22–32)
Calcium: 8.8 mg/dL — ABNORMAL LOW (ref 8.9–10.3)
Chloride: 102 mmol/L (ref 101–111)
Creatinine, Ser: 0.83 mg/dL (ref 0.61–1.24)
GFR calc Af Amer: >60 mL/min (ref >60)
GFR calc non Af Amer: >60 mL/min (ref >60)
Glucose, Bld: 118 mg/dL — ABNORMAL HIGH (ref 65–99)
Potassium: 4.5 mmol/L (ref 3.5–5.1)
Sodium: 136 mmol/L (ref 135–145)

## 2017-10-31 MED ORDER — IMIPENEM-CILASTATIN 500 MG IV SOLR
500.0000 mg | Freq: Four times a day (QID) | INTRAVENOUS | Status: DC
  Administered 2017-10-31 – 2017-11-02 (×9): 500 mg via INTRAVENOUS
  Filled 2017-10-31 (×12): qty 500

## 2017-10-31 MED ORDER — MAGIC MOUTHWASH W/LIDOCAINE
5.0000 mL | Freq: Four times a day (QID) | ORAL | Status: DC | PRN
Start: 2017-10-31 — End: 2017-11-02
  Filled 2017-10-31: qty 5

## 2017-10-31 MED ORDER — OLMESARTAN MEDOXOMIL 40 MG PO TABS
40.0000 mg | ORAL_TABLET | Freq: Every day | ORAL | Status: DC
Start: 2017-10-31 — End: 2017-11-02
  Administered 2017-11-01 – 2017-11-02 (×2): 40 mg via ORAL
  Filled 2017-10-31 (×3): qty 1

## 2017-10-31 MED ORDER — TBO-FILGRASTIM 480 MCG/0.8ML ~~LOC~~ SOSY
480.0000 ug | PREFILLED_SYRINGE | Freq: Every day | SUBCUTANEOUS | Status: AC
  Administered 2017-10-31: 480 ug via SUBCUTANEOUS
  Filled 2017-10-31: qty 0.8

## 2017-10-31 NOTE — Progress Notes (Signed)
Pharmacy Antibiotic Note  Alejandro [name]. is a 62 y.o. male admitted on 10/30/2017 with febrile neutropenia.  Pharmacy has been consulted for Primaxin dosing.  Plan: primaxin 500mg  iv q6hr  Height: 5\' 10"  (177.8 cm) Weight: 241 lb 2.9 oz (109.4 kg) IBW/kg (Calculated) : 73  Temp (24hrs), Avg:99.6 F (37.6 C), Min:99.1 F (37.3 C), Max:100.1 F (37.8 C)  Recent Labs  Lab 10/30/17 2010 10/30/17 2027  WBC 1.4*  --   CREATININE 0.83  --   LATICACIDVEN  --  1.48    Estimated Creatinine Clearance: 117.3 mL/min (by C-G formula based on SCr of 0.83 mg/dL).    Allergies  Allergen Reactions  . Codeine     REACTION: Nausea    Antimicrobials this admission: Primaxin 500mg  iv q6hr  Dose adjustments this admission: -  Microbiology results: -  Thank you for allowing pharmacy to be a part of this patient's care.  Nani Skillern Crowford 10/31/2017 3:37 AM

## 2017-10-31 NOTE — Consult Note (Signed)
[place]  [phone]   HEMATOLOGY ONCOLOGY INPATIENT CONSULTATION   Alejandro Lockner Jr.  DOB: 1955-10-26  MR#: [id number]  CSN#: [id number]    Requesting Physician: [facility]  Patient Care Team: [name], MD as PCP - General Josue Hector, MD as Consulting Physician (Cardiology) Alejandro Rhodes, MD as Referring Physician (Urology) Alejandro Portela, MD as Consulting Physician (Oncology)  Reason for consult: Metastatic prostate cancer, neutropenic fever  History of present illness: Alejandro [name] is a 62 year old gentleman, with past medical history of prostate cancer, recently treated at [facility] in [place], with first cycle of docetaxel on 10/22/2017, presented with fever to the emergency room yesterday.  He was admitted for neutropenic fever.  He was diagnosed with prostate cancer in July 2015, Gleason score 8 (4+4) with high volume disease and pelvic adenopathy.  He was started on androgen deprivation, see by my partner Dr. Alen Blew. He went retroperitoneal lymph node dissection, and radiation with prostate seeds and external beam radiation.  He was last seen by Dr. Alen Blew in 06/2014, he went to [facility] and has been receiving treatment there.  He was subsequently found to have bone metastasis, and started therapy with docetaxel last week.  He tolerated first cycle therapy well, had mild fatigue.  He back home a few days ago, and came into was along emergency room last night due to fever 101 at home.  He has a mild mouth sore no significant cough,, dyspnea, or abdominal discomfort.  He has mild loose bowel movement, no diarrhea. No dysuria or other symptoms.  Note, patient is scheduled to see Dr. Alen Blew in our cancer center on November 02, 2017.   MEDICAL HISTORY:  Past Medical History:  Diagnosis Date  . ANXIETY   . AORTIC STENOSIS, MILD   . CAD   . CHEST PAIN-UNSPECIFIED   . DYSPNEA   .  HYPERCHOLESTEROLEMIA   . Hypertension   . INSOMNIA   . OSA on CPAP 05/31/2013  . OSTEOARTHRITIS, KNEE   . Prostate cancer [facility])     SURGICAL HISTORY: Past Surgical History:  Procedure Laterality Date  . COLONOSCOPY    . ELBOW SURGERY    . LUMBAR LAMINECTOMY  Late 1990's  . NEUROPLASTY / TRANSPOSITION MEDIAN NERVE AT CARPAL TUNNEL BILATERAL    . PROSTATE BIOPSY      SOCIAL HISTORY: Social History   Socioeconomic History  . Marital status: Divorced    Spouse name: Not on file  . Number of children: 1  . Years of education: college  . Highest education level: Not on file  Occupational History  . Occupation: CONSULTANT    Employer: [employer]  Social Needs  . Financial resource strain: Not on file  . Food insecurity:    Worry: Not on file    Inability: Not on file  . Transportation needs:    Medical: Not on file    Non-medical: Not on file  Tobacco Use  . Smoking status: Former Smoker    Packs/day: 3.00    Years: 25.00    Pack years: 75.00    Types: Cigars, Cigarettes  . Smokeless tobacco: Never Used  . Tobacco comment: Former 3 ppd smoker for 30 years quit heavy smoking 15 years ago  Substance and Sexual Activity  . Alcohol use: Yes    Alcohol/week: 6.0 oz    Types: 12 Standard drinks or equivalent per week    Comment: 2 drinks per day  .  Drug use: No  . Sexual activity: Yes  Lifestyle  . Physical activity:    Days per week: Not on file    Minutes per session: Not on file  . Stress: Not on file  Relationships  . Social connections:    Talks on phone: Not on file    Gets together: Not on file    Attends religious service: Not on file    Active member of club or organization: Not on file    Attends meetings of clubs or organizations: Not on file    Relationship status: Not on file  . Intimate partner violence:    Fear of current or ex partner: Not on file    Emotionally abused: Not on file    Physically abused: Not on file    Forced sexual  activity: Not on file  Other Topics Concern  . Not on file  Social History Narrative  . Not on file    FAMILY HISTORY: Family History  Problem Relation Age of Onset  . Cancer Father        prostate    ALLERGIES:  is allergic to codeine.  MEDICATIONS:  Current Facility-Administered Medications  Medication Dose Route Frequency Provider Last Rate Last Dose  . 0.9 %  sodium chloride infusion  250 mL Intravenous PRN Phillips Grout, MD      . ALPRAZolam Duanne Moron) tablet 0.5 mg  0.5 mg Oral QHS PRN Phillips Grout, MD   0.5 mg at 10/31/17 0004  . amLODipine (NORVASC) tablet 10 mg  10 mg Oral Daily Derrill Kay A, MD   10 mg at 10/31/17 1006  . aspirin EC tablet 325 mg  325 mg Oral Daily Derrill Kay A, MD   325 mg at 10/31/17 1006  . ezetimibe (ZETIA) tablet 10 mg  10 mg Oral Daily Derrill Kay A, MD   10 mg at 10/31/17 1006  . imipenem-cilastatin (PRIMAXIN) 500 mg in sodium chloride 0.9 % 100 mL IVPB  500 mg Intravenous Q6H Phillips Grout, MD   Stopped at 10/31/17 1220  . magic mouthwash w/lidocaine  5 mL Oral QID PRN Raiford Noble Latif, DO      . multivitamin with minerals tablet 1 tablet  1 tablet Oral Daily Derrill Kay A, MD   1 tablet at 10/31/17 1006  . nebivolol (BYSTOLIC) tablet 10 mg  10 mg Oral QPM Phillips Grout, MD   10 mg at 10/31/17 0038  . olmesartan (BENICAR) tablet 40 mg  40 mg Oral Daily Sheikh, Omair Latif, DO      . ondansetron [facility]) tablet 4 mg  4 mg Oral Q6H PRN Phillips Grout, MD       Or  . ondansetron [facility]) injection 4 mg  4 mg Intravenous Q6H PRN Derrill Kay A, MD      . predniSONE (DELTASONE) tablet 5 mg  5 mg Oral BID WC Derrill Kay A, MD   5 mg at 10/31/17 1005  . sodium chloride flush (NS) 0.9 % injection 3 mL  3 mL Intravenous Q12H Derrill Kay A, MD   3 mL at 10/30/17 2305  . sodium chloride flush (NS) 0.9 % injection 3 mL  3 mL Intravenous PRN Derrill Kay A, MD      . tamsulosin (FLOMAX) capsule 0.4 mg  0.4 mg Oral BID Derrill Kay A,  MD   0.4 mg at 10/31/17 1006    REVIEW OF SYSTEMS:   Constitutional: Denies fevers, chills or abnormal  night sweats Eyes: Denies blurriness of vision, double vision or watery eyes Ears, nose, mouth, throat, and face: Denies mucositis or sore throat Respiratory: Denies cough, dyspnea or wheezes Cardiovascular: Denies palpitation, chest discomfort or lower extremity swelling Gastrointestinal:  Denies nausea, heartburn or change in bowel habits Skin: Denies abnormal skin rashes Lymphatics: Denies new lymphadenopathy or easy bruising Neurological:Denies numbness, tingling or new weaknesses Behavioral/Psych: Mood is stable, no new changes  All other systems were reviewed with the patient and are negative.  PHYSICAL EXAMINATION: ECOG PERFORMANCE STATUS: 2 - Symptomatic, <50% confined to bed  Vitals:   10/31/17 0400 10/31/17 1312  BP: 134/70 (!) 122/58  Pulse: 67 60  Resp: 12 16  Temp: 99 F (37.2 C) 99.9 F (37.7 C)  SpO2: 98% 98%   Filed Weights   10/30/17 1938 10/30/17 2136 10/30/17 2346  Weight: 250 lb (113.4 kg) 250 lb (113.4 kg) 241 lb 2.9 oz (109.4 kg)    GENERAL:alert, no distress and comfortable SKIN: skin color, texture, turgor are normal, no rashes or significant lesions EYES: normal, conjunctiva are pink and non-injected, sclera clear OROPHARYNX:no exudate, no erythema and lips, buccal mucosa, and tongue normal  NECK: supple, thyroid normal size, non-tender, without nodularity LYMPH:  no palpable lymphadenopathy in the cervical, axillary or inguinal LUNGS: clear to auscultation and percussion with normal breathing effort HEART: regular rate & rhythm and no murmurs and no lower extremity edema ABDOMEN:abdomen soft, non-tender and normal bowel sounds Musculoskeletal:no cyanosis of digits and no clubbing  PSYCH: alert & oriented x 3 with fluent speech NEURO: no focal motor/sensory deficits  LABORATORY DATA:  I have reviewed the data as listed Lab Results  Component  Value Date   WBC 1.7 (L) 10/31/2017   HGB 13.5 10/31/2017   HCT 40.1 10/31/2017   MCV 93.7 10/31/2017   PLT 285 10/31/2017   Recent Labs    10/30/17 2010 10/31/17 0338  NA 136 136  K 4.1 4.5  CL 99* 102  CO2 26 27  GLUCOSE 113* 118*  BUN 21* 16  CREATININE 0.83 0.83  CALCIUM 9.0 8.8*  GFRNONAA >60 >60  GFRAA >60 >60  PROT 7.1  --   ALBUMIN 3.8  --   AST 23  --   ALT 40  --   ALKPHOS 84  --   BILITOT 0.9  --     RADIOGRAPHIC STUDIES: I have personally reviewed the radiological images as listed and agreed with the findings in the report. Dg Chest 2 View  Result Date: 10/30/2017 CLINICAL DATA:  Fever. Ongoing chemotherapy for metastatic prostate cancer. EXAM: CHEST - 2 VIEW COMPARISON:  10/07/2007 FINDINGS: The cardiomediastinal silhouette is within normal limits. Aortic atherosclerosis is noted. The lungs are mildly hyperinflated without evidence of airspace consolidation, edema, pleural effusion, pneumothorax. Old left rib fractures are noted. IMPRESSION: No active cardiopulmonary disease. Electronically Signed   By: Logan Bores M.D.   On: 10/30/2017 20:31    ASSESSMENT & PLAN: 62 year old male with history of metastatic prostate cancer to bones, status post first cycle of docetaxel on October 22, 2017, was admitted for neutropenic fever  1.  Neutropenic fever, ANC 0.1K, source of infection unclear  2.  Metastatic prostate cancer, status post first cycle of docetaxel  Recommendations: -His chest x-ray was negative in the ED, blood and urine cultures are still pending -With broad antibiotics -Not receive Neulasta or Neupogen after chemo, given the severe febrile neutropenia, I recommend granix 45mcg daily until [place] close to 1K, I  ordered this morning -will f/u as need. He will see Dr. Alen Blew after discharge   All questions were answered. The patient knows to call the clinic with any problems, questions or concerns.      Truitt Merle, MD 10/31/2017 3:23 PM

## 2017-10-31 NOTE — Progress Notes (Signed)
PROGRESS NOTE    Alejandro [name].  UKG:[id number] DOB: 03-20-1956 DOA: 10/30/2017 PCP: [name], MD   Brief Narrative:  Alejandro Parke. is a 62 y.o. male with medical history significant of recently diagnosed prostate cancer started chemotherapy 1 week ago comes in with fever at home.  Patient denies any nausea vomiting or diarrhea.  He denies any cough or shortness of breath.  He denies any abdominal pain.  He denies any rashes.  He denies any flulike symptoms.  He feels perfectly fine.  He was instructed to come the emergency department with any fever.  Patient noted to be neutropenic today and referred for admission for neutropenic fever.  Oncology was consulted and recommending Granix and continuing Primaxin IV q6h.   Assessment & Plan:   Principal Problem:   Neutropenic fever ([place]) Active Problems:   Essential hypertension   Clinical stage T1c N1 M0 adenocarcinoma of the prostate with a Gleason's score of 4+4 and a PSA of 6.4  Neutropenic Fever -Patient Recently diagnosed with Prostate Cancer and underwent Chemotherapy Tx with Docetaxel -Was febrile at Home up to 101.8 but TMax here has been 99 -CXR showed The cardiomediastinal silhouette is within normal limits. Aortic atherosclerosis is noted. The lungs are mildly hyperinflated without evidence of airspace consolidation, edema, pleural effusion, pneumothorax. Old left rib fractures are noted. -Urinalysis was Clear with Negative Leukocytes or Nitrites; Urine Cx pending  -Blood Cx pending -WBC today was 1.8 and repeat was 1.7; ANC  Was 100 this AM -C/w IV Imipenem-Cilastin for now per Oncology Recommendations  -Patient was started on Tbo-Filgrastim by Oncology this AM -C/w Prednisone 5 mg po BID -Medical Oncology Dr. Burr Medico consulted and appreciate evaluation and recommendations  Mucositis -Added Magic Mouthwash 5 mL QIDprn  Normocytic Anemia -Suspected Chemotherapy induced -Hb/Hct now 13.5/93.7 -Continue  to Monitor for S/Sx of Bleeding -Repeat CBC in AM   Essential HTN -C/w Amlodipine 10 mg po Daily and Nebivolol 10 mg po qHS -Patient on Olmesartan 40 mg po Daily but was not ordered on Admission; Will have Pharmacy reorder -Continue to Monitor BP very carefully  Hx of CAD -C/w ASA 325 mg po Daily, Nebivolol 10 mg po qHS and Ezetimibe 10 mg po Daily;  -Olmesartan 40 mg po Daily but was not ordered on Admission and will re-order  OSA -C/w CPAP  Anxiety -C/w Alprazolam 0.5 mg po qHSprn  Hyperglycemia -Blood Sugars ranging from 113-118 -Check HbA1c -If Blood Sugars consistently elevated start SSI  Prostate Cancer (Adenocarcinoma Stage T1cN1M0 with Gleason Score of 4+4) -PSA was 6.4 -C/w Tamsulosin 0.4 mg po BID and Prednisone 5 mg po BID -Medical Oncology to Evaluate and appreciate further recommendations   DVT prophylaxis: SCDs Code Status: FULL CODE Family Communication: No family present at bedside Disposition Plan: Remain inpatient for continued Treatment and evaluation; D/C home when medically stable  Consultants:   Medical Oncology  Procedures: None   Antimicrobials:  Anti-infectives (From admission, onward)   Start     Dose/Rate Route Frequency Ordered Stop   10/31/17 0600  imipenem-cilastatin (PRIMAXIN) 500 mg in sodium chloride 0.9 % 100 mL IVPB     500 mg 200 mL/hr over 30 Minutes Intravenous Every 6 hours 10/31/17 0339     10/30/17 2330  imipenem-cilastatin (PRIMAXIN) 500 mg in sodium chloride 0.9 % 100 mL IVPB  Status:  Discontinued     500 mg 200 mL/hr over 30 Minutes Intravenous Every 8 hours 10/30/17 2326 10/31/17 0339   10/30/17 2230  ceFEPIme (MAXIPIME) 2 g in sodium chloride 0.9 % 100 mL IVPB  Status:  Discontinued     2 g 200 mL/hr over 30 Minutes Intravenous Every 8 hours 10/30/17 2224 10/30/17 2256     Subjective: Seen and examined and was feeling ok. No CP, SOB, N/V,Diarrhea, Urinary Discomfort or burning. No lightheadedness or dizziness.  Stated his mouth was hurting. No other concerns.   Objective: Vitals:   10/30/17 2135 10/30/17 2136 10/30/17 2346 10/31/17 0400  BP: (!) 147/89  (!) 146/77 134/70  Pulse: 60  69 67  Resp: 18  16 12   Temp: 99.7 F (37.6 C)  99.1 F (37.3 C) 99 F (37.2 C)  TempSrc: Oral  Oral Oral  SpO2: 99%  99% 98%  Weight:  113.4 kg (250 lb) 109.4 kg (241 lb 2.9 oz)   Height:  5\' 10"  (1.778 m) 5\' 10"  (1.778 m)     Intake/Output Summary (Last 24 hours) at 10/31/2017 1309 Last data filed at 10/31/2017 0400 Gross per 24 hour  Intake 100 ml  Output -  Net 100 ml   Filed Weights   10/30/17 1938 10/30/17 2136 10/30/17 2346  Weight: 113.4 kg (250 lb) 113.4 kg (250 lb) 109.4 kg (241 lb 2.9 oz)   Examination: Physical Exam:  Constitutional: WN/WD obese Caucasian male in NAD and appears calm and comfortable Eyes: Lids and conjunctivae normal, sclerae anicteric  ENMT: External Ears, Nose appear normal. Grossly normal hearing. Neck: Appears normal, supple, no cervical masses, normal ROM, no appreciable thyromegaly; no JVD Respiratory: Diminished to auscultation bilaterally, no wheezing, rales, rhonchi or crackles. Normal respiratory effort and patient is not tachypenic. No accessory muscle use.  Cardiovascular: RRR, no murmurs / rubs / gallops. S1 and S2 auscultated. No extremity edema.  Abdomen: Soft, non-tender, non-distended. No masses palpated. No appreciable hepatosplenomegaly. Bowel sounds positive x4.  GU: Deferred. Musculoskeletal: No clubbing / cyanosis of digits/nails. No joint deformity upper and lower extremities.   Skin: No rashes on a limited skin eval. No induration; Warm and dry.  Neurologic: CN 2-12 grossly intact with no focal deficits.  Romberg sign and cerebellar reflexes not assessed.  Psychiatric: Normal judgment and insight. Alert and oriented x 3. Normal mood and appropriate affect.   Data Reviewed: I have personally reviewed following labs and imaging studies  CBC: Recent  Labs  Lab 10/30/17 2010 10/31/17 0338 10/31/17 0758  WBC 1.4* 1.8* 1.7*  NEUTROABS 0.2*  --  0.1*  HGB 13.3 12.1* 13.5  HCT 38.9* 36.3* 40.1  MCV 93.5 93.6 93.7  PLT 293 257 673   Basic Metabolic Panel: Recent Labs  Lab 10/30/17 2010 10/31/17 0338  NA 136 136  K 4.1 4.5  CL 99* 102  CO2 26 27  GLUCOSE 113* 118*  BUN 21* 16  CREATININE 0.83 0.83  CALCIUM 9.0 8.8*   GFR: Estimated Creatinine Clearance: 117.3 mL/min (by C-G formula based on SCr of 0.83 mg/dL). Liver Function Tests: Recent Labs  Lab 10/30/17 2010  AST 23  ALT 40  ALKPHOS 84  BILITOT 0.9  PROT 7.1  ALBUMIN 3.8   No results for input(s): LIPASE, AMYLASE in the last 168 hours. No results for input(s): AMMONIA in the last 168 hours. Coagulation Profile: Recent Labs  Lab 10/30/17 2010  INR 0.98   Cardiac Enzymes: No results for input(s): CKTOTAL, CKMB, CKMBINDEX, TROPONINI in the last 168 hours. BNP (last 3 results) No results for input(s): PROBNP in the last 8760 hours. HbA1C: No results for  input(s): HGBA1C in the last 72 hours. CBG: No results for input(s): GLUCAP in the last 168 hours. Lipid Profile: No results for input(s): CHOL, HDL, LDLCALC, TRIG, CHOLHDL, LDLDIRECT in the last 72 hours. Thyroid Function Tests: No results for input(s): TSH, T4TOTAL, FREET4, T3FREE, THYROIDAB in the last 72 hours. Anemia Panel: No results for input(s): VITAMINB12, FOLATE, FERRITIN, TIBC, IRON, RETICCTPCT in the last 72 hours. Sepsis Labs: Recent Labs  Lab 10/30/17 2027  LATICACIDVEN 1.48    No results found for this or any previous visit (from the past 240 hour(s)).   Radiology Studies: Dg Chest 2 View  Result Date: 10/30/2017 CLINICAL DATA:  Fever. Ongoing chemotherapy for metastatic prostate cancer. EXAM: CHEST - 2 VIEW COMPARISON:  10/07/2007 FINDINGS: The cardiomediastinal silhouette is within normal limits. Aortic atherosclerosis is noted. The lungs are mildly hyperinflated without evidence  of airspace consolidation, edema, pleural effusion, pneumothorax. Old left rib fractures are noted. IMPRESSION: No active cardiopulmonary disease. Electronically Signed   By: Logan Bores M.D.   On: 10/30/2017 20:31   Scheduled Meds: . amLODipine  10 mg Oral Daily  . aspirin  325 mg Oral Daily  . ezetimibe  10 mg Oral Daily  . multivitamin with minerals  1 tablet Oral Daily  . nebivolol  10 mg Oral QPM  . olmesartan  40 mg Oral Daily  . predniSONE  5 mg Oral BID WC  . sodium chloride flush  3 mL Intravenous Q12H  . tamsulosin  0.4 mg Oral BID   Continuous Infusions: . sodium chloride    . imipenem-cilastatin 500 mg (10/31/17 1147)    LOS: 0 days   Kerney Elbe, DO [facility] Pager [phone]  If 7PM-7AM, please contact night-coverage [email or web address] Password [email or web address] 10/31/2017, 1:09 PM

## 2017-11-01 ENCOUNTER — Telehealth: Payer: Self-pay | Admitting: Hematology

## 2017-11-01 LAB — PHOSPHORUS: Phosphorus: 3 mg/dL (ref 2.5–4.6)

## 2017-11-01 LAB — CBC WITH DIFFERENTIAL/PLATELET
Band Neutrophils: 15 %
Basophils Absolute: 0 10*3/uL (ref 0.0–0.1)
Basophils Relative: 1 %
Eosinophils Absolute: 0.1 10*3/uL (ref 0.0–0.7)
Eosinophils Relative: 3 %
HCT: 38.2 % — ABNORMAL LOW (ref 39.0–52.0)
Hemoglobin: 12.9 g/dL — ABNORMAL LOW (ref 13.0–17.0)
Lymphocytes Relative: 38 %
Lymphs Abs: 1.3 10*3/uL (ref 0.7–4.0)
MCH: 31.3 pg (ref 26.0–34.0)
MCHC: 33.8 g/dL (ref 30.0–36.0)
MCV: 92.7 fL (ref 78.0–100.0)
Metamyelocytes Relative: 4 %
Monocytes Absolute: 1.1 10*3/uL — ABNORMAL HIGH (ref 0.1–1.0)
Monocytes Relative: 31 %
Neutro Abs: 0.9 10*3/uL — ABNORMAL LOW (ref 1.7–7.7)
Neutrophils Relative %: 8 %
Platelets: 291 10*3/uL (ref 150–400)
RBC: 4.12 MIL/uL — ABNORMAL LOW (ref 4.22–5.81)
RDW: 12.5 % (ref 11.5–15.5)
WBC: 3.4 10*3/uL — ABNORMAL LOW (ref 4.0–10.5)
nRBC: 1 /100 WBC — ABNORMAL HIGH

## 2017-11-01 LAB — URINE CULTURE: Culture: NO GROWTH

## 2017-11-01 LAB — COMPREHENSIVE METABOLIC PANEL
ALT: 29 U/L (ref 17–63)
AST: 18 U/L (ref 15–41)
Albumin: 3.3 g/dL — ABNORMAL LOW (ref 3.5–5.0)
Alkaline Phosphatase: 79 U/L (ref 38–126)
Anion gap: 10 (ref 5–15)
BUN: 14 mg/dL (ref 6–20)
CO2: 25 mmol/L (ref 22–32)
Calcium: 9 mg/dL (ref 8.9–10.3)
Chloride: 100 mmol/L — ABNORMAL LOW (ref 101–111)
Creatinine, Ser: 0.8 mg/dL (ref 0.61–1.24)
GFR calc Af Amer: >60 mL/min (ref >60)
GFR calc non Af Amer: >60 mL/min (ref >60)
Glucose, Bld: 134 mg/dL — ABNORMAL HIGH (ref 65–99)
Potassium: 4 mmol/L (ref 3.5–5.1)
Sodium: 135 mmol/L (ref 135–145)
Total Bilirubin: 1 mg/dL (ref 0.3–1.2)
Total Protein: 6.7 g/dL (ref 6.5–8.1)

## 2017-11-01 LAB — MAGNESIUM: Magnesium: 2.1 mg/dL (ref 1.7–2.4)

## 2017-11-01 MED ORDER — SENNOSIDES-DOCUSATE SODIUM 8.6-50 MG PO TABS
1.0000 | ORAL_TABLET | Freq: Two times a day (BID) | ORAL | Status: DC
  Filled 2017-11-01: qty 1

## 2017-11-01 MED ORDER — POLYETHYLENE GLYCOL 3350 17 G PO PACK
17.0000 g | PACK | Freq: Two times a day (BID) | ORAL | Status: DC
  Filled 2017-11-01: qty 1

## 2017-11-01 MED ORDER — BUTALBITAL-APAP-CAFFEINE 50-325-40 MG PO TABS
2.0000 | ORAL_TABLET | Freq: Once | ORAL | Status: AC
  Administered 2017-11-01: 2 via ORAL
  Filled 2017-11-01: qty 2

## 2017-11-01 NOTE — Progress Notes (Signed)
PROGRESS NOTE    Alejandro [name].  FOY:[id number] DOB: 12/07/1955 DOA: 10/30/2017 PCP: [name], MD   Brief Narrative:  Alejandro Capistran. is a 62 y.o. male with medical history significant of recently diagnosed prostate cancer started chemotherapy 1 week ago comes in with fever at home.  Patient denies any nausea vomiting or diarrhea.  He denies any cough or shortness of breath.  He denies any abdominal pain.  He denies any rashes.  He denies any flulike symptoms.  He feels perfectly fine.  He was instructed to come the emergency department with any fever.  Patient noted to be neutropenic today and referred for admission for neutropenic fever.  Oncology was consulted and recommending Granix and continuing Primaxin IV q6h and was started on Granix. Possible D/C in AM if improved.   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 Aortic atherosclerosis is noted. The lungs are mildly hyperinflated without evidence of airspace consolidation, edema, pleural effusion, pneumothorax. Old left rib fractures are noted. -Urinalysis was Clear with Negative Leukocytes or Nitrites; Urine Cx showed No Growth  -Blood Cx showed NGTD at 1 day -WBC on admission was 1.8 and repeat was 3.4; ANC  Was 900 this AM -C/w IV Imipenem-Cilastin for now per Oncology Recommendations  -Patient was started on Tbo-Filgrastim by Oncology and will get another dose again this AM  -C/w Prednisone 5 mg po BID -Medical Oncology Alejandro [name] consulted and appreciate evaluation and  recommendations; Continue to Follow up with Recommendations; Per my discussion with Alejandro [name] today if patient feels well in AM and if Cx's are negative and ANC >1 then likely ok to D/C on po Abx; Dr. Alen [name] will be back in AM and will touch base with him prior to D/C   Mucositis -Added Magic Mouthwash 5 mL QIDprn  Normocytic Anemia -Suspected Chemotherapy induced -Hb/Hct now 12.9/38.2 -Continue to Monitor for S/Sx of Bleeding -Repeat CBC in AM   Essential HTN -C/w Amlodipine 10 mg po Daily and Nebivolol 10 mg po qHS and Olmesartan 40 mg po Daily  -Continue to Monitor BP very carefully  Hx of CAD -C/w ASA 325 mg po Daily, Nebivolol 10 mg po qHS and Ezetimibe 10 mg po Daily;  -Olmesartan 40 mg po Daily but was not ordered on Admission and will re-order  OSA -C/w CPAP  Anxiety -C/w Alprazolam 0.5 mg po qHSprn  Hyperglycemia -Blood Sugars ranging from 113-134 -Check HbA1c in AM  -If Blood Sugars consistently elevated start SSI  Prostate Cancer (Adenocarcinoma Stage T1cN1M0 with Gleason Score of 4+4) -PSA was 6.4 -C/w Tamsulosin 0.4 mg po BID and Prednisone 5 mg po BID -Medical Oncology to Evaluate and appreciate further recommendations   Constipation  -Added Miralax 17 grams po BID -C/w Senna-Docusate 1 tab po BID  Headache -Patient was given Fioricet 2 tab po this AM with relief -Stated he had a headache ever since starting Chemotherapy  DVT prophylaxis: SCDs Code Status: FULL CODE Family Communication: No family present at bedside Disposition Plan: Remain inpatient for continued Treatment and evaluation; D/C home when medically  stable  Consultants:   Medical Oncology  Procedures: None   Antimicrobials:  Anti-infectives (From admission, onward)   Start     Dose/Rate Route Frequency Ordered Stop   10/31/17 0600  imipenem-cilastatin (PRIMAXIN) 500 mg in sodium chloride 0.9 % 100 mL IVPB     500 mg 200 mL/hr over 30 Minutes Intravenous Every 6 hours 10/31/17 0339      10/30/17 2330  imipenem-cilastatin (PRIMAXIN) 500 mg in sodium chloride 0.9 % 100 mL IVPB  Status:  Discontinued     500 mg 200 mL/hr over 30 Minutes Intravenous Every 8 hours 10/30/17 2326 10/31/17 0339   10/30/17 2230  ceFEPIme (MAXIPIME) 2 g in sodium chloride 0.9 % 100 mL IVPB  Status:  Discontinued     2 g 200 mL/hr over 30 Minutes Intravenous Every 8 hours 10/30/17 2224 10/30/17 2256     Subjective: Seen and examined and stated he felt great. He slept last night and states headache has improved. No CP or SOB; No N/V. Complained to constipation. No other concerns or complaints at this time.   Objective: Vitals:   10/31/17 1312 10/31/17 2021 11/01/17 0406 11/01/17 1305  BP: (!) 122/58 123/73 127/72 128/76  Pulse: 60 67 62 (!) 57  Resp: 16 14 14 16   Temp: 99.9 F (37.7 C) 99.3 F (37.4 C) 98.2 F (36.8 C) 99.4 F (37.4 C)  TempSrc: Oral Oral Oral Oral  SpO2: 98% 99% 98% 99%  Weight:      Height:        Intake/Output Summary (Last 24 hours) at 11/01/2017 1453 Last data filed at 11/01/2017 1200 Gross per 24 hour  Intake 640 ml  Output 1850 ml  Net -1210 ml   Filed Weights   10/30/17 1938 10/30/17 2136 10/30/17 2346  Weight: 113.4 kg (250 lb) 113.4 kg (250 lb) 109.4 kg (241 lb 2.9 oz)   Examination: Physical Exam:  Constitutional: WN/WD obese Caucasian male in NAD appears calm and comfortable resting in bed Eyes: Sclerae anicteric; Lids normal ENMT: External Ears and Nose appear normal; Grossly normal hearing Neck: Supple with no JVD; Respiratory: Diminished to auscultation bilaterally but no appreciable wheezing/rales/rhonchi. No accessory muscle and unlabored breathing Cardiovascular: RRR; No appreciable m/r/g. No LE edema noted Abdomen: Soft, NT, ND. Bowel sounds present x4 GU: Deferred Musculoskeletal: No contractures; No cyanosis Skin: Warm and Dry; No appreciable rashes or lesions on a limited skin eval Neurologic: CN 2-12 grossly intact with no  appreciable focal deficits  Psychiatric: Normal mood and affect. Intact judgement and insight  Data Reviewed: I have personally reviewed following labs and imaging studies  CBC: Recent Labs  Lab 10/30/17 2010 10/31/17 0338 10/31/17 0758 11/01/17 0839  WBC 1.4* 1.8* 1.7* 3.4*  NEUTROABS 0.2*  --  0.1* 0.9*  HGB 13.3 12.1* 13.5 12.9*  HCT 38.9* 36.3* 40.1 38.2*  MCV 93.5 93.6 93.7 92.7  PLT 293 257 285 016   Basic Metabolic Panel: Recent Labs  Lab 10/30/17 2010 10/31/17 0338 11/01/17 0839  NA 136 136 135  K 4.1 4.5 4.0  CL 99* 102 100*  CO2 26 27 25   GLUCOSE 113* 118* 134*  BUN 21* 16 14  CREATININE 0.83 0.83 0.80  CALCIUM 9.0 8.8* 9.0  MG  --   --  2.1  PHOS  --   --  3.0   GFR: Estimated Creatinine Clearance: 121.7 mL/min (by C-G formula based on SCr of 0.8 mg/dL). Liver Function Tests: Recent Labs  Lab 10/30/17 2010  11/01/17 0839  AST 23 18  ALT 40 29  ALKPHOS 84 79  BILITOT 0.9 1.0  PROT 7.1 6.7  ALBUMIN 3.8 3.3*   No results for input(s): LIPASE, AMYLASE in the last 168 hours. No results for input(s): AMMONIA in the last 168 hours. Coagulation Profile: Recent Labs  Lab 10/30/17 2010  INR 0.98   Cardiac Enzymes: No results for input(s): CKTOTAL, CKMB, CKMBINDEX, TROPONINI in the last 168 hours. BNP (last 3 results) No results for input(s): PROBNP in the last 8760 hours. HbA1C: No results for input(s): HGBA1C in the last 72 hours. CBG: No results for input(s): GLUCAP in the last 168 hours. Lipid Profile: No results for input(s): CHOL, HDL, LDLCALC, TRIG, CHOLHDL, LDLDIRECT in the last 72 hours. Thyroid Function Tests: No results for input(s): TSH, T4TOTAL, FREET4, T3FREE, THYROIDAB in the last 72 hours. Anemia Panel: No results for input(s): VITAMINB12, FOLATE, FERRITIN, TIBC, IRON, RETICCTPCT in the last 72 hours. Sepsis Labs: Recent Labs  Lab 10/30/17 2027  LATICACIDVEN 1.48    Recent Results (from the past 240 hour(s))  Culture,  blood (Routine x 2)     Status: None (Preliminary result)   Collection Time: 10/30/17  8:08 PM  Result Value Ref Range Status   Specimen Description   Final    BLOOD LEFT ANTECUBITAL Performed at [facility] [address], [place], [place] [address]    Special Requests   Final    BOTTLES DRAWN AEROBIC AND ANAEROBIC Blood Culture adequate volume Performed at [facility] [address] [place], [place] [address]    Culture   Final    NO GROWTH 1 DAY Performed at [facility], [place] [address] [place], [place] [address]    Report Status PENDING  Incomplete  Culture, blood (Routine x 2)     Status: None (Preliminary result)   Collection Time: 10/30/17  8:10 PM  Result Value Ref Range Status   Specimen Description   Final    BLOOD RIGHT ANTECUBITAL Performed at [facility] [address] [place], [place] [address]    Special Requests   Final    BOTTLES DRAWN AEROBIC AND ANAEROBIC Blood Culture adequate volume Performed at [facility] [address] [place], [place] [address]    Culture   Final    NO GROWTH 1 DAY Performed at [facility], [place] [address] [place], [place] [address]    Report Status PENDING  Incomplete  Urine Culture     Status: None   Collection Time: 10/30/17  9:13 PM  Result Value Ref Range Status   Specimen Description   Final    URINE, RANDOM Performed at [facility] [address], [place], [place] [address]    Special Requests   Final    NONE Performed at [facility], [place] [address], [place], [place] [address]    Culture   Final    NO GROWTH Performed at [facility], [place] [address] [place], [place] [address]    Report Status 11/01/2017 FINAL  Final     Radiology Studies: Dg Chest 2 View  Result Date: 10/30/2017 CLINICAL DATA:  Fever. Ongoing chemotherapy for metastatic prostate cancer. EXAM: CHEST - 2  VIEW COMPARISON:  10/07/2007 FINDINGS: The cardiomediastinal silhouette is within normal limits. Aortic atherosclerosis is noted. The lungs are mildly hyperinflated without evidence of airspace consolidation, edema, pleural effusion, pneumothorax. Old left rib fractures  are noted. IMPRESSION: No active cardiopulmonary disease. Electronically Signed   By: Logan Bores M.D.   On: 10/30/2017 20:31   Scheduled Meds: . amLODipine  10 mg Oral Daily  . aspirin  325 mg Oral Daily  . ezetimibe  10 mg Oral Daily  . multivitamin with minerals  1 tablet Oral Daily  . nebivolol  10 mg Oral QPM  . olmesartan  40 mg Oral Daily  . polyethylene glycol  17 g Oral BID  . predniSONE  5 mg Oral BID WC  . senna-docusate  1 tablet Oral BID  . sodium chloride flush  3 mL Intravenous Q12H  . tamsulosin  0.4 mg Oral BID   Continuous Infusions: . sodium chloride    . imipenem-cilastatin 500 mg (11/01/17 1204)    LOS: 1 day   Kerney Elbe, DO [facility] Pager [phone]  If 7PM-7AM, please contact night-coverage [email or web address] Password [facility] 11/01/2017, 2:53 PM

## 2017-11-02 ENCOUNTER — Telehealth: Payer: Self-pay | Admitting: Oncology

## 2017-11-02 ENCOUNTER — Ambulatory Visit: Payer: Self-pay | Admitting: Oncology

## 2017-11-02 ENCOUNTER — Other Ambulatory Visit: Payer: Self-pay | Admitting: Oncology

## 2017-11-02 DIAGNOSIS — C61 Malignant neoplasm of prostate: Secondary | ICD-10-CM

## 2017-11-02 LAB — COMPREHENSIVE METABOLIC PANEL
ALT: 26 U/L (ref 17–63)
AST: 16 U/L (ref 15–41)
Albumin: 3.2 g/dL — ABNORMAL LOW (ref 3.5–5.0)
Alkaline Phosphatase: 79 U/L (ref 38–126)
Anion gap: 9 (ref 5–15)
BUN: 16 mg/dL (ref 6–20)
CO2: 27 mmol/L (ref 22–32)
Calcium: 9 mg/dL (ref 8.9–10.3)
Chloride: 102 mmol/L (ref 101–111)
Creatinine, Ser: 0.72 mg/dL (ref 0.61–1.24)
GFR calc Af Amer: >60 mL/min (ref >60)
GFR calc non Af Amer: >60 mL/min (ref >60)
Glucose, Bld: 134 mg/dL — ABNORMAL HIGH (ref 65–99)
Potassium: 4.1 mmol/L (ref 3.5–5.1)
Sodium: 138 mmol/L (ref 135–145)
Total Bilirubin: 0.5 mg/dL (ref 0.3–1.2)
Total Protein: 6.6 g/dL (ref 6.5–8.1)

## 2017-11-02 LAB — CBC WITH DIFFERENTIAL/PLATELET
Basophils Absolute: 0.1 10*3/uL (ref 0.0–0.1)
Basophils Relative: 1 %
Eosinophils Absolute: 0 10*3/uL (ref 0.0–0.7)
Eosinophils Relative: 0 %
HCT: 38.6 % — ABNORMAL LOW (ref 39.0–52.0)
Hemoglobin: 12.6 g/dL — ABNORMAL LOW (ref 13.0–17.0)
Lymphocytes Relative: 20 %
Lymphs Abs: 1.3 10*3/uL (ref 0.7–4.0)
MCH: 30.7 pg (ref 26.0–34.0)
MCHC: 32.6 g/dL (ref 30.0–36.0)
MCV: 93.9 fL (ref 78.0–100.0)
Monocytes Absolute: 1.6 10*3/uL — ABNORMAL HIGH (ref 0.1–1.0)
Monocytes Relative: 24 %
Neutro Abs: 3.5 10*3/uL (ref 1.7–7.7)
Neutrophils Relative %: 55 %
Platelets: 311 10*3/uL (ref 150–400)
RBC: 4.11 MIL/uL — ABNORMAL LOW (ref 4.22–5.81)
RDW: 12.4 % (ref 11.5–15.5)
WBC: 6.5 10*3/uL (ref 4.0–10.5)

## 2017-11-02 LAB — PHOSPHORUS: Phosphorus: 3.3 mg/dL (ref 2.5–4.6)

## 2017-11-02 LAB — MAGNESIUM: Magnesium: 2.3 mg/dL (ref 1.7–2.4)

## 2017-11-02 MED ORDER — MAGIC MOUTHWASH W/LIDOCAINE: 5.0000 mL | Freq: Four times a day (QID) | ORAL | 0 refills | Status: DC | PRN

## 2017-11-02 MED ORDER — BUTALBITAL-APAP-CAFFEINE 50-325-40 MG PO TABS: 1.0000 | ORAL_TABLET | Freq: Four times a day (QID) | ORAL | 0 refills | Status: DC | PRN

## 2017-11-02 NOTE — Progress Notes (Signed)
Events noted in the last few days.  Patient seen and examined today and appears to be fully recovered.  White cell count is back to normal without any fevers.  I have no objections to discharge today without any antibiotics.  We will arrange follow-up at the cancer center within the next week.

## 2017-11-02 NOTE — Discharge Summary (Signed)
Physician Discharge Summary  [facility]. NLG:[id number] DOB: 1955/09/03 DOA: 10/30/2017  PCP: [name], MD  Admit date: 10/30/2017 Discharge date: 11/02/2017  Admitted From: Home Disposition: Home  Recommendations for Outpatient Follow-up:  1. Follow up with PCP in 1-2 weeks 2. Follow up with Dr. Alen Blew in Oncology within 1 week  3. Please obtain CMP/CBC, Mag, Phos in one week 4. Please follow up on the following pending results:  Home Health: No Equipment/Devices: None  Discharge Condition: Stable CODE STATUS: FULL CODE Diet recommendation: Heart Healthy Diet  Brief/Interim Summary: Alejandro Hoganis a 62 y.o.malewith medical history significant ofrecently diagnosed prostate cancer started chemotherapy 1 week ago comes in with fever at home. Patient denies any nausea vomiting or diarrhea. He denies any cough or shortness of breath. He denies any abdominal pain. He denies any rashes. He denies any flulike symptoms. He feels perfectly fine. He was instructed to come the emergency department with any fever. Patient noted to be neutropenic today and referred for admission for neutropenic fever. Oncology was consulted and recommending Granix and continuing Primaxin IV q6h and was started on Granix. His ANC and WBC improved and IV Abx were discontinued. He felt improved and was deemed medically stable to D/C Home and follow up with Dr. Alen Blew in the coming weeks for the continuation of chemotherapy.  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 Aortic atherosclerosis is noted. The lungs are mildly hyperinflated without evidence of airspace consolidation, edema, pleural effusion, pneumothorax. Old left rib fractures are noted. -Urinalysis was Clear with Negative Leukocytes or Nitrites; Urine Cx showed No Growth  -Blood Cx showed NGTD at 2 day -WBC on admission was 1.8 and repeat today was 6.5; ANC  Was 3500 this AM -C/w IV Imipenem-Cilastin for now per Oncology Recommendations  -Patient was started on Tbo-Filgrastim by Oncology and now stopped  -C/w Prednisone 5 mg po BID -Medical Oncology Dr. Burr Medico consulted and appreciate evaluation and recommendations; Continue to Follow up with Recommendations; Per my discussion with Dr. Burr Medico yesterday if patient feels well in AM and if Cx's are negative and ANC >1 then likely ok to D/C on po Abx; -I talked with Dr. Alen Blew and he is comfortable with D/C home on no Abx and will follow up for continued Chemotherapy in the coming weeks  Mucositis -Added Magic Mouthwash 5 mL QIDprn and will continue at D/C   Normocytic Anemia -Suspected Chemotherapy induced -Hb/Hct now stable at 12.6/38.6 -Continue to Monitor for S/Sx of Bleeding -Repeat CBC in AM   Essential HTN -C/w Amlodipine 10 mg po Daily and Nebivolol 10 mg po qHS and Olmesartan 40 mg po Daily  -Continue to Monitor BP very carefully  Hx of CAD -C/w ASA 325 mg po Daily, Nebivolol 10 mg po qHS and Ezetimibe 10 mg po Daily; and Olmesartan 40 mg po Daily   OSA -C/w CPAP  Anxiety -C/w Alprazolam 0.5 mg po qHSprn  Hyperglycemia -Blood Sugars ranging from 113-134 -Check HbA1c as an outpatient  -If Blood Sugars consistently elevated start SSI  Prostate Cancer (Adenocarcinoma Stage  [id number] with Gleason Score of 4+4) -PSA was 6.4 -C/w Tamsulosin 0.4 mg po BID and Prednisone 5 mg po BID -Medical Oncology to Evaluated and appreciate further recommendations -Follow up with Dr.  Alen Blew at D/C  Constipation, improved -Added Miralax 17 grams po BID -C/w Senna-Docusate 1 tab po BID  Headache -Patient was given Fioricet 2 tab po yesterday AM with relief -Stated he had a headache ever since starting Chemotherapy and finally resolved -Will write script for Fioricet at D/C  Discharge Instructions  Discharge Instructions    Call MD for:  difficulty breathing, headache or visual disturbances   Complete by:  As directed    Call MD for:  extreme fatigue   Complete by:  As directed    Call MD for:  hives   Complete by:  As directed    Call MD for:  persistant dizziness or light-headedness   Complete by:  As directed    Call MD for:  persistant nausea and vomiting   Complete by:  As directed    Call MD for:  redness, tenderness, or signs of infection (pain, swelling, redness, odor or green/yellow discharge around incision site)   Complete by:  As directed    Call MD for:  severe uncontrolled pain   Complete by:  As directed    Call MD for:  temperature >100.4   Complete by:  As directed    Diet - low sodium heart healthy   Complete by:  As directed    Discharge instructions   Complete by:  As directed    Follow up with PCP and Oncology Dr. Alen Blew as an outpatient. Take all medications as prescribed. If symptoms change or worsen please return to the ED for evaluation.   Increase activity slowly   Complete by:  As directed      Allergies as of 11/02/2017      Reactions   Codeine    REACTION: Nausea      Medication List    TAKE these medications   ALPRAZolam 0.5 MG tablet Commonly known as:  XANAX Take 0.5 mg by mouth at bedtime as needed for sleep.   amLODipine 10 MG tablet Commonly known as:  NORVASC Take 10 mg by mouth daily.   aspirin 325 MG EC tablet Take 325 mg by mouth daily.   BENICAR 40 MG tablet Generic drug:  olmesartan Take 1 tablet by mouth daily.   butalbital-acetaminophen-caffeine 50-325-40 MG tablet Commonly known as:   FIORICET, ESGIC Take 1-2 tablets by mouth every 6 (six) hours as needed for headache.   BYSTOLIC 10 MG tablet Generic drug:  nebivolol Take 10 mg by mouth every evening.   dexamethasone 4 MG tablet Commonly known as:  DECADRON Take 8 mg by mouth 2 (two) times daily. Take 2 tablets BID for 3 Days before chemo, the day of chemo and the day after chemo   ezetimibe 10 MG tablet Commonly known as:  ZETIA Take 10 mg by mouth daily.   magic mouthwash w/lidocaine Soln Take 5 mLs by mouth 4 (four) times daily as needed for mouth pain.   multivitamin tablet Take 1 tablet by mouth daily.   ondansetron 8 MG tablet Commonly known as:  ZOFRAN Take 8 mg by mouth every 8 (eight) hours as needed for nausea or vomiting.   PRALUENT [place] Inject 75 mg into the skin every 14 (fourteen) days.   predniSONE 5 MG tablet Commonly known as:  DELTASONE Take 5 mg by mouth 2 (  two) times daily with a meal.   tamsulosin 0.4 MG Caps capsule Commonly known as:  FLOMAX Take 0.4 mg by mouth 2 (two) times daily.   VIAGRA 100 MG tablet Generic drug:  sildenafil Take 1 tablet by mouth as needed for erectile dysfunction.       Allergies  Allergen Reactions  . Codeine     REACTION: Nausea   Consultations:  Hematology  Procedures/Studies: Dg Chest 2 View  Result Date: 10/30/2017 CLINICAL DATA:  Fever. Ongoing chemotherapy for metastatic prostate cancer. EXAM: CHEST - 2 VIEW COMPARISON:  10/07/2007 FINDINGS: The cardiomediastinal silhouette is within normal limits. Aortic atherosclerosis is noted. The lungs are mildly hyperinflated without evidence of airspace consolidation, edema, pleural effusion, pneumothorax. Old left rib fractures are noted. IMPRESSION: No active cardiopulmonary disease. Electronically Signed   By: Logan Bores M.D.   On: 10/30/2017 20:31     Subjective: Seen and examined at bedside and no complaints. Slept ok. No CP or SOB. Ready to go home.  Discharge Exam: Vitals:   11/01/17  1958 11/02/17 0417  BP: 128/70 130/75  Pulse: (!) 56 60  Resp: 14 16  Temp: 98.8 F (37.1 C) 98.8 F (37.1 C)  SpO2: 95% 97%   Vitals:   11/01/17 0406 11/01/17 1305 11/01/17 1958 11/02/17 0417  BP: 127/72 128/76 128/70 130/75  Pulse: 62 (!) 57 (!) 56 60  Resp: 14 16 14 16   Temp: 98.2 F (36.8 C) 99.4 F (37.4 C) 98.8 F (37.1 C) 98.8 F (37.1 C)  TempSrc: Oral Oral Oral Oral  SpO2: 98% 99% 95% 97%  Weight:      Height:       General: Pt is pleasant obese Caucasian male who is alert, awake, not in acute distress Cardiovascular: RRR, S1/S2 +, no rubs, no gallops Respiratory: CTA bilaterally, no wheezing, no rhonchi Abdominal: Soft, NT, ND, bowel sounds + Extremities: no edema, no cyanosis  The results of significant diagnostics from this hospitalization (including imaging, microbiology, ancillary and laboratory) are listed below for reference.    Microbiology: Recent Results (from the past 240 hour(s))  Culture, blood (Routine x 2)     Status: None (Preliminary result)   Collection Time: 10/30/17  8:08 PM  Result Value Ref Range Status   Specimen Description   Final    BLOOD LEFT ANTECUBITAL Performed at [place] [address], [place], [place] [address]    Special Requests   Final    BOTTLES DRAWN AEROBIC AND ANAEROBIC Blood Culture adequate volume Performed at [place] [address], [place], [place] [address]    Culture   Final    NO GROWTH 1 DAY Performed at [facility], [place] [address], [place], [place] [address]    Report Status PENDING  Incomplete  Culture, blood (Routine x 2)     Status: None (Preliminary result)   Collection Time: 10/30/17  8:10 PM  Result Value Ref Range Status   Specimen Description   Final    BLOOD RIGHT ANTECUBITAL Performed at [place] [address] [place], [place] [address]    Special Requests   Final    BOTTLES DRAWN AEROBIC AND ANAEROBIC Blood  Culture adequate volume Performed at [place] [address] [place], [place] [address]    Culture   Final    NO GROWTH 1 DAY Performed at [facility], [place] [address], [place], [place] [address]  Report Status PENDING  Incomplete  Urine Culture     Status: None   Collection Time: 10/30/17  9:13 PM  Result Value Ref Range Status   Specimen Description   Final    URINE, RANDOM Performed at [facility] [address], [place], [place] [address]    Special Requests   Final    NONE Performed at [facility], [place] [address], [place], [place] [address]    Culture   Final    NO GROWTH Performed at [facility], [place] [address] [place], [place] [address]    Report Status 11/01/2017 FINAL  Final    Labs: BNP (last 3 results) No results for input(s): BNP in the last 8760 hours. Basic Metabolic Panel: Recent Labs  Lab 10/30/17 2010 10/31/17 0338 11/01/17 0839 11/02/17 0412  NA 136 136 135 138  K 4.1 4.5 4.0 4.1  CL 99* 102 100* 102  CO2 26 27 25 27   GLUCOSE 113* 118* 134* 134*  BUN 21* 16 14 16   CREATININE 0.83 0.83 0.80 0.72  CALCIUM 9.0 8.8* 9.0 9.0  MG  --   --  2.1 2.3  PHOS  --   --  3.0 3.3   Liver Function Tests: Recent Labs  Lab 10/30/17 2010 11/01/17 0839 11/02/17 0412  AST 23 18 16   ALT 40 29 26  ALKPHOS 84 79 79  BILITOT 0.9 1.0 0.5  PROT 7.1 6.7 6.6  ALBUMIN 3.8 3.3* 3.2*   No results for input(s): LIPASE, AMYLASE in the last 168 hours. No results for input(s): AMMONIA in the last 168 hours. CBC: Recent Labs  Lab 10/30/17 2010 10/31/17 0338 10/31/17 0758 11/01/17 0839 11/02/17 0412  WBC 1.4* 1.8* 1.7* 3.4* 6.5  NEUTROABS 0.2*  --  0.1* 0.9* 3.5  HGB 13.3 12.1* 13.5 12.9* 12.6*  HCT 38.9* 36.3* 40.1 38.2* 38.6*  MCV 93.5 93.6 93.7 92.7 93.9  PLT 293 257 285 291 311   Cardiac Enzymes: No results for input(s): CKTOTAL, CKMB, CKMBINDEX, TROPONINI in the last 168  hours. BNP: Invalid input(s): POCBNP CBG: No results for input(s): GLUCAP in the last 168 hours. D-Dimer No results for input(s): DDIMER in the last 72 hours. Hgb A1c No results for input(s): HGBA1C in the last 72 hours. Lipid Profile No results for input(s): CHOL, HDL, LDLCALC, TRIG, CHOLHDL, LDLDIRECT in the last 72 hours. Thyroid function studies No results for input(s): TSH, T4TOTAL, T3FREE, THYROIDAB in the last 72 hours.  Invalid input(s): FREET3 Anemia work up No results for input(s): VITAMINB12, FOLATE, FERRITIN, TIBC, IRON, RETICCTPCT in the last 72 hours. Urinalysis    Component Value Date/Time   COLORURINE YELLOW 10/30/2017 2113   APPEARANCEUR CLEAR 10/30/2017 2113   LABSPEC 1.015 10/30/2017 2113   PHURINE 5.0 10/30/2017 2113   GLUCOSEU NEGATIVE 10/30/2017 2113   HGBUR NEGATIVE 10/30/2017 2113   BILIRUBINUR NEGATIVE 10/30/2017 2113   [place] NEGATIVE 10/30/2017 2113   PROTEINUR NEGATIVE 10/30/2017 2113   NITRITE NEGATIVE 10/30/2017 2113   LEUKOCYTESUR NEGATIVE 10/30/2017 2113   Sepsis Labs Invalid input(s): PROCALCITONIN,  WBC,  LACTICIDVEN Microbiology Recent Results (from the past 240 hour(s))  Culture, blood (Routine x 2)     Status: None (Preliminary result)   Collection Time: 10/30/17  8:08 PM  Result Value Ref Range Status   Specimen Description   Final    BLOOD LEFT ANTECUBITAL Performed at [facility], [place] [address], [place], [place] [address]  Special Requests   Final    BOTTLES DRAWN AEROBIC AND ANAEROBIC Blood Culture adequate volume Performed at [place] [address], [place], [place] [address]    Culture   Final    NO GROWTH 1 DAY Performed at [facility], [place] [address] [place], [place] [address]    Report Status PENDING  Incomplete  Culture, blood (Routine x 2)     Status: None (Preliminary result)   Collection Time: 10/30/17  8:10 PM  Result Value Ref Range Status   Specimen  Description   Final    BLOOD RIGHT ANTECUBITAL Performed at [place] [address], [place], [place] [address]    Special Requests   Final    BOTTLES DRAWN AEROBIC AND ANAEROBIC Blood Culture adequate volume Performed at [facility] [address], [place], [place] [address]    Culture   Final    NO GROWTH 1 DAY Performed at [facility], [place] [address], [place], [place] [address]    Report Status PENDING  Incomplete  Urine Culture     Status: None   Collection Time: 10/30/17  9:13 PM  Result Value Ref Range Status   Specimen Description   Final    URINE, RANDOM Performed at [facility] [address], [place], [place] [address]    Special Requests   Final    NONE Performed at [facility], [place] [address] [place], [place] [address]    Culture   Final    NO GROWTH Performed at [facility], [place] [address] [place], [place] [address]    Report Status 11/01/2017 FINAL  Final   Time coordinating discharge: 35 minutes  SIGNED:  Kerney Elbe, DO [facility] 11/02/2017, 9:30 AM Pager [phone]  If 7PM-7AM, please contact night-coverage [email or web address] Password [email or web address]

## 2017-11-02 NOTE — Progress Notes (Signed)
START ON PATHWAY REGIMEN - Prostate     A cycle is every 21 days:     Docetaxel      Prednisone   **Always confirm dose/schedule in your pharmacy ordering system**    Patient Characteristics: Adenocarcinoma, Metastatic, Castration Resistant, Symptomatic, Docetaxel Eligible Current radiographic evidence of distant metastasis<= Yes Histology: Adenocarcinoma AJCC T Category: cTX Gleason Primary: X AJCC N Category: NX Gleason Secondary: X AJCC M Category: M1c Gleason Score: X AJCC 8 Stage Grouping: IVB PSA Values (ng/mL): X  Intent of Therapy: Non-Curative / Palliative Intent, Discussed with Patient

## 2017-11-02 NOTE — Telephone Encounter (Signed)
Spoke with patient re appointments for 4/10, 4/11 and 4/12.

## 2017-11-05 LAB — CULTURE, BLOOD (ROUTINE X 2)
Culture: NO GROWTH
Culture: NO GROWTH
Special Requests: ADEQUATE
Special Requests: ADEQUATE

## 2017-11-10 ENCOUNTER — Other Ambulatory Visit: Payer: Self-pay | Admitting: Oncology

## 2017-11-11 ENCOUNTER — Telehealth: Payer: Self-pay | Admitting: Oncology

## 2017-11-11 ENCOUNTER — Inpatient Hospital Stay: Payer: 59 | Attending: Oncology | Admitting: Oncology

## 2017-11-11 ENCOUNTER — Inpatient Hospital Stay: Payer: 59

## 2017-11-11 VITALS — BP 156/84 | HR 58 | Temp 98.0°F | Resp 18 | Ht 70.0 in | Wt 246.5 lb

## 2017-11-11 DIAGNOSIS — C7951 Secondary malignant neoplasm of bone: Secondary | ICD-10-CM | POA: Diagnosis not present

## 2017-11-11 DIAGNOSIS — C61 Malignant neoplasm of prostate: Secondary | ICD-10-CM | POA: Diagnosis not present

## 2017-11-11 DIAGNOSIS — Z5111 Encounter for antineoplastic chemotherapy: Secondary | ICD-10-CM

## 2017-11-11 DIAGNOSIS — Z7189 Other specified counseling: Secondary | ICD-10-CM

## 2017-11-11 DIAGNOSIS — Z192 Hormone resistant malignancy status: Secondary | ICD-10-CM | POA: Insufficient documentation

## 2017-11-11 DIAGNOSIS — Z923 Personal history of irradiation: Secondary | ICD-10-CM

## 2017-11-11 DIAGNOSIS — Z79899 Other long term (current) drug therapy: Secondary | ICD-10-CM | POA: Diagnosis not present

## 2017-11-11 LAB — CMP (CANCER CENTER ONLY)
ALT: 30 U/L (ref 0–55)
AST: 18 U/L (ref 5–34)
Albumin: 3.8 g/dL (ref 3.5–5.0)
Alkaline Phosphatase: 126 U/L (ref 40–150)
Anion gap: 12 — ABNORMAL HIGH (ref 3–11)
BUN: 19 mg/dL (ref 7–26)
CO2: 20 mmol/L — ABNORMAL LOW (ref 22–29)
Calcium: 9.9 mg/dL (ref 8.4–10.4)
Chloride: 105 mmol/L (ref 98–109)
Creatinine: 0.88 mg/dL (ref 0.70–1.30)
GFR, Est AFR Am: >60 mL/min (ref >60)
GFR, Estimated: >60 mL/min (ref >60)
Glucose, Bld: 129 mg/dL (ref 70–140)
Potassium: 4.5 mmol/L (ref 3.5–5.1)
Sodium: 137 mmol/L (ref 136–145)
Total Bilirubin: 0.4 mg/dL (ref 0.2–1.2)
Total Protein: 7.7 g/dL (ref 6.4–8.3)

## 2017-11-11 LAB — CBC WITH DIFFERENTIAL (CANCER CENTER ONLY)
Basophils Absolute: 0.1 10*3/uL (ref 0.0–0.1)
Basophils Relative: 1 %
Eosinophils Absolute: 0 10*3/uL (ref 0.0–0.5)
Eosinophils Relative: 0 %
HCT: 39 % (ref 38.4–49.9)
Hemoglobin: 13.3 g/dL (ref 13.0–17.1)
Lymphocytes Relative: 6 %
Lymphs Abs: 0.6 10*3/uL — ABNORMAL LOW (ref 0.9–3.3)
MCH: 31.1 pg (ref 27.2–33.4)
MCHC: 34 g/dL (ref 32.0–36.0)
MCV: 91.5 fL (ref 79.3–98.0)
Monocytes Absolute: 0.2 10*3/uL (ref 0.1–0.9)
Monocytes Relative: 2 %
Neutro Abs: 10 10*3/uL — ABNORMAL HIGH (ref 1.5–6.5)
Neutrophils Relative %: 91 %
Platelet Count: 281 10*3/uL (ref 140–400)
RBC: 4.27 MIL/uL (ref 4.20–5.82)
RDW: 13.3 % (ref 11.0–14.6)
WBC Count: 10.9 10*3/uL — ABNORMAL HIGH (ref 4.0–10.3)

## 2017-11-11 NOTE — Telephone Encounter (Signed)
Scheduled appt per 4/10 los - gave patient AVs and calender per los.

## 2017-11-11 NOTE — Progress Notes (Signed)
Hematology and Oncology Follow Up Visit  Alejandro [name] [id number] 1955/11/10 62 y.o. 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  He was  diagnosed in July of 2015. He had a PSA of 6.4 and a Gleason score 4+4 equals 8 high volume disease.    Prior Therapy:   Status post biopsy done on July of 2015.  He is status post lymph node dissection completed in 2015 at [facility].  He was treated there with definitive radiation therapy utilizing seed implant as well as external beam radiation completed in 2016.  He developed advanced disease with bone involvement.  He received radiation therapy to the pelvis in 2017.  Xtandi 160 mg daily that was poorly tolerated and was discontinued.  He was switched to [name] which she took until 2019 and developed progression of disease with bony metastasis as well as PSA of 80.   Current therapy: Taxotere chemotherapy cycle 1 given in March 2019.  He is here for evaluation for cycle 2 of therapy.  Interim History:  Alejandro [name] is here for a follow-up visit.  Since the last visit, he has been receiving care at [facility] in [place] which included definitive therapy with radiation and androgen deprivation therapy.  He developed castration resistant disease in 2017 and progressed on Zytiga.  He received the first cycle of chemotherapy in March 2019 and would like to transfer his chemotherapy care locally.  He tolerated the first cycle reasonably well except for mild fatigue but did develop a neutropenic fever and was hospitalized briefly at the end of March 2019 and discharged on November 02, 2017.  His blood culture and urine culture did not show any organism.His chest x-ray showed no active pneumonia.  He fully recovered at this time.    He has not reported any headaches or vision or double vision. Has not reported  any syncope. He does not report any fevers chills or sweats. He does report some mild fatigue. It is not pleuritic chest pain palpitation or orthopnea. Does not report any nausea vomiting or abdominal pain. Does not report any hematochezia or melena. He did not report any skeletal complaints of arthralgias or myalgias. He does not report any lymphadenopathy or petechiae. Does not report any clotting or thrombosis. Since his review of systems is negative.    Medications: I have reviewed the patient's current medications.  Current Outpatient Medications  Medication Sig Dispense Refill  . Alirocumab (PRALUENT [place]) Inject 75 mg into the skin every 14 (fourteen) days.     . ALPRAZolam (XANAX) 0.5 MG tablet Take 0.5 mg by mouth at bedtime as needed for sleep.     Marland Kitchen amLODipine (NORVASC) 10 MG tablet Take 10 mg by mouth daily.    Marland Kitchen aspirin 325 MG EC tablet Take 325 mg by mouth daily.    Marland Kitchen BENICAR 40 MG tablet Take 1 tablet by mouth daily.    . butalbital-acetaminophen-caffeine (FIORICET, ESGIC) 50-325-40 MG tablet Take 1-2 tablets by mouth every 6 (six) hours as needed for headache. 20 tablet 0  . BYSTOLIC 10 MG tablet Take 10 mg by mouth every evening.   4  . dexamethasone (DECADRON) 4 MG tablet Take 8 mg by mouth 2 (two) times daily. Take 2 tablets BID for 3 Days before chemo, the day of chemo and the day after chemo    . ezetimibe (ZETIA) 10 MG tablet  Take 10 mg by mouth daily.    . magic mouthwash w/lidocaine SOLN Take 5 mLs by mouth 4 (four) times daily as needed for mouth pain. 100 mL 0  . Multiple Vitamin (MULTIVITAMIN) tablet Take 1 tablet by mouth daily.    . ondansetron (ZOFRAN) 8 MG tablet Take 8 mg by mouth every 8 (eight) hours as needed for nausea or vomiting.     . predniSONE (DELTASONE) 5 MG tablet Take 5 mg by mouth 2 (two) times daily with a meal.     . tamsulosin (FLOMAX) 0.4 MG CAPS capsule Take 0.4 mg by mouth 2 (two) times daily.     Marland Kitchen VIAGRA 100 MG tablet Take 1 tablet by mouth as  needed for erectile dysfunction.      No current facility-administered medications for this visit.      Allergies:  Allergies  Allergen Reactions  . Codeine Nausea Only    Past Medical History, Surgical history, Social history, and Family History were reviewed and updated.   Physical Exam: Blood pressure (!) 156/84, pulse (!) 58, temperature 98 F (36.7 C), temperature source Oral, resp. rate 18, height 5\' 10"  (1.778 m), weight 246 lb 8 oz (111.8 kg), SpO2 98 %. ECOG: 0 General appearance: Well-appearing gentleman without distress. Head: Atraumatic without abnormalities. Eyes: No scleral icterus. Oropharynx: Without any thrush or ulcers. Lymph nodes: Cervical, supraclavicular, and axillary nodes normal. Heart: Regular rate and rhythm without any murmurs or gallops. Lung: Cclear, no wheezing, rales.  No dullness to percussion. Abdomin: Soft, nontender without any shifting dullness or ascites. Musculoskeletal: No joint deformity or effusion. Skin: No rashes or lesions. Neurological: No motor or sensory deficits.   Lab Results: Lab Results  Component Value Date   WBC 10.9 (H) 11/11/2017   HGB 12.6 (L) 11/02/2017   HCT 39.0 11/11/2017   MCV 91.5 11/11/2017   PLT 281 11/11/2017     Chemistry      Component Value Date/Time   NA 137 11/11/2017 1038   NA 135 (L) 06/13/2014 0944   K 4.5 11/11/2017 1038   K 5.4 (H) 06/13/2014 0944   CL 105 11/11/2017 1038   CO2 20 (L) 11/11/2017 1038   CO2 25 06/13/2014 0944   BUN 19 11/11/2017 1038   BUN 18.8 06/13/2014 0944   CREATININE 0.88 11/11/2017 1038   CREATININE 1.0 06/13/2014 0944      Component Value Date/Time   CALCIUM 9.9 11/11/2017 1038   CALCIUM 10.0 06/13/2014 0944   ALKPHOS 126 11/11/2017 1038   ALKPHOS 96 06/13/2014 0944   AST 18 11/11/2017 1038   AST 104 (H) 06/13/2014 0944   ALT 30 11/11/2017 1038   ALT 126 (H) 06/13/2014 0944   BILITOT 0.4 11/11/2017 1038   BILITOT 0.63 06/13/2014 0944        Impression and Plan:  62 year old man with the following issues:  1.Castration resistant metastatic prostate cancer with disease to the bone.  He was initially diagnosed in July of 2015 with a PSA of 6.4 and Gleason score 4+4 equals 8.   He is status post multiple therapies outlined above and currently receiving Taxotere chemotherapy.  He received the first cycle that is complicated by neutropenic fever.  Risks and benefits of continuing systemic chemotherapy was discussed today.  Complications include neutropenia, neutropenic sepsis, peripheral neuropathy, fatigue among others were reviewed.  He is agreeable to proceed.  The plan is to complete 10 cycles of therapy if he can tolerate it.  2.  IV  access: Risks and benefits of Port-A-Cath insertion was reviewed today.  For the time being he would like to defer that option and use peripheral veins.  He understands if his peripheral veins are not sufficient in the future, he will require a Port-A-Cath insertion.   3.  Antiemetics: Prescription for Zofran is available to him.  4.  Neutropenia prophylaxis: Risks and benefits of growth factor support was reviewed today.  He is agreeable to receive it and complications that include arthralgias, myalgias and injection related issues were reviewed.  Given his recent episode of neutropenic fever, I recommend to give this injection after each cycle of therapy.  5.  Androgen depravation: I recommended continuing his androgen deprivation therapy long-term.  Future injections will be arranged here based on his wishes.  6.  Bone directed therapy: He will be a good candidate for Xgeva after obtaining dental clearance.  He will require calcium and vitamin D supplements before the start of therapy.  I will continue to address with him this issue in the future.  7. Follow-up: We will be in 3 weeks for the next cycle of Taxotere chemotherapy.  25  minutes was spent with the patient face-to-face today.  More  than 50% of time was dedicated to patient counseling, education and coordination of his multifaceted care.       Zola Button, MD 4/10/201911:26 AM

## 2017-11-12 ENCOUNTER — Inpatient Hospital Stay: Payer: 59

## 2017-11-12 VITALS — BP 148/85 | HR 76 | Temp 98.5°F | Resp 16

## 2017-11-12 DIAGNOSIS — C61 Malignant neoplasm of prostate: Secondary | ICD-10-CM

## 2017-11-12 LAB — PROSTATE-SPECIFIC AG, SERUM (LABCORP): Prostate Specific Ag, Serum: 48.7 ng/mL — ABNORMAL HIGH (ref 0.0–4.0)

## 2017-11-12 MED ORDER — DEXAMETHASONE SODIUM PHOSPHATE 10 MG/ML IJ SOLN
10.0000 mg | Freq: Once | INTRAMUSCULAR | Status: AC
  Administered 2017-11-12: 10 mg via INTRAVENOUS

## 2017-11-12 MED ORDER — DEXAMETHASONE SODIUM PHOSPHATE 10 MG/ML IJ SOLN
INTRAMUSCULAR | Status: AC
  Filled 2017-11-12: qty 1

## 2017-11-12 MED ORDER — SODIUM CHLORIDE 0.9 % IV SOLN
75.0000 mg/m2 | Freq: Once | INTRAVENOUS | Status: AC
  Administered 2017-11-12: 170 mg via INTRAVENOUS
  Filled 2017-11-12: qty 17

## 2017-11-12 MED ORDER — SODIUM CHLORIDE 0.9 % IV SOLN
Freq: Once | INTRAVENOUS | Status: AC
  Administered 2017-11-12: 08:00:00 via INTRAVENOUS

## 2017-11-12 NOTE — Patient Instructions (Addendum)
[facility] Discharge Instructions for Patients Receiving Chemotherapy  Today you received the following chemotherapy agents Taxotere  To help prevent nausea and vomiting after your treatment, we encourage you to take your nausea medication as directed   If you develop nausea and vomiting that is not controlled by your nausea medication, call the clinic.   BELOW ARE SYMPTOMS THAT SHOULD BE REPORTED IMMEDIATELY:  *FEVER GREATER THAN 100.5 F  *CHILLS WITH OR WITHOUT FEVER  NAUSEA AND VOMITING THAT IS NOT CONTROLLED WITH YOUR NAUSEA MEDICATION  *UNUSUAL SHORTNESS OF BREATH  *UNUSUAL BRUISING OR BLEEDING  TENDERNESS IN MOUTH AND THROAT WITH OR WITHOUT PRESENCE OF ULCERS  *URINARY PROBLEMS  *BOWEL PROBLEMS  UNUSUAL RASH Items with * indicate a potential emergency and should be followed up as soon as possible.  Feel free to call the clinic should you have any questions or concerns. The clinic phone number is [phone].  Please show the CHEMO ALERT CARD at check-in to the Emergency Department and triage nurse.   Docetaxel injection What is this medicine? DOCETAXEL (doe se TAX el) is a chemotherapy drug. It targets fast dividing cells, like cancer cells, and causes these cells to die. This medicine is used to treat many types of cancers like breast cancer, certain stomach cancers, head and neck cancer, lung cancer, and prostate cancer. This medicine may be used for other purposes; ask your health care provider or pharmacist if you have questions. COMMON BRAND NAME(S): Docefrez, Taxotere What should I tell my health care provider before I take this medicine? They need to know if you have any of these conditions: -infection (especially a virus infection such as chickenpox, cold sores, or herpes) -liver disease -low blood counts, like low white cell, platelet, or red cell counts -an unusual or allergic reaction to docetaxel, polysorbate 80, other chemotherapy  agents, other medicines, foods, dyes, or preservatives -pregnant or trying to get pregnant -breast-feeding How should I use this medicine? This drug is given as an infusion into a vein. It is administered in a hospital or clinic by a specially trained health care professional. Talk to your pediatrician regarding the use of this medicine in children. Special care may be needed. Overdosage: If you think you have taken too much of this medicine contact a poison control center or emergency room at once. NOTE: This medicine is only for you. Do not share this medicine with others. What if I miss a dose? It is important not to miss your dose. Call your doctor or health care professional if you are unable to keep an appointment. What may interact with this medicine? -cyclosporine -erythromycin -ketoconazole -medicines to increase blood counts like filgrastim, pegfilgrastim, sargramostim -vaccines Talk to your doctor or health care professional before taking any of these medicines: -acetaminophen -aspirin -ibuprofen -ketoprofen -naproxen This list may not describe all possible interactions. Give your health care provider a list of all the medicines, herbs, non-prescription drugs, or dietary supplements you use. Also tell them if you smoke, drink alcohol, or use illegal drugs. Some items may interact with your medicine. What should I watch for while using this medicine? Your condition will be monitored carefully while you are receiving this medicine. You will need important blood work done while you are taking this medicine. This drug may make you feel generally unwell. This is not uncommon, as chemotherapy can affect healthy cells as well as cancer cells. Report any side effects. Continue your course of treatment even though you feel ill   unless your doctor tells you to stop. In some cases, you may be given additional medicines to help with side effects. Follow all directions for their use. Call  your doctor or health care professional for advice if you get a fever, chills or sore throat, or other symptoms of a cold or flu. Do not treat yourself. This drug decreases your body's ability to fight infections. Try to avoid being around people who are sick. This medicine may increase your risk to bruise or bleed. Call your doctor or health care professional if you notice any unusual bleeding. This medicine may contain alcohol in the product. You may get drowsy or dizzy. Do not drive, use machinery, or do anything that needs mental alertness until you know how this medicine affects you. Do not stand or sit up quickly, especially if you are an older patient. This reduces the risk of dizzy or fainting spells. Avoid alcoholic drinks. Do not become pregnant while taking this medicine. Women should inform their doctor if they wish to become pregnant or think they might be pregnant. There is a potential for serious side effects to an unborn child. Talk to your health care professional or pharmacist for more information. Do not breast-feed an infant while taking this medicine. What side effects may I notice from receiving this medicine? Side effects that you should report to your doctor or health care professional as soon as possible: -allergic reactions like skin rash, itching or hives, swelling of the face, lips, or tongue -low blood counts - This drug may decrease the number of white blood cells, red blood cells and platelets. You may be at increased risk for infections and bleeding. -signs of infection - fever or chills, cough, sore throat, pain or difficulty passing urine -signs of decreased platelets or bleeding - bruising, pinpoint red spots on the skin, black, tarry stools, nosebleeds -signs of decreased red blood cells - unusually weak or tired, fainting spells, lightheadedness -breathing problems -fast or irregular heartbeat -low blood pressure -mouth sores -nausea and vomiting -pain, swelling,  redness or irritation at the injection site -pain, tingling, numbness in the hands or feet -swelling of the ankle, feet, hands -weight gain Side effects that usually do not require medical attention (report to your doctor or health care professional if they continue or are bothersome): -bone pain -complete hair loss including hair on your head, underarms, pubic hair, eyebrows, and eyelashes -diarrhea -excessive tearing -changes in the color of fingernails -loosening of the fingernails -nausea -muscle pain -red flush to skin -sweating -weak or tired This list may not describe all possible side effects. Call your doctor for medical advice about side effects. You may report side effects to FDA at 1-800-FDA-1088. Where should I keep my medicine? This drug is given in a hospital or clinic and will not be stored at home. NOTE: This sheet is a summary. It may not cover all possible information. If you have questions about this medicine, talk to your doctor, pharmacist, or health care provider.  2018 Elsevier/Gold Standard (2015-08-23 12:32:56)  

## 2017-11-12 NOTE — Progress Notes (Signed)
Patient with prior infusion of Taxotere in [place].  Per patient tolerated well, no reactions during infusion.  No side effects per patient.  Informed Genna in pharmacy, [place] to administer infusion as second time administration per [place].  Will monitor patient.

## 2017-11-13 ENCOUNTER — Inpatient Hospital Stay: Payer: 59

## 2017-11-13 VITALS — BP 143/88 | HR 72 | Temp 98.2°F | Resp 18

## 2017-11-13 DIAGNOSIS — C61 Malignant neoplasm of prostate: Secondary | ICD-10-CM

## 2017-11-13 MED ORDER — PEGFILGRASTIM-CBQV 6 MG/0.6ML ~~LOC~~ SOSY
PREFILLED_SYRINGE | SUBCUTANEOUS | Status: AC
  Filled 2017-11-13: qty 0.6

## 2017-11-13 MED ORDER — PEGFILGRASTIM-CBQV 6 MG/0.6ML ~~LOC~~ SOSY
6.0000 mg | PREFILLED_SYRINGE | Freq: Once | SUBCUTANEOUS | Status: AC
  Administered 2017-11-13: 6 mg via SUBCUTANEOUS

## 2017-11-13 NOTE — Patient Instructions (Signed)
Pegfilgrastim injection What is this medicine? PEGFILGRASTIM (PEG fil gra stim) is a long-acting granulocyte colony-stimulating factor that stimulates the growth of neutrophils, a type of white blood cell important in the body's fight against infection. It is used to reduce the incidence of fever and infection in patients with certain types of cancer who are receiving chemotherapy that affects the bone marrow, and to increase survival after being exposed to high doses of radiation. This medicine may be used for other purposes; ask your health care provider or pharmacist if you have questions. COMMON BRAND NAME(S): Neulasta What should I tell my health care provider before I take this medicine? They need to know if you have any of these conditions: -kidney disease -latex allergy -ongoing radiation therapy -sickle cell disease -skin reactions to acrylic adhesives (On-Body Injector only) -an unusual or allergic reaction to pegfilgrastim, filgrastim, other medicines, foods, dyes, or preservatives -pregnant or trying to get pregnant -breast-feeding How should I use this medicine? This medicine is for injection under the skin. If you get this medicine at home, you will be taught how to prepare and give the pre-filled syringe or how to use the On-body Injector. Refer to the patient Instructions for Use for detailed instructions. Use exactly as directed. Tell your healthcare provider immediately if you suspect that the On-body Injector may not have performed as intended or if you suspect the use of the On-body Injector resulted in a missed or partial dose. It is important that you put your used needles and syringes in a special sharps container. Do not put them in a trash can. If you do not have a sharps container, call your pharmacist or healthcare provider to get one. Talk to your pediatrician regarding the use of this medicine in children. While this drug may be prescribed for selected conditions,  precautions do apply. Overdosage: If you think you have taken too much of this medicine contact a poison control center or emergency room at once. NOTE: This medicine is only for you. Do not share this medicine with others. What if I miss a dose? It is important not to miss your dose. Call your doctor or health care professional if you miss your dose. If you miss a dose due to an On-body Injector failure or leakage, a new dose should be administered as soon as possible using a single prefilled syringe for manual use. What may interact with this medicine? Interactions have not been studied. Give your health care provider a list of all the medicines, herbs, non-prescription drugs, or dietary supplements you use. Also tell them if you smoke, drink alcohol, or use illegal drugs. Some items may interact with your medicine. This list may not describe all possible interactions. Give your health care provider a list of all the medicines, herbs, non-prescription drugs, or dietary supplements you use. Also tell them if you smoke, drink alcohol, or use illegal drugs. Some items may interact with your medicine. What should I watch for while using this medicine? You may need blood work done while you are taking this medicine. If you are going to need a MRI, CT scan, or other procedure, tell your doctor that you are using this medicine (On-Body Injector only). What side effects may I notice from receiving this medicine? Side effects that you should report to your doctor or health care professional as soon as possible: -allergic reactions like skin rash, itching or hives, swelling of the face, lips, or tongue -dizziness -fever -pain, redness, or irritation at site   where injected -pinpoint red spots on the skin -red or dark-brown urine -shortness of breath or breathing problems -stomach or side pain, or pain at the shoulder -swelling -tiredness -trouble passing urine or change in the amount of urine Side  effects that usually do not require medical attention (report to your doctor or health care professional if they continue or are bothersome): -bone pain -muscle pain This list may not describe all possible side effects. Call your doctor for medical advice about side effects. You may report side effects to FDA at 1-800-FDA-1088. Where should I keep my medicine? Keep out of the reach of children. Store pre-filled syringes in a refrigerator between 2 and 8 degrees C (36 and 46 degrees F). Do not freeze. Keep in carton to protect from light. Throw away this medicine if it is left out of the refrigerator for more than 48 hours. Throw away any unused medicine after the expiration date. NOTE: This sheet is a summary. It may not cover all possible information. If you have questions about this medicine, talk to your doctor, pharmacist, or health care provider.  2018 Elsevier/Gold Standard (2016-07-17 12:58:03)  

## 2017-11-17 ENCOUNTER — Other Ambulatory Visit: Payer: Self-pay | Admitting: *Deleted

## 2017-11-17 MED ORDER — MAGIC MOUTHWASH W/LIDOCAINE: 5.0000 mL | Freq: Four times a day (QID) | ORAL | 1 refills | Status: DC | PRN

## 2017-11-17 NOTE — Addendum Note (Signed)
Addended by: Wyatt Portela on: 11/17/2017 04:28 PM   Modules accepted: Orders

## 2017-11-18 ENCOUNTER — Emergency Department (HOSPITAL_COMMUNITY): Payer: 59

## 2017-11-18 ENCOUNTER — Other Ambulatory Visit: Payer: Self-pay

## 2017-11-18 ENCOUNTER — Encounter (HOSPITAL_COMMUNITY): Payer: Self-pay | Admitting: Nurse Practitioner

## 2017-11-18 ENCOUNTER — Observation Stay (HOSPITAL_COMMUNITY)
Admission: EM | Admit: 2017-11-18 | Discharge: 2017-11-20 | Disposition: A | Discharge status: Home / Self Care | Payer: 59 | Attending: Internal Medicine | Admitting: Internal Medicine

## 2017-11-18 DIAGNOSIS — Z66 Do not resuscitate: Secondary | ICD-10-CM | POA: Insufficient documentation

## 2017-11-18 DIAGNOSIS — I10 Essential (primary) hypertension: Secondary | ICD-10-CM | POA: Insufficient documentation

## 2017-11-18 DIAGNOSIS — E872 Acidosis, unspecified: Secondary | ICD-10-CM

## 2017-11-18 DIAGNOSIS — C61 Malignant neoplasm of prostate: Secondary | ICD-10-CM | POA: Diagnosis not present

## 2017-11-18 DIAGNOSIS — Z87891 Personal history of nicotine dependence: Secondary | ICD-10-CM | POA: Insufficient documentation

## 2017-11-18 DIAGNOSIS — Z79899 Other long term (current) drug therapy: Secondary | ICD-10-CM | POA: Insufficient documentation

## 2017-11-18 DIAGNOSIS — Z7982 Long term (current) use of aspirin: Secondary | ICD-10-CM | POA: Diagnosis not present

## 2017-11-18 DIAGNOSIS — R509 Fever, unspecified: Secondary | ICD-10-CM | POA: Diagnosis not present

## 2017-11-18 DIAGNOSIS — Z7952 Long term (current) use of systemic steroids: Secondary | ICD-10-CM | POA: Diagnosis not present

## 2017-11-18 DIAGNOSIS — I700 Atherosclerosis of aorta: Secondary | ICD-10-CM | POA: Diagnosis not present

## 2017-11-18 DIAGNOSIS — K1231 Oral mucositis (ulcerative) due to antineoplastic therapy: Secondary | ICD-10-CM | POA: Diagnosis present

## 2017-11-18 DIAGNOSIS — Z885 Allergy status to narcotic agent status: Secondary | ICD-10-CM | POA: Diagnosis not present

## 2017-11-18 DIAGNOSIS — R51 Headache: Secondary | ICD-10-CM

## 2017-11-18 DIAGNOSIS — Z923 Personal history of irradiation: Secondary | ICD-10-CM | POA: Insufficient documentation

## 2017-11-18 DIAGNOSIS — Z9989 Dependence on other enabling machines and devices: Secondary | ICD-10-CM | POA: Diagnosis not present

## 2017-11-18 DIAGNOSIS — C7951 Secondary malignant neoplasm of bone: Secondary | ICD-10-CM | POA: Insufficient documentation

## 2017-11-18 DIAGNOSIS — G4733 Obstructive sleep apnea (adult) (pediatric): Secondary | ICD-10-CM | POA: Diagnosis not present

## 2017-11-18 DIAGNOSIS — E785 Hyperlipidemia, unspecified: Secondary | ICD-10-CM | POA: Diagnosis not present

## 2017-11-18 DIAGNOSIS — E7800 Pure hypercholesterolemia, unspecified: Secondary | ICD-10-CM | POA: Diagnosis not present

## 2017-11-18 DIAGNOSIS — I2510 Atherosclerotic heart disease of native coronary artery without angina pectoris: Secondary | ICD-10-CM | POA: Diagnosis not present

## 2017-11-18 DIAGNOSIS — F419 Anxiety disorder, unspecified: Secondary | ICD-10-CM | POA: Diagnosis not present

## 2017-11-18 DIAGNOSIS — I350 Nonrheumatic aortic (valve) stenosis: Secondary | ICD-10-CM | POA: Insufficient documentation

## 2017-11-18 DIAGNOSIS — R519 Headache, unspecified: Secondary | ICD-10-CM

## 2017-11-18 LAB — URINALYSIS, ROUTINE W REFLEX MICROSCOPIC
Bilirubin Urine: NEGATIVE
Glucose, UA: NEGATIVE mg/dL
Hgb urine dipstick: NEGATIVE
Ketones, ur: NEGATIVE mg/dL
Leukocytes, UA: NEGATIVE
Nitrite: NEGATIVE
Protein, ur: NEGATIVE mg/dL
Specific Gravity, Urine: 1.013 (ref 1.005–1.030)
pH: 6 (ref 5.0–8.0)

## 2017-11-18 LAB — CBC WITH DIFFERENTIAL/PLATELET
Basophils Absolute: 0.1 10*3/uL (ref 0.0–0.1)
Basophils Relative: 1 %
Eosinophils Absolute: 0.1 10*3/uL (ref 0.0–0.7)
Eosinophils Relative: 1 %
HCT: 37.7 % — ABNORMAL LOW (ref 39.0–52.0)
Hemoglobin: 12.4 g/dL — ABNORMAL LOW (ref 13.0–17.0)
Lymphocytes Relative: 19 %
Lymphs Abs: 1.6 10*3/uL (ref 0.7–4.0)
MCH: 30.7 pg (ref 26.0–34.0)
MCHC: 32.9 g/dL (ref 30.0–36.0)
MCV: 93.3 fL (ref 78.0–100.0)
Monocytes Absolute: 1.6 10*3/uL — ABNORMAL HIGH (ref 0.1–1.0)
Monocytes Relative: 20 %
Neutro Abs: 4.8 10*3/uL (ref 1.7–7.7)
Neutrophils Relative %: 59 %
Platelets: 193 10*3/uL (ref 150–400)
RBC: 4.04 MIL/uL — ABNORMAL LOW (ref 4.22–5.81)
RDW: 13.3 % (ref 11.5–15.5)
WBC: 8.2 10*3/uL (ref 4.0–10.5)

## 2017-11-18 LAB — COMPREHENSIVE METABOLIC PANEL
ALT: 27 U/L (ref 17–63)
AST: 19 U/L (ref 15–41)
Albumin: 3.7 g/dL (ref 3.5–5.0)
Alkaline Phosphatase: 106 U/L (ref 38–126)
Anion gap: 10 (ref 5–15)
BUN: 12 mg/dL (ref 6–20)
CO2: 26 mmol/L (ref 22–32)
Calcium: 9.1 mg/dL (ref 8.9–10.3)
Chloride: 102 mmol/L (ref 101–111)
Creatinine, Ser: 0.82 mg/dL (ref 0.61–1.24)
GFR calc Af Amer: >60 mL/min (ref >60)
GFR calc non Af Amer: >60 mL/min (ref >60)
Glucose, Bld: 145 mg/dL — ABNORMAL HIGH (ref 65–99)
Potassium: 3.6 mmol/L (ref 3.5–5.1)
Sodium: 138 mmol/L (ref 135–145)
Total Bilirubin: 0.6 mg/dL (ref 0.3–1.2)
Total Protein: 7 g/dL (ref 6.5–8.1)

## 2017-11-18 LAB — I-STAT CG4 LACTIC ACID, ED
Lactic Acid, Venous: 2.24 mmol/L (ref 0.5–1.9)
Lactic Acid, Venous: 3.68 mmol/L (ref 0.5–1.9)

## 2017-11-18 MED ORDER — ACETAMINOPHEN 500 MG PO TABS
1000.0000 mg | ORAL_TABLET | Freq: Once | ORAL | Status: AC
  Administered 2017-11-18: 1000 mg via ORAL
  Filled 2017-11-18: qty 2

## 2017-11-18 MED ORDER — CEFEPIME HCL 2 G IJ SOLR
2.0000 g | Freq: Three times a day (TID) | INTRAMUSCULAR | Status: DC
Start: 2017-11-18 — End: 2017-11-19
  Administered 2017-11-18: 2 g via INTRAVENOUS
  Filled 2017-11-18 (×2): qty 2

## 2017-11-18 MED ORDER — SODIUM CHLORIDE 0.9 % IV BOLUS
1000.0000 mL | Freq: Once | INTRAVENOUS | Status: AC
  Administered 2017-11-18: 1000 mL via INTRAVENOUS

## 2017-11-18 NOTE — ED Triage Notes (Signed)
Patient brought in to ER from home for a fever of 102 this afternoon. 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 Patient states he has had pain all over. Patient is receiving active chemo last treatment was last Thursday.

## 2017-11-18 NOTE — ED Provider Notes (Signed)
[facility] DEPT Provider Note   CSN: [id number] Arrival date & time: 11/18/17  1747     History   Chief Complaint No chief complaint on file.   HPI Alejandro [name]. is a 62 y.o. male.  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  Patient reports that last dose of docetaxel was 1 week ago, and he had a Pelfilgrastim shot 5 days ago.  This was patient's first Pelfilgrastim shot. Patient reports he began having diffuse myalgias, rigors, and fatigue worsening over past 48 hours.  Patient reports that symptoms began today.  Patient denies any associated coughing, shortness of breath, chest pain, abdominal pain, nausea, vomiting, diarrhea, dysuria, rashes.  Patient reports that this feels similar to his prior episode of febrile neutropenia.  Patient  reports that he has a headache, frontal in nature, not associated with visual changes.  Patient reports that it is similar to his prior headaches, and no other features associated with it such as vertigo, weakness or numbness in distal extremities, vomiting.  Patient has been taking Fioricet, but with minimal relief.  Patient also noting that he has had soreness in his mouth, but this has occurred since starting his chemotherapy treatments.  Past Medical History:  Diagnosis Date  . ANXIETY   . AORTIC STENOSIS, MILD   . CAD   . CHEST PAIN-UNSPECIFIED   . DYSPNEA   . HYPERCHOLESTEROLEMIA   . Hypertension   . INSOMNIA   . OSA on CPAP 05/31/2013  . OSTEOARTHRITIS, KNEE   . Prostate cancer [facility])     Patient Active Problem List   Diagnosis Date Noted  . Neutropenic fever ([place]) 10/30/2017  . 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 OSA on CPAP 05/31/2013  . DYSPNEA 01/23/2010  . CHEST  PAIN-UNSPECIFIED 01/23/2010  . ANXIETY 12/10/2009  . OSTEOARTHRITIS, KNEE 12/10/2009  . INSOMNIA 12/10/2009  . HYPERCHOLESTEROLEMIA 12/07/2009  . Essential hypertension 12/07/2009  . CAD 12/07/2009  . AORTIC STENOSIS, MILD 12/07/2009    Past Surgical History:  Procedure Laterality Date  . COLONOSCOPY    . ELBOW SURGERY    . LUMBAR LAMINECTOMY  Late 1990's  . NEUROPLASTY / TRANSPOSITION MEDIAN NERVE AT CARPAL TUNNEL BILATERAL    . PROSTATE BIOPSY          Home Medications    Prior to Admission medications   Medication Sig Start Date End Date Taking? Authorizing Provider  Alirocumab (PRALUENT Clifton) Inject 75 mg into the skin every 14 (fourteen) days.    Yes [provider]  ALPRAZolam Duanne Moron) 0.5 MG tablet Take 0.5 mg by mouth at bedtime as needed for sleep.  09/13/17  Yes [provider]  amLODipine (NORVASC) 10 MG tablet Take 10 mg by mouth daily.   Yes [provider]  aspirin 325 MG EC tablet Take 325 mg by mouth daily.   Yes [provider]  BENICAR 40 MG tablet Take 1 tablet by mouth daily. 09/22/13  Yes [provider]  butalbital-acetaminophen-caffeine (FIORICET, ESGIC) 50-325-40 MG tablet Take 1-2 tablets by mouth every 6 (six) hours as needed for headache. 11/02/17 11/02/18 Yes Sheikh, Omair Latif, DO  BYSTOLIC 10 MG tablet Take 10 mg by mouth every evening.  06/06/14  Yes [provider]  dexamethasone (DECADRON) 4 MG tablet Take  8 mg by mouth 2 (two) times daily. Take 2 tablets BID for 3 Days before chemo, the day of chemo and the day after chemo 10/16/17  Yes [provider]  ezetimibe (ZETIA) 10 MG tablet Take 10 mg by mouth daily.   Yes [provider]  loratadine (CLARITIN) 10 MG tablet Take 10 mg by mouth daily.   Yes [provider]  magic mouthwash w/lidocaine SOLN Take 5 mLs by mouth 4 (four) times daily as needed for mouth pain. 11/17/17  Yes Wyatt Portela, MD  Multiple Vitamin  (MULTIVITAMIN) tablet Take 1 tablet by mouth daily.   Yes [provider]  ondansetron (ZOFRAN) 8 MG tablet Take 8 mg by mouth every 8 (eight) hours as needed for nausea or vomiting.  10/19/17  Yes [provider]  predniSONE (DELTASONE) 5 MG tablet Take 5 mg by mouth 2 (two) times daily with a meal.  09/17/17  Yes [provider]  tamsulosin (FLOMAX) 0.4 MG CAPS capsule Take 0.4 mg by mouth 2 (two) times daily.  03/17/14  Yes [provider]  VIAGRA 100 MG tablet Take 1 tablet by mouth as needed for erectile dysfunction.  10/08/13   [provider]    Family History Family History  Problem Relation Age of Onset  . Cancer Father        prostate    Social History Social History   Tobacco Use  . Smoking status: Former Smoker    Packs/day: 3.00    Years: 25.00    Pack years: 75.00    Types: Cigars, Cigarettes  . Smokeless tobacco: Never Used  . Tobacco comment: Former 3 ppd smoker for 30 years quit heavy smoking 15 years ago  Substance Use Topics  . Alcohol use: Yes    Alcohol/week: 6.0 oz    Types: 12 Standard drinks or equivalent per week    Comment: 2 drinks per day  . Drug use: No     Allergies   Codeine   Review of Systems Review of Systems  Constitutional: Positive for chills and fever. Negative for activity change and appetite change.  HENT: Positive for mouth sores. Negative for congestion and rhinorrhea.   Eyes: Negative for visual disturbance.  Respiratory: Negative for cough, chest tightness and shortness of breath.   Cardiovascular: Negative for chest pain.  Gastrointestinal: Negative for abdominal pain, diarrhea, nausea and vomiting.  Genitourinary: Negative for dysuria and flank pain.  Musculoskeletal: Positive for arthralgias and myalgias. Negative for neck pain and neck stiffness.  Skin: Negative for rash.  Neurological: Positive for weakness and headaches. Negative for seizures, speech difficulty and numbness.      Physical Exam Updated Vital Signs BP 129/77   Pulse 65   Temp 99.5 F (37.5 C) (Oral)   Resp 18   Ht 5\' 10"  (1.778 m)   Wt 111.1 kg (245 lb)   SpO2 97%   BMI 35.15 kg/m   Physical Exam  Constitutional: He appears well-developed and well-nourished. No distress.  HENT:  Head: Normocephalic and atraumatic.  Mouth/Throat: Oropharynx is clear and moist.  No obvious source of the oropharynx noted.  Eyes: Pupils are equal, round, and reactive to light. Conjunctivae and EOM are normal. No scleral icterus.  Neck: Normal range of motion. Neck supple.  No nuchal rigidity.  No meningismus.  Cardiovascular: Normal rate, regular rhythm, S1 normal, S2 normal and intact distal pulses.  No murmur heard. No lower extremity edema.  Pulmonary/Chest: Effort normal and breath  sounds normal. He has no wheezes. He has no rales.  Abdominal: Soft. He exhibits no distension. There is no tenderness. There is no guarding.  Musculoskeletal: Normal range of motion. He exhibits no edema or deformity.  Lymphadenopathy:    He has no cervical adenopathy.  Neurological: He is alert.  Mental Status:  Alert, oriented, thought content appropriate, able to give a coherent history. Speech fluent without evidence of aphasia. Able to follow 2 step commands without difficulty.  Cranial Nerves:  II-XII grossly intact.  Motor:  Normal tone. 5/5 in upper and lower extremities bilaterally including strong and equal grip strength and dorsiflexion/plantar flexion Cerebellar: normal finger-to-nose with bilateral upper extremities CV: distal pulses palpable throughout     Skin: Skin is warm and dry. No rash noted. No erythema.  Psychiatric: He has a normal mood and affect. His behavior is normal. Judgment and thought content normal.  Nursing note and vitals reviewed.    ED Treatments / Results  Labs (all labs ordered are listed, but only abnormal results are displayed) Labs Reviewed  CULTURE, BLOOD (ROUTINE X  2)  CULTURE, BLOOD (ROUTINE X 2)  URINE CULTURE  COMPREHENSIVE METABOLIC PANEL  CBC WITH DIFFERENTIAL/PLATELET  URINALYSIS, ROUTINE W REFLEX MICROSCOPIC  I-STAT CG4 LACTIC ACID, ED    EKG None  Radiology No results found.  Procedures Procedures (including critical care time)  Medications Ordered in ED Medications  ceFEPIme (MAXIPIME) 2 g in sodium chloride 0.9 % 100 mL IVPB (0 g Intravenous Stopped 11/18/17 2343)  sodium chloride 0.9 % bolus 1,000 mL (0 mLs Intravenous Stopped 11/18/17 2347)  acetaminophen (TYLENOL) tablet 1,000 mg (1,000 mg Oral Given 11/18/17 2342)     Initial Impression / Assessment and Plan / ED Course  I have reviewed the triage vital signs and the nursing notes.  Pertinent labs & imaging results that were available during my care of the patient were reviewed by me and considered in my medical decision making (see chart for details).  Clinical Course as of Nov 20 7  Wed Nov 18, 2017  2211 Lactic acid noted.  Patient is receiving fluid repletion, but does not meet other markers of sepsis at this time.  Since lactic is less than 4, will replete with 2 L of fluid.   [AM]  Thu Nov 19, 2017  0006 Spoke with Dr. Burr Medico of oncology, who stated that admission would be reasonable, even no no evidence of neutropenia.  Given how poorly the patient feels, reasonable to cover for febrile neutropenia.  Dr. Burr Medico recommends Zosyn.    [AM]    Clinical Course User Index [AM] Albesa Seen, PA-C    Patient nontoxic-appearing, afebrile on our examination.  Patient not meeting SIRS criteria at this time, as well as no source of infection.  Therefore, sepsis order set not initiated.  Reported history of fever of 102 in the setting of 1 week post docetaxel.  Patient has a history of febrile neutropenia.  While awaiting lab work, neutropenia order set initiated.  Initiated cefepime.  Acetaminophen for rigors and analgesia.  Doubt pneumonia based on pulmonary exam and chest  x-ray.  Doubt meningitis, as patient has no nuchal rigidity, meningismus, no neurologic signs or symptoms.  Initial lactic acid is 2.24.  Repeat lactic is 3.68.  In the interim, patient receiving 2 L of normal saline.  Unclear etiology, as patient has no other markers of sepsis.  WBC count 8.2.  Urinalysis is clear.  No abnormalities on CMP.  Cultures  pending.  Per discussion with Dr. Burr Medico of hem/onc, who recommends changing to Zosyn if patient is admitted.  Unclear etiology of fever, but infection is on differential. Other etiologies of fever, lactic acidosis considered including tumor fever, increased cell turnover.  Spoke with Dr. Roel Cluck, who will admit the patient.  This is a shared visit with Dr. Daleen Bo. Patient was independently evaluated by this attending physician. Attending physician consulted in evaluation and admission management.   Final Clinical Impressions(s) / ED Diagnoses   Final diagnoses:  Fever in adult  Frontal headache    ED Discharge Orders    None       Tamala Julian 11/19/17 0056    Daleen Bo, MD 11/19/17 [phone]

## 2017-11-18 NOTE — ED Notes (Signed)
I gave critical I stat CG4 result to PA Murray 

## 2017-11-18 NOTE — ED Notes (Signed)
I gave critical I Stat CG4 results to [place]

## 2017-11-18 NOTE — ED Provider Notes (Signed)
  Face-to-face evaluation   History: Patient here because he had a fever at home of 102.  He has had trouble sleeping for 2 days, feels generally achy, and has a headache.  He denies cough, shortness of breath, nausea or vomiting.  Physical exam: Alert, calm, cooperative.  Neck is supple.  No dysarthria or aphasia.  He is alert and cooperative.  Medical screening examination/treatment/procedure(s) were conducted as a shared visit with non-physician practitioner(s) and myself.  I personally evaluated the patient during the encounter    Daleen Bo, MD 11/19/17 2304

## 2017-11-19 ENCOUNTER — Encounter (HOSPITAL_COMMUNITY): Payer: Self-pay | Admitting: Internal Medicine

## 2017-11-19 ENCOUNTER — Other Ambulatory Visit: Payer: Self-pay

## 2017-11-19 DIAGNOSIS — C7951 Secondary malignant neoplasm of bone: Secondary | ICD-10-CM | POA: Diagnosis not present

## 2017-11-19 DIAGNOSIS — Z9989 Dependence on other enabling machines and devices: Secondary | ICD-10-CM | POA: Diagnosis not present

## 2017-11-19 DIAGNOSIS — C61 Malignant neoplasm of prostate: Secondary | ICD-10-CM | POA: Diagnosis not present

## 2017-11-19 DIAGNOSIS — R51 Headache: Secondary | ICD-10-CM

## 2017-11-19 DIAGNOSIS — E7800 Pure hypercholesterolemia, unspecified: Secondary | ICD-10-CM | POA: Diagnosis not present

## 2017-11-19 DIAGNOSIS — E872 Acidosis, unspecified: Secondary | ICD-10-CM

## 2017-11-19 DIAGNOSIS — R509 Fever, unspecified: Secondary | ICD-10-CM | POA: Diagnosis not present

## 2017-11-19 DIAGNOSIS — I10 Essential (primary) hypertension: Secondary | ICD-10-CM | POA: Diagnosis not present

## 2017-11-19 DIAGNOSIS — K1231 Oral mucositis (ulcerative) due to antineoplastic therapy: Secondary | ICD-10-CM | POA: Diagnosis not present

## 2017-11-19 DIAGNOSIS — I2510 Atherosclerotic heart disease of native coronary artery without angina pectoris: Secondary | ICD-10-CM

## 2017-11-19 DIAGNOSIS — G4733 Obstructive sleep apnea (adult) (pediatric): Secondary | ICD-10-CM | POA: Diagnosis not present

## 2017-11-19 LAB — COMPREHENSIVE METABOLIC PANEL
ALT: 23 U/L (ref 17–63)
AST: 16 U/L (ref 15–41)
Albumin: 3 g/dL — ABNORMAL LOW (ref 3.5–5.0)
Alkaline Phosphatase: 88 U/L (ref 38–126)
Anion gap: 11 (ref 5–15)
BUN: 11 mg/dL (ref 6–20)
CO2: 24 mmol/L (ref 22–32)
Calcium: 8.5 mg/dL — ABNORMAL LOW (ref 8.9–10.3)
Chloride: 107 mmol/L (ref 101–111)
Creatinine, Ser: 0.7 mg/dL (ref 0.61–1.24)
GFR calc Af Amer: >60 mL/min (ref >60)
GFR calc non Af Amer: >60 mL/min (ref >60)
Glucose, Bld: 107 mg/dL — ABNORMAL HIGH (ref 65–99)
Potassium: 3.8 mmol/L (ref 3.5–5.1)
Sodium: 142 mmol/L (ref 135–145)
Total Bilirubin: 0.7 mg/dL (ref 0.3–1.2)
Total Protein: 5.8 g/dL — ABNORMAL LOW (ref 6.5–8.1)

## 2017-11-19 LAB — RESPIRATORY PANEL BY PCR

## 2017-11-19 LAB — CBC
HCT: 33.3 % — ABNORMAL LOW (ref 39.0–52.0)
Hemoglobin: 11 g/dL — ABNORMAL LOW (ref 13.0–17.0)
MCH: 30.8 pg (ref 26.0–34.0)
MCHC: 33 g/dL (ref 30.0–36.0)
MCV: 93.3 fL (ref 78.0–100.0)
Platelets: 171 10*3/uL (ref 150–400)
RBC: 3.57 MIL/uL — ABNORMAL LOW (ref 4.22–5.81)
RDW: 13.2 % (ref 11.5–15.5)
WBC: 10.1 10*3/uL (ref 4.0–10.5)

## 2017-11-19 LAB — LACTIC ACID, PLASMA
Lactic Acid, Venous: 1.6 mmol/L (ref 0.5–1.9)
Lactic Acid, Venous: 1.5 mmol/L (ref 0.5–1.9)

## 2017-11-19 LAB — TSH: TSH: 1.396 u[IU]/mL (ref 0.350–4.500)

## 2017-11-19 LAB — PROCALCITONIN: Procalcitonin: <0.1 ng/mL

## 2017-11-19 LAB — STREP PNEUMONIAE URINARY ANTIGEN: Strep Pneumo Urinary Antigen: NEGATIVE

## 2017-11-19 LAB — MRSA PCR SCREENING: MRSA by PCR: NEGATIVE

## 2017-11-19 LAB — PHOSPHORUS: Phosphorus: 3.5 mg/dL (ref 2.5–4.6)

## 2017-11-19 LAB — HIV ANTIBODY (ROUTINE TESTING W REFLEX): HIV Screen 4th Generation wRfx: NONREACTIVE

## 2017-11-19 LAB — INFLUENZA PANEL BY PCR (TYPE A & B)
Influenza A By PCR: NEGATIVE
Influenza B By PCR: NEGATIVE

## 2017-11-19 LAB — MAGNESIUM: Magnesium: 2.1 mg/dL (ref 1.7–2.4)

## 2017-11-19 LAB — GROUP A STREP BY PCR: Group A Strep by PCR: NOT DETECTED

## 2017-11-19 MED ORDER — LORATADINE 10 MG PO TABS
10.0000 mg | ORAL_TABLET | Freq: Every day | ORAL | Status: DC
  Filled 2017-11-19 (×2): qty 1

## 2017-11-19 MED ORDER — ONDANSETRON HCL 4 MG PO TABS: 4.0000 mg | ORAL_TABLET | Freq: Four times a day (QID) | ORAL | Status: DC | PRN

## 2017-11-19 MED ORDER — MAGIC MOUTHWASH W/LIDOCAINE
5.0000 mL | Freq: Three times a day (TID) | ORAL | Status: DC | PRN
  Filled 2017-11-19: qty 5

## 2017-11-19 MED ORDER — TAMSULOSIN HCL 0.4 MG PO CAPS
0.4000 mg | ORAL_CAPSULE | Freq: Two times a day (BID) | ORAL | Status: DC
  Administered 2017-11-19 – 2017-11-20 (×3): 0.4 mg via ORAL
  Filled 2017-11-19 (×3): qty 1

## 2017-11-19 MED ORDER — ACETAMINOPHEN 650 MG RE SUPP: 650.0000 mg | Freq: Four times a day (QID) | RECTAL | Status: DC | PRN

## 2017-11-19 MED ORDER — PREDNISONE 5 MG PO TABS
5.0000 mg | ORAL_TABLET | Freq: Two times a day (BID) | ORAL | Status: DC
  Administered 2017-11-19 – 2017-11-20 (×3): 5 mg via ORAL
  Filled 2017-11-19 (×3): qty 1

## 2017-11-19 MED ORDER — MAGIC MOUTHWASH W/LIDOCAINE: 5.0000 mL | Freq: Two times a day (BID) | ORAL | Status: DC

## 2017-11-19 MED ORDER — EZETIMIBE 10 MG PO TABS
10.0000 mg | ORAL_TABLET | Freq: Every day | ORAL | Status: DC
  Administered 2017-11-19 – 2017-11-20 (×2): 10 mg via ORAL
  Filled 2017-11-19 (×2): qty 1

## 2017-11-19 MED ORDER — ASPIRIN EC 325 MG PO TBEC
325.0000 mg | DELAYED_RELEASE_TABLET | Freq: Every day | ORAL | Status: DC
  Administered 2017-11-19 – 2017-11-20 (×2): 325 mg via ORAL
  Filled 2017-11-19 (×2): qty 1

## 2017-11-19 MED ORDER — ACETAMINOPHEN 325 MG PO TABS
650.0000 mg | ORAL_TABLET | Freq: Four times a day (QID) | ORAL | Status: DC | PRN
  Administered 2017-11-19: 650 mg via ORAL
  Filled 2017-11-19: qty 2

## 2017-11-19 MED ORDER — PIPERACILLIN-TAZOBACTAM 3.375 G IVPB
3.3750 g | Freq: Three times a day (TID) | INTRAVENOUS | Status: DC
  Administered 2017-11-19 – 2017-11-20 (×3): 3.375 g via INTRAVENOUS
  Filled 2017-11-19 (×5): qty 50

## 2017-11-19 MED ORDER — ALPRAZOLAM 0.5 MG PO TABS: 0.5000 mg | ORAL_TABLET | Freq: Every evening | ORAL | Status: DC | PRN

## 2017-11-19 MED ORDER — ENOXAPARIN SODIUM 60 MG/0.6ML ~~LOC~~ SOLN
55.0000 mg | Freq: Every day | SUBCUTANEOUS | Status: DC
  Administered 2017-11-19 – 2017-11-20 (×2): 55 mg via SUBCUTANEOUS
  Filled 2017-11-19 (×2): qty 0.6

## 2017-11-19 MED ORDER — BUTALBITAL-APAP-CAFFEINE 50-325-40 MG PO TABS
1.0000 | ORAL_TABLET | Freq: Four times a day (QID) | ORAL | Status: DC | PRN
  Administered 2017-11-19 – 2017-11-20 (×3): 1 via ORAL
  Filled 2017-11-19 (×3): qty 1

## 2017-11-19 MED ORDER — SODIUM CHLORIDE 0.9 % IV SOLN
INTRAVENOUS | Status: AC
  Administered 2017-11-19 (×2): via INTRAVENOUS

## 2017-11-19 MED ORDER — MAGIC MOUTHWASH W/LIDOCAINE
5.0000 mL | Freq: Every day | ORAL | Status: DC
  Administered 2017-11-19: 5 mL via ORAL
  Filled 2017-11-19 (×2): qty 5

## 2017-11-19 MED ORDER — ONDANSETRON HCL 4 MG/2ML IJ SOLN: 4.0000 mg | Freq: Four times a day (QID) | INTRAMUSCULAR | Status: DC | PRN

## 2017-11-19 MED ORDER — HYDROCODONE-ACETAMINOPHEN 5-325 MG PO TABS
1.0000 | ORAL_TABLET | ORAL | Status: DC | PRN
  Administered 2017-11-19: 1 via ORAL
  Administered 2017-11-20: 2 via ORAL
  Filled 2017-11-19: qty 1
  Filled 2017-11-19: qty 2

## 2017-11-19 NOTE — Progress Notes (Signed)
Rx Brief note: Lovenox  Wt=111 kg, CrCl~119 ml/min, BMI=35  Rx adjusted Lovenox to 55 mg daily (~0.5 mg/kg) in pt with BMI>30  Thanks Dorrene German 11/19/2017 2:18 AM

## 2017-11-19 NOTE — Progress Notes (Signed)
Patient continues to decline nocturnal CPAP. States he does not wish to be bothered each shift to recheck. Order changed to prn only. He is aware that he may call or request for further assistance if he should become more compliant.

## 2017-11-19 NOTE — ED Notes (Signed)
ED TO INPATIENT HANDOFF REPORT  Name/Age/Gender Alejandro [name]. 62 y.o. male  Code Status Code Status History    Date Active Date Inactive Code Status Order ID Comments User Context   10/30/2017 2256 11/02/2017 1358 Full Code [id number]  Phillips Grout, MD ED      Home/SNF/Other Home  Chief Complaint Ca Pt; Home fever 102.0  Level of Care/Admitting Diagnosis ED Disposition    ED Disposition Condition [facility]: [facility] [[id number]]  Level of Care: Med-Surg [16]  Diagnosis: Fever [268341]  Admitting Physician: Toy Baker [[id number]]  Attending Physician: Toy Baker [[id number]]  PT Class (Do Not Modify): Observation [104]  PT Acc Code (Do Not Modify): Observation [10022]       Medical History Past Medical History:  Diagnosis Date  . ANXIETY   . AORTIC STENOSIS, MILD   . CAD   . CHEST PAIN-UNSPECIFIED   . DYSPNEA   . HYPERCHOLESTEROLEMIA   . Hypertension   . INSOMNIA   . OSA on CPAP 05/31/2013  . OSTEOARTHRITIS, KNEE   . Prostate cancer (HCC)     Allergies Allergies  Allergen Reactions  . Codeine Nausea Only    IV Location/Drains/Wounds Patient Lines/Drains/Airways Status   Active Line/Drains/Airways    None          Labs/Imaging Results for orders placed or performed during the hospital encounter of 11/18/17 (from the past 48 hour(s))  Comprehensive metabolic panel     Status: Abnormal   Collection Time: 11/18/17  9:56 PM  Result Value Ref Range   Sodium 138 135 - 145 mmol/L   Potassium 3.6 3.5 - 5.1 mmol/L   Chloride 102 101 - 111 mmol/L   CO2 26 22 - 32 mmol/L   Glucose, Bld 145 (H) 65 - 99 mg/dL   BUN 12 6 - 20 mg/dL   Creatinine, Ser 0.82 0.61 - 1.24 mg/dL   Calcium 9.1 8.9 - 10.3 mg/dL   Total Protein 7.0 6.5 - 8.1 g/dL   Albumin 3.7 3.5 - 5.0 g/dL   AST 19 15 - 41 U/L   ALT 27 17 - 63 U/L   Alkaline Phosphatase 106 38 - 126 U/L   Total Bilirubin 0.6 0.3 - 1.2 mg/dL   GFR calc  non Af Amer >60 >60 mL/min   GFR calc Af Amer >60 >60 mL/min    Comment: (NOTE) The eGFR has been calculated using the CKD EPI equation. This calculation has not been validated in all clinical situations. eGFR's persistently <60 mL/min signify possible Chronic Kidney Disease.    Anion gap 10 5 - 15    Comment: Performed at [facility], [place] [address] [place], [place] [address]  CBC WITH DIFFERENTIAL     Status: Abnormal   Collection Time: 11/18/17  9:56 PM  Result Value Ref Range   WBC 8.2 4.0 - 10.5 K/uL   RBC 4.04 (L) 4.22 - 5.81 MIL/uL   Hemoglobin 12.4 (L) 13.0 - 17.0 g/dL   HCT 37.7 (L) 39.0 - 52.0 %   MCV 93.3 78.0 - 100.0 fL   MCH 30.7 26.0 - 34.0 pg   MCHC 32.9 30.0 - 36.0 g/dL   RDW 13.3 11.5 - 15.5 %   Platelets 193 150 - 400 K/uL   Neutrophils Relative % 59 %   Lymphocytes Relative 19 %   Monocytes Relative 20 %   Eosinophils Relative 1 %   Basophils Relative 1 %  Neutro Abs 4.8 1.7 - 7.7 K/uL   Lymphs Abs 1.6 0.7 - 4.0 K/uL   Monocytes Absolute 1.6 (H) 0.1 - 1.0 K/uL   Eosinophils Absolute 0.1 0.0 - 0.7 K/uL   Basophils Absolute 0.1 0.0 - 0.1 K/uL   WBC Morphology DOHLE BODIES     Comment: Performed at [facility], [place] [address] [place], [place] [address]  I-Stat CG4 Lactic Acid, ED  (not at  [facility])     Status: Abnormal   Collection Time: 11/18/17 10:08 PM  Result Value Ref Range   Lactic Acid, Venous 2.24 (HH) 0.5 - 1.9 mmol/L   Comment NOTIFIED PHYSICIAN   Urinalysis, Routine w reflex microscopic     Status: Abnormal   Collection Time: 11/18/17 10:09 PM  Result Value Ref Range   Color, Urine AMBER (A) YELLOW    Comment: BIOCHEMICALS MAY BE AFFECTED BY COLOR   APPearance CLEAR CLEAR   Specific Gravity, Urine 1.013 1.005 - 1.030   pH 6.0 5.0 - 8.0   Glucose, UA NEGATIVE NEGATIVE mg/dL   Hgb urine dipstick NEGATIVE NEGATIVE   Bilirubin Urine NEGATIVE NEGATIVE   Ketones, ur NEGATIVE NEGATIVE mg/dL   Protein, ur  NEGATIVE NEGATIVE mg/dL   Nitrite NEGATIVE NEGATIVE   Leukocytes, UA NEGATIVE NEGATIVE    Comment: Performed at [facility], [place] [address] [place], [place] [address]  I-Stat CG4 Lactic Acid, ED  (not at  [facility])     Status: Abnormal   Collection Time: 11/18/17 11:54 PM  Result Value Ref Range   Lactic Acid, Venous 3.68 (HH) 0.5 - 1.9 mmol/L   Comment NOTIFIED PHYSICIAN    Dg Chest 2 View  Result Date: 11/18/2017 CLINICAL DATA:  62 y/o M; fever since this afternoon. Patient receiving chemotherapy for prostate cancer. EXAM: CHEST - 2 VIEW COMPARISON:  10/30/2017 chest radiograph FINDINGS: Stable cardiac silhouette within normal limits given projection and technique. Aortic atherosclerosis with calcification. Clear lungs. No pleural effusion or pneumothorax. No acute osseous abnormality is evident. IMPRESSION: No acute pulmonary process identified.  Aortic atherosclerosis. Electronically Signed   By: Kristine Garbe M.D.   On: 11/18/2017 21:48    Pending Labs Unresulted Labs (From admission, onward)   Start     Ordered   11/19/17 0055  Group A Strep by PCR  Once,   R     11/19/17 0054   11/19/17 0054  Strep pneumoniae urinary antigen  Once,   R     11/19/17 0054   11/19/17 0037  Influenza panel by PCR (type A & B)  Once,   R     11/19/17 0037   11/19/17 0037  Respiratory Panel by PCR  (Respiratory virus panel)  Once,   R     11/19/17 0037   11/18/17 2110  Urine culture  STAT,   STAT     11/18/17 2111   11/18/17 2108  Culture, blood (x 2)  BLOOD CULTURE X 2,   STAT    Comments:  INITIATE ANTIBIOTICS WITHIN 1 HOUR AFTER BLOOD CULTURES DRAWN. If unable to obtain blood cultures, call MD immediately regarding antibiotic instructions.    11/18/17 2111   Signed and Held  MRSA PCR Screening  Once,   R    Question:  Patient immune status  Answer:  Immunocompromised   Signed and Held   Signed and Held  HIV antibody (Routine Testing)  Tomorrow morning,   R      Signed and Held  Signed and Held  Magnesium  Tomorrow morning,   R    Comments:  Call MD if <1.5    Signed and Held   Signed and Held  Phosphorus  Tomorrow morning,   R     Signed and Held   Signed and Held  TSH  Once,   R    Comments:  Cancel if already done within 1 month and notify MD    Signed and Held   Signed and Held  Comprehensive metabolic panel  Once,   R    Comments:  Cal MD for K<3.5 or >5.0    Signed and Held   Signed and Held  CBC  Once,   R    Comments:  Call for hg <8.0    Signed and Held   Signed and Held  Lactic acid, plasma  STAT Now then every 3 hours,   STAT     Signed and Held   Signed and Held  Procalcitonin  STAT,   R     Signed and Held      Vitals/Pain Today's Vitals   11/18/17 2100 11/18/17 2230 11/18/17 2315 11/19/17 0045  BP: 129/77 (!) 141/71 131/65 (!) 154/90  Pulse: 65 75 82 70  Resp: 18 (!) 34 15   Temp:      TempSrc:      SpO2: 97% 97% 98% 96%  Weight:      Height:      PainSc:        Isolation Precautions Droplet precaution  Medications Medications  ceFEPIme (MAXIPIME) 2 g in sodium chloride 0.9 % 100 mL IVPB (0 g Intravenous Stopped 11/18/17 2343)  sodium chloride 0.9 % bolus 1,000 mL (0 mLs Intravenous Stopped 11/18/17 2347)  acetaminophen (TYLENOL) tablet 1,000 mg (1,000 mg Oral Given 11/18/17 2342)    Mobility walks

## 2017-11-19 NOTE — H&P (Addendum)
Alejandro [name]. JJO:[id number] DOB: 09/08/1955 DOA: 11/18/2017     PCP: [name], MD   [facility]     Oncology  Dr. Alen Blew Patient arrived to ER on 11/18/17 at 1747  Patient coming from:   home Lives alone,       Chief Complaint: fever HPI: Alejandro [name]. is a 62 y.o. 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  Associated with diffuse myalgias and rigors started today.  No associated cough no shortness of breath chest pain no abdominal pain no nausea vomiting or diarrhea.  Associated with some frontal headache similar to prior has been trying to use Fioricet for this. Reports throat pain and generalized mouth pain. Difficulty sleeping Uses CPAP regularly.   Last chemotherapy infusion was on 11 April he received Taxotere. Followed by Pegfilgrastim on April 12th   Regarding pertinent Chronic problems: History of prostate cancer with metastases to the bone status post radiation therapy to the pelvis with failure on Zytiga . He was started on prednisone and has been taking ever since he is unsure why.   chemotherapy started on Taxotere chemotherapy cycle 1 given in March 2019.    Complicated by neutropenic fever requiring hospitalization discharged on November 02, 2017 History of hypertension on Norvasc, Bystolic and Benicar  While in ER:   Following Medications were ordered in ER: Medications  ceFEPIme (MAXIPIME) 2 g in sodium chloride 0.9 % 100 mL IVPB (0 g Intravenous Stopped 11/18/17 2343)  sodium chloride 0.9 % bolus 1,000 mL (0 mLs Intravenous Stopped 11/18/17 2347)  acetaminophen (TYLENOL) tablet 1,000 mg (1,000 mg Oral Given 11/18/17 2342)    Significant initial  Findings: Abnormal Labs Reviewed  COMPREHENSIVE METABOLIC PANEL - Abnormal; Notable for the following  components:      Result Value   Glucose, Bld 145 (*)    All other components within normal limits  CBC WITH DIFFERENTIAL/PLATELET - Abnormal; Notable for the following components:   RBC 4.04 (*)    Hemoglobin 12.4 (*)    HCT 37.7 (*)    Monocytes Absolute 1.6 (*)    All other components within normal limits  URINALYSIS, ROUTINE W REFLEX MICROSCOPIC - Abnormal; Notable for the following components:   Color, Urine AMBER (*)    All other components within normal limits  I-STAT CG4 LACTIC ACID, ED - Abnormal; Notable for the following components:   Lactic Acid, Venous 2.24 (*)    All other components within normal limits  I-STAT CG4 LACTIC ACID, ED - Abnormal; Notable for the following components:   Lactic Acid, Venous 3.68 (*)    All other components within normal limits     Na 138 K 3.6  Cr  stable,  Lab Results  Component Value Date   CREATININE 0.82 11/18/2017   CREATININE 0.88 11/11/2017   CREATININE 0.72 11/02/2017      WBC  8.2  ANC 4.8  HG/HCT stable,     Component Value Date/Time   HGB 12.4 (L) 11/18/2017 2156   HGB 14.5 06/13/2014 0944   HCT 37.7 (L) 11/18/2017 2156   HCT 42.4 06/13/2014 0944     Lactic Acid, Venous    Component Value Date/Time   LATICACIDVEN 3.68 (HH) 11/18/2017 2354   up from 2.24   UA  no evidence of UTI  CXR -  NON acute     ECG:  Not obtained     ED Triage Vitals  Enc Vitals Group     BP 11/18/17 1827 128/81     Pulse Rate 11/18/17 1827 75     Resp 11/18/17 1827 20     Temp 11/18/17 1827 99.9 F (37.7 C)     Temp Source 11/18/17 1827 Oral     SpO2 11/18/17 1827 96 %     Weight 11/18/17 1827 245 lb (111.1 kg)     Height 11/18/17 1827 5\' 10"  (1.778 m)     Head Circumference --      Peak Flow --      Pain Score 11/18/17 1842 4     Pain Loc --      Pain Edu? --      Excl. in [place]? --   TMAX(24)@       Latest  Blood pressure 131/65, pulse 82, temperature 99.5 F (37.5 C), temperature source Oral, resp. rate  15, height 5\' 10"  (1.778 m), weight 111.1 kg (245 lb), SpO2 98 %.    ER Provider Called:   Oncology   Dr. Burr Medico   They Recommend admission for workup of fever No evidence of neutropenia at this time They will see in consult in a.m. Hospitalist was called for admission for SIRS in the setting of recent chemotherapy   Review of Systems:    Pertinent positives include:  Fevers, chills, fatigue myalgias, headaches,   Constitutional:  No weight loss, night sweats,, weight loss  HEENT:  NoDifficulty swallowing,Tooth/dental problems,Sore throat,  No sneezing, itching, ear ache, nasal congestion, post nasal drip,  Cardio-vascular:  No chest pain, Orthopnea, PND, anasarca, dizziness, palpitations.no Bilateral lower extremity swelling  GI:  No heartburn, indigestion, abdominal pain, nausea, vomiting, diarrhea, change in bowel habits, loss of appetite, melena, blood in stool, hematemesis Resp:  no shortness of breath at rest. No dyspnea on exertion, No excess mucus, no productive cough, No non-productive cough, No coughing up of blood.No change in color of mucus.No wheezing. Skin:  no rash or lesions. No jaundice GU:  no dysuria, change in color of urine, no urgency or frequency. No straining to urinate.  No flank pain.  Musculoskeletal:    or no joint swelling. No decreased range of motion. No back pain.  Psych:  No change in mood or affect. No depression or anxiety. No memory loss.  Neuro: no localizing neurological complaints, no tingling, no weakness, no double vision, no gait abnormality, no slurred speech, no confusion  As per HPI otherwise 10 point review of systems negative.   Past Medical History:   Past Medical History:  Diagnosis Date  . ANXIETY   . AORTIC STENOSIS, MILD   . CAD   . CHEST PAIN-UNSPECIFIED   . DYSPNEA   . HYPERCHOLESTEROLEMIA   . Hypertension   . INSOMNIA   . OSA on CPAP 05/31/2013  . OSTEOARTHRITIS, KNEE   . Prostate cancer [facility])       Past  Surgical History:  Procedure Laterality Date  . COLONOSCOPY    . ELBOW SURGERY    . LUMBAR LAMINECTOMY  Late 1990's  . NEUROPLASTY / TRANSPOSITION MEDIAN NERVE AT CARPAL TUNNEL BILATERAL    . PROSTATE BIOPSY      Social History:  Ambulatory independently      reports that he has quit smoking. His smoking use included cigars and cigarettes. He has a 75.00 pack-year smoking history. He has never  used smokeless tobacco. He reports that he drinks about 6.0 oz of alcohol per week. He reports that he does not use drugs.     Family History:   Family History  Problem Relation Age of Onset  . Cancer Father        prostate    Allergies: Allergies  Allergen Reactions  . Codeine Nausea Only     Prior to Admission medications   Medication Sig Start Date End Date Taking? Authorizing Provider  Alirocumab (PRALUENT [place]) Inject 75 mg into the skin every 14 (fourteen) days.    Yes [provider]  ALPRAZolam Duanne Moron) 0.5 MG tablet Take 0.5 mg by mouth at bedtime as needed for sleep.  09/13/17  Yes [provider]  amLODipine (NORVASC) 10 MG tablet Take 10 mg by mouth daily.   Yes [provider]  aspirin 325 MG EC tablet Take 325 mg by mouth daily.   Yes [provider]  BENICAR 40 MG tablet Take 1 tablet by mouth daily. 09/22/13  Yes [provider]  butalbital-acetaminophen-caffeine (FIORICET, ESGIC) 50-325-40 MG tablet Take 1-2 tablets by mouth every 6 (six) hours as needed for headache. 11/02/17 11/02/18 Yes Sheikh, Omair Latif, DO  BYSTOLIC 10 MG tablet Take 10 mg by mouth every evening.  06/06/14  Yes [provider]  dexamethasone (DECADRON) 4 MG tablet Take 8 mg by mouth 2 (two) times daily. Take 2 tablets BID for 3 Days before chemo, the day of chemo and the day after chemo 10/16/17  Yes [provider]  ezetimibe (ZETIA) 10 MG tablet Take 10 mg by mouth daily.   Yes [provider]  loratadine (CLARITIN) 10 MG tablet  Take 10 mg by mouth daily.   Yes [provider]  magic mouthwash w/lidocaine SOLN Take 5 mLs by mouth 4 (four) times daily as needed for mouth pain. 11/17/17  Yes Wyatt Portela, MD  Multiple Vitamin (MULTIVITAMIN) tablet Take 1 tablet by mouth daily.   Yes [provider]  ondansetron (ZOFRAN) 8 MG tablet Take 8 mg by mouth every 8 (eight) hours as needed for nausea or vomiting.  10/19/17  Yes [provider]  predniSONE (DELTASONE) 5 MG tablet Take 5 mg by mouth 2 (two) times daily with a meal.  09/17/17  Yes [provider]  tamsulosin (FLOMAX) 0.4 MG CAPS capsule Take 0.4 mg by mouth 2 (two) times daily.  03/17/14  Yes [provider]  VIAGRA 100 MG tablet Take 1 tablet by mouth as needed for erectile dysfunction.  10/08/13   [provider]   Physical Exam: Blood pressure 131/65, pulse 82, temperature 99.5 F (37.5 C), temperature source Oral, resp. rate 15, height 5\' 10"  (1.778 m), weight 111.1 kg (245 lb), SpO2 98 %. 1. General:  in No Acute distress  Chronically ill  -appearing 2. Psychological: Alert and  Oriented 3. Head/ENT:     Dry Mucous Membranes                          Head Non traumatic, neck supple                          Poor Dentition 4. SKIN:  decreased Skin turgor,  Skin clean Dry and intact no rash 5. Heart: Regular rate and rhythm no Murmur, no Rub or gallop 6. Lungs:  Clear to auscultation bilaterally, no wheezes or crackles  7. Abdomen: Soft, non-tender, Non distended  Obese bowel sounds present 8. Lower extremities: no clubbing, cyanosis, or edema 9. Neurologically Grossly intact, moving all 4 extremities equally   10. MSK: Normal range of motion   LABS:     Recent Labs  Lab 11/18/17 2156  WBC 8.2  NEUTROABS 4.8  HGB 12.4*  HCT 37.7*  MCV 93.3  PLT 643   Basic Metabolic Panel: Recent Labs  Lab 11/18/17 2156  NA 138  K 3.6  CL 102  CO2 26  GLUCOSE 145*  BUN 12  CREATININE 0.82  CALCIUM 9.1        Recent Labs  Lab 11/18/17 2156  AST 19  ALT 27  ALKPHOS 106  BILITOT 0.6  PROT 7.0  ALBUMIN 3.7   No results for input(s): LIPASE, AMYLASE in the last 168 hours. No results for input(s): AMMONIA in the last 168 hours.    Urine analysis:    Component Value Date/Time   COLORURINE AMBER (A) 11/18/2017 2209   APPEARANCEUR CLEAR 11/18/2017 2209   LABSPEC 1.013 11/18/2017 2209   PHURINE 6.0 11/18/2017 2209   GLUCOSEU NEGATIVE 11/18/2017 2209   HGBUR NEGATIVE 11/18/2017 2209   BILIRUBINUR NEGATIVE 11/18/2017 2209   KETONESUR NEGATIVE 11/18/2017 2209   PROTEINUR NEGATIVE 11/18/2017 2209   NITRITE NEGATIVE 11/18/2017 2209   LEUKOCYTESUR NEGATIVE 11/18/2017 2209      Cultures:    Component Value Date/Time   SDES  10/30/2017 2113    URINE, RANDOM Performed at [facility], [place] [address], [place], [place] [address]    SPECREQUEST  10/30/2017 2113    NONE Performed at [facility], [place] [address] [place], [place] [address]    CULT  10/30/2017 2113    NO GROWTH Performed at [facility] [address], [place], [place] [address]    REPTSTATUS 11/01/2017 FINAL 10/30/2017 2113     Radiological Exams on Admission: Dg Chest 2 View  Result Date: 11/18/2017 CLINICAL DATA:  62 y/o M; fever since this afternoon. Patient receiving chemotherapy for prostate cancer. EXAM: CHEST - 2 VIEW COMPARISON:  10/30/2017 chest radiograph FINDINGS: Stable cardiac silhouette within normal limits given projection and technique. Aortic atherosclerosis with calcification. Clear lungs. No pleural effusion or pneumothorax. No acute osseous abnormality is evident. IMPRESSION: No acute pulmonary process identified.  Aortic atherosclerosis. Electronically Signed   By: Kristine Garbe M.D.   On: 11/18/2017 21:48    Chart has been reviewed    Assessment/Plan  62 y.o. male with medical history significant of Prostate cancer castration  resistant metastatic to the  Bone, aortic stenosis, hypercholesterolemia, hypertension  Admitted for fever unclear etiology in the setting of recent chemotherapy  Present on Admission: . Fever in adult as per recommendation of oncology will switch to Zosyn  continue IV fluid rehydration with results of blood cultures, viral panel, check for influenza, strep and MRSA . Prostate cancer metastatic to bone [facility]) -oncology aware will see patient in consult in a.m. fever could be possibly related to recent chemotherapy administration/Neupogen . Coronary atherosclerosis  - stable continue home medications currently chest pain-free . Essential hypertension -given possibility of early sepsis we will hold off on home medications for tonight restart when able . HYPERCHOLESTEROLEMIA - stable continue home medications zetia OSA - CPAP Chronic steroid use continue for now, will need to discuss with oncology if can wean off.  . Mucositis due to antineoplastic therapy -supportive management with pain control and Magic mouthwash  Headache-most likely secondary to insomnia as well as sleep apnea.  No neurological complaints at this point seems to be waxing and waning associated with caffeine use. [place] acidosis secondary to lactic acid will rehydrate and continue to follow currently does not appear to be toxic stable blood pressure Other plan as per orders.  DVT prophylaxis:    Lovenox     Code Status:   DNR/DNI   as per patient   I had personally discussed CODE STATUS with patient      Family Communication:   Family no at  Bedside   Disposition Plan:    To home once workup is complete and patient is stable                     Consults called: oncology aware  Admission status:   obs   Level of care     medical floor        Chante Mayson 11/19/2017, 1:16 AM    [facility]  Pager [phone]   after 2 AM please page floor coverage PA If 7AM-7PM, please contact the day team taking care  of the patient  [email or web address]  Password [email or web address]

## 2017-11-19 NOTE — Progress Notes (Signed)
Pt refused cpap

## 2017-11-19 NOTE — Progress Notes (Signed)
Pharmacy Antibiotic Note  Alejandro [name]. is a 62 y.o. male with metastatic prostate cancer currently undergoing chemotherapy treatment, presented to the ED on 11/18/2017 with fever.  Patient received cefepime 2gm IV x1 dose in the ED on 4/17 at 2213. To start zosyn on 4/18 for empiric coverage for fever.  - scr 0.70 (crcl~100) - ANC 4.8 - CXR on 4/17 with no acute findings  Plan: - zosyn 3.375 gm IV q8h (infuse over 4 hrs) - with good renal function, pharmacy will sign off for zosyn - Re-consult [place] if need further assistance   _________________________________________  Height: 5\' 10"  (177.8 cm) Weight: 245 lb (111.1 kg) IBW/kg (Calculated) : 73  Temp (24hrs), Avg:98.3 F (36.8 C), Min:97.5 F (36.4 C), Max:99.9 F (37.7 C)  Recent Labs  Lab 11/18/17 2156 11/18/17 2208 11/18/17 2354 11/19/17 0244 11/19/17 0538  WBC 8.2  --   --   --  10.1  CREATININE 0.82  --   --   --  0.70  LATICACIDVEN  --  2.24* 3.68* 1.6 1.5    Estimated Creatinine Clearance: 122.5 mL/min (by C-G formula based on SCr of 0.7 mg/dL).    Allergies  Allergen Reactions  . Codeine Nausea Only    Thank you for allowing pharmacy to be a part of this patient's care.  Lynelle Doctor 11/19/2017 1:16 PM

## 2017-11-19 NOTE — Progress Notes (Addendum)
PROGRESS NOTE  Alejandro [name]. ALP:[id number] DOB: 05/07/1956 DOA: 11/18/2017 PCP: [name], MD  HPI/Recap of past 24 hours: Alejandro Ready. is a 62 y.o. 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 Has a chronic headache/migraine of which is not worsening. Denies any other symptoms, no cough, no abdominal pain, diarrhea. Last chemo was on 11/12/17, received taxotere followed by Pegfilgrastim on 11/13/17.   Today, pt reported feeling ok, denies any new symptoms. Reports generalized bone pain, headache and sore mouth. Has remained afebrile.  Assessment/Plan: Active Problems:   HYPERCHOLESTEROLEMIA   Essential hypertension   Coronary atherosclerosis   OSA on CPAP   Prostate cancer metastatic to bone (HCC)   Fever in adult   Mucositis due to antineoplastic therapy   Lactic acid acidosis   Fever  Fever with recent chemotherapy Not neutropenic (s/p Pegfilgrastim), no clear symptoms Unknown etiology, ?viral Vs medications Resp panel, flu both negative, urine strep pneumo negative, procalcitonin <0.10 BC and UC pending EDP spoke to oncology team, will see patient Continue empiric IV Zosyn as rec by oncology Continue gentle hydration  Lactic acidosis Resolved Likely due to dehydration, continue gentle hydration  Metastatic prostate cancer to bone S/P radiation, castration resistant, on chemotherapy Oncology consulted Pain management  Mucositis Ongoing, likely due to above Continue magic mouthwash  Headache/?migraine Ongoing, started when he began chemotherapy Continue home fioricet If worsening, may consider CT head  CAD  Chest pain free Continue ASA,  bystolic  Essential hypertension Stable Held home BP meds for now  HLD Continue home Zetia  OSA Compliant with CPAP    Code Status: DNR  Family Communication: None at bedside  Disposition Plan: Home by 11/20/17   Consultants:  None  Procedures:  None  Antimicrobials:  IV Zosyn  DVT prophylaxis: Lovenox   Objective: Vitals:   11/19/17 0423 11/19/17 0607 11/19/17 0759 11/19/17 1004  BP: 123/80 119/74 (!) 146/86 131/82  Pulse: 60 (!) 53 (!) 58 63  Resp: 18 18 17 16   Temp: 97.7 F (36.5 C) 97.8 F (36.6 C) (!) 97.5 F (36.4 C) 98 F (36.7 C)  TempSrc: Oral Oral Oral Oral  SpO2: 97% 97% 99% 100%  Weight:      Height:        Intake/Output Summary (Last 24 hours) at 11/19/2017 1234 Last data filed at 11/19/2017 1015 Gross per 24 hour  Intake 1740 ml  Output 1175 ml  Net 565 ml   Filed Weights   11/18/17 1827 11/18/17 1843  Weight: 111.1 kg (245 lb) 111.1 kg (245 lb)    Exam:   General: AAO X 3, NAD   Cardiovascular: S1, S2 present  Respiratory: CTAB  Abdomen: Soft, nontender, nondistended, bowel sounds present  Musculoskeletal: No pedal edema bilaterally  Skin: Normal  Psychiatry: Normal mood   Data Reviewed: CBC: Recent Labs  Lab 11/18/17 2156 11/19/17 0538  WBC 8.2 10.1  NEUTROABS 4.8  --   HGB 12.4* 11.0*  HCT 37.7* 33.3*  MCV 93.3 93.3  PLT 193 353   Basic Metabolic Panel: Recent Labs  Lab 11/18/17 2156 11/19/17 0538  NA 138 142  K 3.6 3.8  CL 102 107  CO2 26 24  GLUCOSE 145* 107*  BUN 12 11  CREATININE 0.82  0.70  CALCIUM 9.1 8.5*  MG  --  2.1  PHOS  --  3.5   GFR: Estimated Creatinine Clearance: 122.5 mL/min (by C-G formula based on SCr of 0.7 mg/dL). Liver Function Tests: Recent Labs  Lab 11/18/17 2156 11/19/17 0538  AST 19 16  ALT 27 23  ALKPHOS 106 88  BILITOT 0.6 0.7  PROT 7.0 5.8*  ALBUMIN 3.7 3.0*   No results for input(s): LIPASE, AMYLASE in the last 168 hours. No results for input(s):  AMMONIA in the last 168 hours. Coagulation Profile: No results for input(s): INR, PROTIME in the last 168 hours. Cardiac Enzymes: No results for input(s): CKTOTAL, CKMB, CKMBINDEX, TROPONINI in the last 168 hours. BNP (last 3 results) No results for input(s): PROBNP in the last 8760 hours. HbA1C: No results for input(s): HGBA1C in the last 72 hours. CBG: No results for input(s): GLUCAP in the last 168 hours. Lipid Profile: No results for input(s): CHOL, HDL, LDLCALC, TRIG, CHOLHDL, LDLDIRECT in the last 72 hours. Thyroid Function Tests: Recent Labs    11/19/17 0538  TSH 1.396   Anemia Panel: No results for input(s): VITAMINB12, FOLATE, FERRITIN, TIBC, IRON, RETICCTPCT in the last 72 hours. Urine analysis:    Component Value Date/Time   COLORURINE AMBER (A) 11/18/2017 2209   APPEARANCEUR CLEAR 11/18/2017 2209   LABSPEC 1.013 11/18/2017 2209   PHURINE 6.0 11/18/2017 2209   GLUCOSEU NEGATIVE 11/18/2017 2209   HGBUR NEGATIVE 11/18/2017 2209   BILIRUBINUR NEGATIVE 11/18/2017 2209   KETONESUR NEGATIVE 11/18/2017 2209   PROTEINUR NEGATIVE 11/18/2017 2209   NITRITE NEGATIVE 11/18/2017 2209   LEUKOCYTESUR NEGATIVE 11/18/2017 2209   Sepsis Labs: @LABRCNTIP (procalcitonin:4,lacticidven:4)  ) Recent Results (from the past 240 hour(s))  Respiratory Panel by PCR     Status: None   Collection Time: 11/19/17  1:21 AM  Result Value Ref Range Status   Adenovirus NOT DETECTED NOT DETECTED Final   Coronavirus 229E NOT DETECTED NOT DETECTED Final   Coronavirus HKU1 NOT DETECTED NOT DETECTED Final   Coronavirus NL63 NOT DETECTED NOT DETECTED Final   Coronavirus OC43 NOT DETECTED NOT DETECTED Final   Metapneumovirus NOT DETECTED NOT DETECTED Final   Rhinovirus / Enterovirus NOT DETECTED NOT DETECTED Final   Influenza A NOT DETECTED NOT DETECTED Final   Influenza B NOT DETECTED NOT DETECTED Final   Parainfluenza Virus 1 NOT DETECTED NOT DETECTED Final   Parainfluenza Virus 2 NOT DETECTED  NOT DETECTED Final   Parainfluenza Virus 3 NOT DETECTED NOT DETECTED Final   Parainfluenza Virus 4 NOT DETECTED NOT DETECTED Final   Respiratory Syncytial Virus NOT DETECTED NOT DETECTED Final   Bordetella pertussis NOT DETECTED NOT DETECTED Final   Chlamydophila pneumoniae NOT DETECTED NOT DETECTED Final   Mycoplasma pneumoniae NOT DETECTED NOT DETECTED Final    Comment: Performed at [facility], [place] [address], [place], [place] [address]  MRSA PCR Screening     Status: None   Collection Time: 11/19/17  6:30 AM  Result Value Ref Range Status   MRSA by PCR NEGATIVE NEGATIVE Final    Comment:        The GeneXpert MRSA Assay (FDA approved for NASAL specimens only), is one component of a comprehensive MRSA colonization surveillance program. It is not intended to diagnose MRSA infection nor to guide or monitor treatment for MRSA infections. Performed at [facility], [place] [address] [place], [place] [address]   Group A Strep by PCR     Status:  None   Collection Time: 11/19/17  6:36 AM  Result Value Ref Range Status   Group A Strep by PCR NOT DETECTED NOT DETECTED Final    Comment: Performed at [facility], [place] [address] [place], [place] [address]      Studies: Dg Chest 2 View  Result Date: 11/18/2017 CLINICAL DATA:  62 y/o M; fever since this afternoon. Patient receiving chemotherapy for prostate cancer. EXAM: CHEST - 2 VIEW COMPARISON:  10/30/2017 chest radiograph FINDINGS: Stable cardiac silhouette within normal limits given projection and technique. Aortic atherosclerosis with calcification. Clear lungs. No pleural effusion or pneumothorax. No acute osseous abnormality is evident. IMPRESSION: No acute pulmonary process identified.  Aortic atherosclerosis. Electronically Signed   By: Kristine Garbe M.D.   On: 11/18/2017 21:48    Scheduled Meds: . aspirin  325 mg Oral Daily  . enoxaparin (LOVENOX) injection  55 mg  Subcutaneous Daily  . ezetimibe  10 mg Oral Daily  . loratadine  10 mg Oral Daily  . magic mouthwash w/lidocaine  5 mL Oral q1800  . predniSONE  5 mg Oral BID WC  . tamsulosin  0.4 mg Oral BID    Continuous Infusions:   LOS: 0 days     Alejandro Friendly, MD [facility]   If 7PM-7AM, please contact night-coverage [email or web address] Password [facility] 11/19/2017, 12:34 PM

## 2017-11-19 NOTE — ED Notes (Signed)
First attempt to call report, transferred with no response.

## 2017-11-20 DIAGNOSIS — K1231 Oral mucositis (ulcerative) due to antineoplastic therapy: Secondary | ICD-10-CM | POA: Diagnosis not present

## 2017-11-20 DIAGNOSIS — I10 Essential (primary) hypertension: Secondary | ICD-10-CM | POA: Diagnosis not present

## 2017-11-20 DIAGNOSIS — Z9989 Dependence on other enabling machines and devices: Secondary | ICD-10-CM | POA: Diagnosis not present

## 2017-11-20 DIAGNOSIS — C61 Malignant neoplasm of prostate: Secondary | ICD-10-CM | POA: Diagnosis not present

## 2017-11-20 DIAGNOSIS — E7800 Pure hypercholesterolemia, unspecified: Secondary | ICD-10-CM

## 2017-11-20 DIAGNOSIS — E872 Acidosis: Secondary | ICD-10-CM | POA: Diagnosis not present

## 2017-11-20 DIAGNOSIS — R509 Fever, unspecified: Secondary | ICD-10-CM | POA: Diagnosis not present

## 2017-11-20 DIAGNOSIS — C7951 Secondary malignant neoplasm of bone: Secondary | ICD-10-CM | POA: Diagnosis not present

## 2017-11-20 DIAGNOSIS — G4733 Obstructive sleep apnea (adult) (pediatric): Secondary | ICD-10-CM | POA: Diagnosis not present

## 2017-11-20 LAB — URINE CULTURE: Culture: NO GROWTH

## 2017-11-20 LAB — CBC WITH DIFFERENTIAL/PLATELET
Basophils Absolute: 0 10*3/uL (ref 0.0–0.1)
Basophils Relative: 0 %
Eosinophils Absolute: 0 10*3/uL (ref 0.0–0.7)
Eosinophils Relative: 0 %
HCT: 34.6 % — ABNORMAL LOW (ref 39.0–52.0)
Hemoglobin: 11.5 g/dL — ABNORMAL LOW (ref 13.0–17.0)
Lymphocytes Relative: 11 %
Lymphs Abs: 1.8 10*3/uL (ref 0.7–4.0)
MCH: 31 pg (ref 26.0–34.0)
MCHC: 33.2 g/dL (ref 30.0–36.0)
MCV: 93.3 fL (ref 78.0–100.0)
Monocytes Absolute: 1.8 10*3/uL — ABNORMAL HIGH (ref 0.1–1.0)
Monocytes Relative: 11 %
Neutro Abs: 12.5 10*3/uL — ABNORMAL HIGH (ref 1.7–7.7)
Neutrophils Relative %: 78 %
Platelets: 202 10*3/uL (ref 150–400)
RBC: 3.71 MIL/uL — ABNORMAL LOW (ref 4.22–5.81)
RDW: 13.4 % (ref 11.5–15.5)
WBC: 16.1 10*3/uL — ABNORMAL HIGH (ref 4.0–10.5)

## 2017-11-20 MED ORDER — BUTALBITAL-APAP-CAFFEINE 50-325-40 MG PO TABS: 1.0000 | ORAL_TABLET | Freq: Four times a day (QID) | ORAL | 0 refills | Status: AC | PRN

## 2017-11-20 NOTE — Progress Notes (Signed)
I was notified the patient still hospitalized. I have no objections to discharge if he is afebrile and has negative culture. I will see him later this afternoon.

## 2017-11-20 NOTE — Discharge Summary (Signed)
Discharge Summary  [facility]. OYD:[id number] DOB: 16-Dec-1955  PCP: [name], MD  Admit date: 11/18/2017 Discharge date: 11/20/2017  Time spent: 25 mins  Recommendations for Outpatient Follow-up:  1. PCP 2. Oncology follow up  Discharge Diagnoses:  Active Hospital Problems   Diagnosis Date Noted  . Prostate cancer metastatic to bone ([place]) 11/19/2017  . Fever in adult 11/19/2017  . Mucositis due to antineoplastic therapy 11/19/2017  . Lactic acid acidosis 11/19/2017  . Fever 11/19/2017  . OSA on CPAP 05/31/2013  . Coronary atherosclerosis 12/07/2009  . Essential hypertension 12/07/2009  . HYPERCHOLESTEROLEMIA 12/07/2009    Resolved Hospital Problems  No resolved problems to display.    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 Has a chronic headache/migraine of which is not worsening. Denies any other symptoms, no cough, no abdominal pain, diarrhea. Last chemo was on 11/12/17, received taxotere followed by Pegfilgrastim on 11/13/17.   Today, pt reported feeling much better, denies any new symptoms. Has remained afebrile since admission. Oncology Dr Alen Blew in agreement with d/c today with no need for antibiotics. Close follow up with Oncology and PCP.   Hospital Course:  Active Problems:   HYPERCHOLESTEROLEMIA   Essential hypertension   Coronary atherosclerosis   OSA on CPAP  Prostate cancer metastatic to bone (HCC)   Fever in adult   Mucositis due to antineoplastic therapy   Lactic acid acidosis   Fever   Fever with recent chemotherapy Not neutropenic (s/p Pegfilgrastim), no clear symptoms, has been afebrile since admission Unknown etiology, ?viral Vs medication Vs malignancy Resp panel, influenza both negative, urine strep pneumo negative, procalcitonin <0.10 BC X 2, NGTD and UC no growth S/P empiric IV Zosyn Oncology Dr Alen Blew in agreement with d/c today with no need for antibiotics Has a follow up appointment with oncology in the next few weeks. PCP in 1 week  Lactic acidosis Resolved Likely due to dehydration  Metastatic prostate cancer to bone S/P radiation, castration resistant, on chemotherapy Follow up with oncology  Mucositis Improving, likely due to above Continue magic mouthwash  Headache/?migraine Ongoing, started when he began chemotherapy Continue home fioricet  CAD  Chest pain free Continue ASA, bystolic  Essential hypertension Stable Continue home BP meds  HLD Continue home Zetia  OSA Encourage compliance with CPAP     Procedures:  None  Consultations:  Oncology  Discharge Exam: BP (!) 154/94 (BP Location: Left Arm)   Pulse (!) 54   Temp 98 F (36.7 C) (Oral)   Resp 16   Ht 5\' 10"  (1.778 m)   Wt 111.1 kg (245 lb)   SpO2 98%   BMI 35.15 kg/m   General: NAD Cardiovascular: S1, S2 present Respiratory: CTAB   Discharge Instructions You were cared for by a hospitalist during your hospital stay. If you have any questions about your discharge medications or the care you received while you were in the hospital after you are  discharged, you can call the unit and asked to speak with the hospitalist on call if the hospitalist that took care of you is not available. Once you are discharged, your primary care physician will handle any further medical issues. Please note that NO REFILLS for any discharge  medications will be authorized once you are discharged, as it is imperative that you return to your primary care physician (or establish a relationship with a primary care physician if you do not have one) for your aftercare needs so that they can reassess your need for medications and monitor your lab values.  Discharge Instructions    Diet - low sodium heart healthy   Complete by:  As directed    Increase activity slowly   Complete by:  As directed      Allergies as of 11/20/2017      Reactions   Codeine Nausea Only      Medication List    TAKE these medications   ALPRAZolam 0.5 MG tablet Commonly known as:  XANAX Take 0.5 mg by mouth at bedtime as needed for sleep.   amLODipine 10 MG tablet Commonly known as:  NORVASC Take 10 mg by mouth daily.   aspirin 325 MG EC tablet Take 325 mg by mouth daily.   BENICAR 40 MG tablet Generic drug:  olmesartan Take 1 tablet by mouth daily.   butalbital-acetaminophen-caffeine 50-325-40 MG tablet Commonly known as:  FIORICET, ESGIC Take 1 tablet by mouth every 6 (six) hours as needed for headache. What changed:  how much to take   BYSTOLIC 10 MG tablet Generic drug:  nebivolol Take 10 mg by mouth every evening.   dexamethasone 4 MG tablet Commonly known as:  DECADRON Take 8 mg by mouth 2 (two) times daily. Take 2 tablets BID for 3 Days before chemo, the day of chemo and the day after chemo   ezetimibe 10 MG tablet Commonly known as:  ZETIA Take 10 mg by mouth daily.   loratadine 10 MG tablet Commonly known as:  CLARITIN Take 10 mg by mouth daily.   magic mouthwash w/lidocaine Soln Take 5 mLs by mouth 4 (four) times daily as needed for mouth pain.   multivitamin tablet Take 1 tablet by mouth daily.   ondansetron 8 MG tablet Commonly known as:  ZOFRAN Take 8 mg by mouth every 8 (eight) hours as needed for nausea or vomiting.   PRALUENT [place] Inject 75 mg into the skin every 14 (fourteen) days.   predniSONE 5 MG  tablet Commonly known as:  DELTASONE Take 5 mg by mouth 2 (two) times daily with a meal.   tamsulosin 0.4 MG Caps capsule Commonly known as:  FLOMAX Take 0.4 mg by mouth 2 (two) times daily.      Allergies  Allergen Reactions  . Codeine Nausea Only   Follow-up Information    [name], MD. Schedule an appointment as soon as possible for a visit in 1 week(s).   Specialty:  Internal Medicine Contact information: [place] [address] [phone]            The results of significant diagnostics from this hospitalization (including imaging, microbiology, ancillary and laboratory) are listed below for reference.    Significant Diagnostic Studies: Dg Chest 2 View  Result Date: 11/18/2017 CLINICAL DATA:  62 y/o M; fever since this afternoon. Patient receiving chemotherapy for prostate cancer. EXAM: CHEST - 2 VIEW COMPARISON:  10/30/2017 chest radiograph FINDINGS: Stable cardiac silhouette within normal limits  given projection and technique. Aortic atherosclerosis with calcification. Clear lungs. No pleural effusion or pneumothorax. No acute osseous abnormality is evident. IMPRESSION: No acute pulmonary process identified.  Aortic atherosclerosis. Electronically Signed   By: Kristine Garbe M.D.   On: 11/18/2017 21:48   Dg Chest 2 View  Result Date: 10/30/2017 CLINICAL DATA:  Fever. Ongoing chemotherapy for metastatic prostate cancer. EXAM: CHEST - 2 VIEW COMPARISON:  10/07/2007 FINDINGS: The cardiomediastinal silhouette is within normal limits. Aortic atherosclerosis is noted. The lungs are mildly hyperinflated without evidence of airspace consolidation, edema, pleural effusion, pneumothorax. Old left rib fractures are noted. IMPRESSION: No active cardiopulmonary disease. Electronically Signed   By: Logan Bores M.D.   On: 10/30/2017 20:31    Microbiology: Recent Results (from the past 240 hour(s))  Culture, blood (x 2)     Status: None (Preliminary  result)   Collection Time: 11/18/17  9:52 PM  Result Value Ref Range Status   Specimen Description   Final    BLOOD RIGHT ANTECUBITAL Performed at [facility] [address], [place], [place] [address]    Special Requests   Final    BOTTLES DRAWN AEROBIC AND ANAEROBIC Blood Culture adequate volume Performed at [facility] [address], [place], [place] [address]    Culture   Final    NO GROWTH < 24 HOURS Performed at [facility] [address], [place], [place] [address]    Report Status PENDING  Incomplete  Culture, blood (x 2)     Status: None (Preliminary result)   Collection Time: 11/18/17 10:00 PM  Result Value Ref Range Status   Specimen Description   Final    BLOOD LEFT HAND Performed at [facility] [address], [place], [place] [address]    Special Requests   Final    BOTTLES DRAWN AEROBIC AND ANAEROBIC Blood Culture adequate volume Performed at [facility] [address] [place], [place] [address]    Culture   Final    NO GROWTH < 24 HOURS Performed at [facility] [address], [place], [place] [address]    Report Status PENDING  Incomplete  Urine culture     Status: None   Collection Time: 11/18/17 10:09 PM  Result Value Ref Range Status   Specimen Description   Final    URINE, RANDOM Performed at [facility] [address], [place], [place] [address]    Special Requests   Final    NONE Performed at [facility], [place] [address] [place], [place] [address]    Culture   Final    NO GROWTH Performed at [facility], [place] [address] [place], [place] [address]    Report Status 11/20/2017 FINAL  Final  Respiratory Panel by PCR     Status: None   Collection Time: 11/19/17  1:21 AM  Result Value Ref Range Status   Adenovirus NOT DETECTED NOT DETECTED Final   Coronavirus 229E NOT DETECTED NOT DETECTED Final    Coronavirus HKU1 NOT DETECTED NOT DETECTED Final   Coronavirus NL63 NOT DETECTED NOT DETECTED Final   Coronavirus OC43 NOT DETECTED NOT DETECTED Final   Metapneumovirus NOT DETECTED NOT DETECTED Final   Rhinovirus / Enterovirus NOT DETECTED NOT DETECTED Final   Influenza A NOT DETECTED NOT DETECTED Final   Influenza B NOT DETECTED NOT DETECTED Final   Parainfluenza Virus 1 NOT DETECTED NOT DETECTED Final  Parainfluenza Virus 2 NOT DETECTED NOT DETECTED Final   Parainfluenza Virus 3 NOT DETECTED NOT DETECTED Final   Parainfluenza Virus 4 NOT DETECTED NOT DETECTED Final   Respiratory Syncytial Virus NOT DETECTED NOT DETECTED Final   Bordetella pertussis NOT DETECTED NOT DETECTED Final   Chlamydophila pneumoniae NOT DETECTED NOT DETECTED Final   Mycoplasma pneumoniae NOT DETECTED NOT DETECTED Final    Comment: Performed at [facility], [place] [address], [place], [place] [address]  MRSA PCR Screening     Status: None   Collection Time: 11/19/17  6:30 AM  Result Value Ref Range Status   MRSA by PCR NEGATIVE NEGATIVE Final    Comment:        The GeneXpert MRSA Assay (FDA approved for NASAL specimens only), is one component of a comprehensive MRSA colonization surveillance program. It is not intended to diagnose MRSA infection nor to guide or monitor treatment for MRSA infections. Performed at [facility], [place] [address], [place], [place] [address]   Group A Strep by PCR     Status: None   Collection Time: 11/19/17  6:36 AM  Result Value Ref Range Status   Group A Strep by PCR NOT DETECTED NOT DETECTED Final    Comment: Performed at [facility], [place] [address], [place], [place] [address]     Labs: Basic Metabolic Panel: Recent Labs  Lab 11/18/17 2156 11/19/17 0538  NA 138 142  K 3.6 3.8  CL 102 107  CO2 26 24  GLUCOSE 145* 107*  BUN 12 11  CREATININE 0.82 0.70  CALCIUM 9.1 8.5*  MG  --  2.1  PHOS  --  3.5   Liver Function  Tests: Recent Labs  Lab 11/18/17 2156 11/19/17 0538  AST 19 16  ALT 27 23  ALKPHOS 106 88  BILITOT 0.6 0.7  PROT 7.0 5.8*  ALBUMIN 3.7 3.0*   No results for input(s): LIPASE, AMYLASE in the last 168 hours. No results for input(s): AMMONIA in the last 168 hours. CBC: Recent Labs  Lab 11/18/17 2156 11/19/17 0538 11/20/17 0423  WBC 8.2 10.1 16.1*  NEUTROABS 4.8  --  12.5*  HGB 12.4* 11.0* 11.5*  HCT 37.7* 33.3* 34.6*  MCV 93.3 93.3 93.3  PLT 193 171 202   Cardiac Enzymes: No results for input(s): CKTOTAL, CKMB, CKMBINDEX, TROPONINI in the last 168 hours. BNP: BNP (last 3 results) No results for input(s): BNP in the last 8760 hours.  ProBNP (last 3 results) No results for input(s): PROBNP in the last 8760 hours.  CBG: No results for input(s): GLUCAP in the last 168 hours.     Signed:  Alma Friendly, MD [facility] 11/20/2017, 12:44 PM

## 2017-11-20 NOTE — Progress Notes (Signed)
Patient seen and examined today.  Medical records were also reviewed and all his questions were answered.  I have no objections to discharge at this time as he is feeling well and appears to be clinically stable.  He has a follow-up next week at the [facility].  I do not recommend antibiotics upon discharge.

## 2017-11-23 LAB — CULTURE, BLOOD (ROUTINE X 2)
Culture: NO GROWTH
Culture: NO GROWTH
Special Requests: ADEQUATE
Special Requests: ADEQUATE

## 2017-12-04 ENCOUNTER — Inpatient Hospital Stay: Payer: 59 | Attending: Oncology

## 2017-12-04 ENCOUNTER — Encounter: Payer: Self-pay | Admitting: Oncology

## 2017-12-04 ENCOUNTER — Inpatient Hospital Stay: Payer: 59

## 2017-12-04 ENCOUNTER — Inpatient Hospital Stay (HOSPITAL_BASED_OUTPATIENT_CLINIC_OR_DEPARTMENT_OTHER): Payer: 59 | Admitting: Oncology

## 2017-12-04 VITALS — BP 145/88 | HR 61 | Temp 98.0°F | Resp 18 | Ht 70.0 in | Wt 254.4 lb

## 2017-12-04 DIAGNOSIS — F419 Anxiety disorder, unspecified: Secondary | ICD-10-CM

## 2017-12-04 DIAGNOSIS — Z79899 Other long term (current) drug therapy: Secondary | ICD-10-CM | POA: Diagnosis not present

## 2017-12-04 DIAGNOSIS — Z192 Hormone resistant malignancy status: Secondary | ICD-10-CM | POA: Insufficient documentation

## 2017-12-04 DIAGNOSIS — I700 Atherosclerosis of aorta: Secondary | ICD-10-CM | POA: Insufficient documentation

## 2017-12-04 DIAGNOSIS — Z923 Personal history of irradiation: Secondary | ICD-10-CM

## 2017-12-04 DIAGNOSIS — I10 Essential (primary) hypertension: Secondary | ICD-10-CM | POA: Insufficient documentation

## 2017-12-04 DIAGNOSIS — Z79818 Long term (current) use of other agents affecting estrogen receptors and estrogen levels: Secondary | ICD-10-CM

## 2017-12-04 DIAGNOSIS — C7951 Secondary malignant neoplasm of bone: Secondary | ICD-10-CM | POA: Diagnosis not present

## 2017-12-04 DIAGNOSIS — G4700 Insomnia, unspecified: Secondary | ICD-10-CM | POA: Insufficient documentation

## 2017-12-04 DIAGNOSIS — E7800 Pure hypercholesterolemia, unspecified: Secondary | ICD-10-CM | POA: Diagnosis not present

## 2017-12-04 DIAGNOSIS — M1990 Unspecified osteoarthritis, unspecified site: Secondary | ICD-10-CM | POA: Diagnosis not present

## 2017-12-04 DIAGNOSIS — I2510 Atherosclerotic heart disease of native coronary artery without angina pectoris: Secondary | ICD-10-CM | POA: Insufficient documentation

## 2017-12-04 DIAGNOSIS — I350 Nonrheumatic aortic (valve) stenosis: Secondary | ICD-10-CM | POA: Diagnosis not present

## 2017-12-04 DIAGNOSIS — Z5111 Encounter for antineoplastic chemotherapy: Secondary | ICD-10-CM | POA: Insufficient documentation

## 2017-12-04 DIAGNOSIS — C61 Malignant neoplasm of prostate: Secondary | ICD-10-CM

## 2017-12-04 DIAGNOSIS — R0602 Shortness of breath: Secondary | ICD-10-CM | POA: Diagnosis not present

## 2017-12-04 DIAGNOSIS — R51 Headache: Secondary | ICD-10-CM | POA: Insufficient documentation

## 2017-12-04 LAB — CMP (CANCER CENTER ONLY)
ALT: 27 U/L (ref 0–55)
AST: 13 U/L (ref 5–34)
Albumin: 4.1 g/dL (ref 3.5–5.0)
Alkaline Phosphatase: 129 U/L (ref 40–150)
Anion gap: 10 (ref 3–11)
BUN: 21 mg/dL (ref 7–26)
CO2: 21 mmol/L — ABNORMAL LOW (ref 22–29)
Calcium: 10 mg/dL (ref 8.4–10.4)
Chloride: 104 mmol/L (ref 98–109)
Creatinine: 0.86 mg/dL (ref 0.70–1.30)
GFR, Est AFR Am: >60 mL/min (ref >60)
GFR, Estimated: >60 mL/min (ref >60)
Glucose, Bld: 171 mg/dL — ABNORMAL HIGH (ref 70–140)
Potassium: 4.2 mmol/L (ref 3.5–5.1)
Sodium: 135 mmol/L — ABNORMAL LOW (ref 136–145)
Total Bilirubin: 0.3 mg/dL (ref 0.2–1.2)
Total Protein: 7.3 g/dL (ref 6.4–8.3)

## 2017-12-04 LAB — CBC WITH DIFFERENTIAL (CANCER CENTER ONLY)
Basophils Absolute: 0 10*3/uL (ref 0.0–0.1)
Basophils Relative: 0 %
Eosinophils Absolute: 0 10*3/uL (ref 0.0–0.5)
Eosinophils Relative: 0 %
HCT: 36.7 % — ABNORMAL LOW (ref 38.4–49.9)
Hemoglobin: 12.2 g/dL — ABNORMAL LOW (ref 13.0–17.1)
Lymphocytes Relative: 5 %
Lymphs Abs: 0.8 10*3/uL — ABNORMAL LOW (ref 0.9–3.3)
MCH: 31 pg (ref 27.2–33.4)
MCHC: 33.2 g/dL (ref 32.0–36.0)
MCV: 93.4 fL (ref 79.3–98.0)
Monocytes Absolute: 1.3 10*3/uL — ABNORMAL HIGH (ref 0.1–0.9)
Monocytes Relative: 7 %
Neutro Abs: 15.6 10*3/uL — ABNORMAL HIGH (ref 1.5–6.5)
Neutrophils Relative %: 88 %
Platelet Count: 322 10*3/uL (ref 140–400)
RBC: 3.93 MIL/uL — ABNORMAL LOW (ref 4.20–5.82)
RDW: 14.5 % (ref 11.0–14.6)
WBC Count: 17.7 10*3/uL — ABNORMAL HIGH (ref 4.0–10.3)

## 2017-12-04 MED ORDER — SODIUM CHLORIDE 0.9 % IV SOLN
Freq: Once | INTRAVENOUS | Status: AC
  Administered 2017-12-04: 11:00:00 via INTRAVENOUS

## 2017-12-04 MED ORDER — DEXAMETHASONE SODIUM PHOSPHATE 10 MG/ML IJ SOLN
INTRAMUSCULAR | Status: AC
  Filled 2017-12-04: qty 1

## 2017-12-04 MED ORDER — SODIUM CHLORIDE 0.9 % IV SOLN
75.0000 mg/m2 | Freq: Once | INTRAVENOUS | Status: AC
  Administered 2017-12-04: 170 mg via INTRAVENOUS
  Filled 2017-12-04: qty 17

## 2017-12-04 MED ORDER — LEUPROLIDE ACETATE (4 MONTH) 30 MG IM KIT
30.0000 mg | PACK | Freq: Once | INTRAMUSCULAR | Status: AC
  Administered 2017-12-04: 30 mg via INTRAMUSCULAR
  Filled 2017-12-04: qty 30

## 2017-12-04 MED ORDER — DEXAMETHASONE SODIUM PHOSPHATE 10 MG/ML IJ SOLN
10.0000 mg | Freq: Once | INTRAMUSCULAR | Status: AC
  Administered 2017-12-04: 10 mg via INTRAVENOUS

## 2017-12-04 NOTE — Patient Instructions (Signed)
[facility] Discharge Instructions for Patients Receiving Chemotherapy  Today you received the following chemotherapy agents Taxotere, Lupron Injection.   To help prevent nausea and vomiting after your treatment, we encourage you to take your nausea medication as prescribed.   If you develop nausea and vomiting that is not controlled by your nausea medication, call the clinic.   BELOW ARE SYMPTOMS THAT SHOULD BE REPORTED IMMEDIATELY:  *FEVER GREATER THAN 100.5 F  *CHILLS WITH OR WITHOUT FEVER  NAUSEA AND VOMITING THAT IS NOT CONTROLLED WITH YOUR NAUSEA MEDICATION  *UNUSUAL SHORTNESS OF BREATH  *UNUSUAL BRUISING OR BLEEDING  TENDERNESS IN MOUTH AND THROAT WITH OR WITHOUT PRESENCE OF ULCERS  *URINARY PROBLEMS  *BOWEL PROBLEMS  UNUSUAL RASH Items with * indicate a potential emergency and should be followed up as soon as possible.  Feel free to call the clinic should you have any questions or concerns. The clinic phone number is [phone].  Please show the [facility] at check-in to the Emergency Department and triage nurse.

## 2017-12-04 NOTE — Progress Notes (Signed)
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  He was  diagnosed in July of 2015. He had a PSA of 6.4 and a Gleason score 4+4 equals 8 high volume disease.   PRIOR THERAPY: Status post biopsy done on July of 2015.  He is status post lymph node dissection completed in 2015 at [facility].  He was treated there with definitive radiation therapy utilizing seed implant as well as external beam radiation completed in 2016.  He developed advanced disease with bone involvement.  He received radiation therapy to the pelvis in 2017.  Xtandi 160 mg daily that was poorly tolerated and was discontinued.  He was switched to [name] which she took until 2019 and developed progression of disease with bony metastasis as well as PSA of 80.  CURRENT THERAPY: Taxotere chemotherapy cycle 1 given in March 2019.  He is here for evaluation for cycle 3 of therapy.  INTERVAL HISTORY: Alejandro [name]. 62 y.o. male returns for routine follow-up visit by himself.  The patient is feeling fine today and has no specific complaints.  He was recently hospitalized for fever.  The patient was not neutropenic at the time and the source of infection was not identified.  Patient has remained afebrile since hospital discharge.  Denies fevers and chills.  Denies chest pain, shortness breath, cough, hemoptysis.  Denies nausea, vomiting, constipation, diarrhea.  Denies recent weight loss or night sweats.  He denies arthralgias and myalgias except for pain related to Neulasta injection.  Denies urinary frequency, dysuria, hematuria.  Remaining review of systems is negative.  The patient is here for evaluation prior to cycle #3 of his treatment.  MEDICAL HISTORY: Past Medical History:  Diagnosis Date  . ANXIETY   . AORTIC STENOSIS, MILD   . CAD   . CHEST  PAIN-UNSPECIFIED   . DYSPNEA   . HYPERCHOLESTEROLEMIA   . Hypertension   . INSOMNIA   . OSA on CPAP 05/31/2013  . OSTEOARTHRITIS, KNEE   . Prostate cancer ([facility])     ALLERGIES:  is allergic to codeine.  MEDICATIONS:  Current Outpatient Medications  Medication Sig Dispense Refill  . Alirocumab (PRALUENT [place]) Inject 75 mg into the skin every 14 (fourteen) days.     . ALPRAZolam (XANAX) 0.5 MG tablet Take 0.5 mg by mouth at bedtime as needed for sleep.     Marland Kitchen amLODipine (NORVASC) 10 MG tablet Take 10 mg by mouth daily.    Marland Kitchen aspirin 325 MG EC tablet Take 325 mg by mouth daily.    Marland Kitchen BENICAR 40 MG tablet Take 1 tablet by mouth daily.    . butalbital-acetaminophen-caffeine (FIORICET, ESGIC) 50-325-40 MG tablet Take 1 tablet by mouth every 6 (six) hours as needed for headache. 30 tablet 0  . BYSTOLIC 10 MG tablet Take 10 mg by mouth every evening.   4  . dexamethasone (DECADRON) 4 MG tablet Take 8 mg by mouth 2 (two) times daily. Take 2 tablets BID for 3 Days before chemo, the day of chemo and the day after chemo    . ezetimibe (ZETIA) 10 MG tablet Take 10 mg by mouth daily.    Marland Kitchen loratadine (CLARITIN) 10 MG tablet Take 10 mg by mouth daily.    . magic mouthwash w/lidocaine SOLN Take 5 mLs by mouth 4 (four) times daily as needed for mouth pain. Mila Doce  mL 1  . Multiple Vitamin (MULTIVITAMIN) tablet Take 1 tablet by mouth daily.    . ondansetron (ZOFRAN) 8 MG tablet Take 8 mg by mouth every 8 (eight) hours as needed for nausea or vomiting.     . predniSONE (DELTASONE) 5 MG tablet Take 5 mg by mouth 2 (two) times daily with a meal.     . tamsulosin (FLOMAX) 0.4 MG CAPS capsule Take 0.4 mg by mouth 2 (two) times daily.      No current facility-administered medications for this visit.    Facility-Administered Medications Ordered in Other Visits  Medication Dose Route Frequency Provider Last Rate Last Dose  . 0.9 %  sodium chloride infusion   Intravenous Once Wyatt Portela, MD      . dexamethasone  (DECADRON) injection 10 mg  10 mg Intravenous Once Wyatt Portela, MD      . DOCEtaxel (TAXOTERE) 170 mg in sodium chloride 0.9 % 250 mL chemo infusion  75 mg/m2 (Treatment Plan Recorded) Intravenous Once Wyatt Portela, MD        SURGICAL HISTORY:  Past Surgical History:  Procedure Laterality Date  . COLONOSCOPY    . ELBOW SURGERY    . LUMBAR LAMINECTOMY  Late 1990's  . NEUROPLASTY / TRANSPOSITION MEDIAN NERVE AT CARPAL TUNNEL BILATERAL    . PROSTATE BIOPSY      REVIEW OF SYSTEMS:   Review of Systems  Constitutional: Negative for appetite change, chills, fatigue, fever and unexpected weight change.  HENT:   Negative for mouth sores, nosebleeds, sore throat and trouble swallowing.   Eyes: Negative for eye problems and icterus.  Respiratory: Negative for cough, hemoptysis, shortness of breath and wheezing.   Cardiovascular: Negative for chest pain and leg swelling.  Gastrointestinal: Negative for abdominal pain, constipation, diarrhea, nausea and vomiting.  Genitourinary: Negative for bladder incontinence, difficulty urinating, dysuria, frequency and hematuria.   Musculoskeletal: Negative for back pain, gait problem, neck pain and neck stiffness.  Skin: Negative for itching and rash.  Neurological: Negative for dizziness, extremity weakness, gait problem, headaches, light-headedness and seizures.  Hematological: Negative for adenopathy. Does not bruise/bleed easily.  Psychiatric/Behavioral: Negative for confusion, depression and sleep disturbance. The patient is not nervous/anxious.     PHYSICAL EXAMINATION:  Blood pressure (!) 145/88, pulse 61, temperature 98 F (36.7 C), temperature source Oral, resp. rate 18, height 5\' 10"  (1.778 m), weight 254 lb 6.4 oz (115.4 kg), SpO2 98 %.  ECOG PERFORMANCE STATUS: 1 - Symptomatic but completely ambulatory  Physical Exam  Constitutional: Oriented to person, place, and time and well-developed, well-nourished, and in no distress. No  distress.  HENT:  Head: Normocephalic and atraumatic.  Mouth/Throat: Oropharynx is clear and moist. No oropharyngeal exudate.  Eyes: Conjunctivae are normal. Right eye exhibits no discharge. Left eye exhibits no discharge. No scleral icterus.  Neck: Normal range of motion. Neck supple.  Cardiovascular: Normal rate, regular rhythm, normal heart sounds and intact distal pulses.   Pulmonary/Chest: Effort normal and breath sounds normal. No respiratory distress. No wheezes. No rales.  Abdominal: Soft. Bowel sounds are normal. Exhibits no distension and no mass. There is no tenderness.  Musculoskeletal: Normal range of motion. Exhibits no edema.  Lymphadenopathy:    No cervical adenopathy.  Neurological: Alert and oriented to person, place, and time. Exhibits normal muscle tone. Gait normal. Coordination normal.  Skin: Skin is warm and dry. No rash noted. Not diaphoretic. No erythema. No pallor.  Psychiatric: Mood, memory and judgment normal.  Vitals  reviewed.  LABORATORY DATA: Lab Results  Component Value Date   WBC 17.7 (H) 12/04/2017   HGB 12.2 (L) 12/04/2017   HCT 36.7 (L) 12/04/2017   MCV 93.4 12/04/2017   PLT 322 12/04/2017      Chemistry      Component Value Date/Time   NA 135 (L) 12/04/2017 0844   NA 135 (L) 06/13/2014 0944   K 4.2 12/04/2017 0844   K 5.4 (H) 06/13/2014 0944   CL 104 12/04/2017 0844   CO2 21 (L) 12/04/2017 0844   CO2 25 06/13/2014 0944   BUN 21 12/04/2017 0844   BUN 18.8 06/13/2014 0944   CREATININE 0.86 12/04/2017 0844   CREATININE 1.0 06/13/2014 0944      Component Value Date/Time   CALCIUM 10.0 12/04/2017 0844   CALCIUM 10.0 06/13/2014 0944   ALKPHOS 129 12/04/2017 0844   ALKPHOS 96 06/13/2014 0944   AST 13 12/04/2017 0844   AST 104 (H) 06/13/2014 0944   ALT 27 12/04/2017 0844   ALT 126 (H) 06/13/2014 0944   BILITOT 0.3 12/04/2017 0844   BILITOT 0.63 06/13/2014 0944       RADIOGRAPHIC STUDIES:  Dg Chest 2 View  Result Date:  11/18/2017 CLINICAL DATA:  62 y/o M; fever since this afternoon. Patient receiving chemotherapy for prostate cancer. EXAM: CHEST - 2 VIEW COMPARISON:  10/30/2017 chest radiograph FINDINGS: Stable cardiac silhouette within normal limits given projection and technique. Aortic atherosclerosis with calcification. Clear lungs. No pleural effusion or pneumothorax. No acute osseous abnormality is evident. IMPRESSION: No acute pulmonary process identified.  Aortic atherosclerosis. Electronically Signed   By: Kristine Garbe M.D.   On: 11/18/2017 21:48     ASSESSMENT/PLAN:   62 year old man with the following issues:  1.Castration resistant metastatic prostate cancer with disease to the bone.  He was initially diagnosed in July of 2015 with a PSA of 6.4 and Gleason score 4+4 equals 8.   He is status post multiple therapies outlined above and currently receiving Taxotere chemotherapy.    His first cycle was complicated by neutropenic fever.  Neulasta was added with second cycle.  He again developed fever but no neutropenia.  Source of infection was not identified. Risks and benefits of continuing systemic chemotherapy was discussed today.  Complications include neutropenia, neutropenic sepsis, peripheral neuropathy, fatigue among others were reviewed.  He is agreeable to proceed.    Recommend for him to proceed with cycle 3 as scheduled today.  The plan is to complete 10 cycles of therapy if he can tolerate it.  2.  IV access: Risks and benefits of Port-A-Cath insertion was reviewed today.  For the time being he would like to defer that option and use peripheral veins.  He understands if his peripheral veins are not sufficient in the future, he will require a Port-A-Cath insertion.  3.  Antiemetics: Prescription for Zofran is available to him.  4.  Neutropenia prophylaxis: Risks and benefits of growth factor support was reviewed today.  He is agreeable to receive it and complications that  include arthralgias, myalgias and injection related issues were reviewed.  Given the neutropenic fever that occurred following cycle 1, I recommend to give this injection after each cycle of therapy.  5.  Androgen depravation: I recommended continuing his androgen deprivation therapy long-term.  Future injections will be arranged here based on his wishes.  6.  Bone directed therapy: He will be a good candidate for Xgeva after obtaining dental clearance.  He will require calcium  and vitamin D supplements before the start of therapy.  I will continue to address with him this issue in the future.  7. Follow-up: We will be in 3 weeks for the next cycle of Taxotere chemotherapy.  No orders of the defined types were placed in this encounter.  Mikey Bussing, DNP, AGPCNP-BC, AOCNP 12/04/17

## 2017-12-05 ENCOUNTER — Inpatient Hospital Stay: Payer: 59

## 2017-12-05 VITALS — BP 137/83 | HR 64 | Temp 98.2°F | Resp 18

## 2017-12-05 DIAGNOSIS — C61 Malignant neoplasm of prostate: Secondary | ICD-10-CM | POA: Diagnosis not present

## 2017-12-05 LAB — PROSTATE-SPECIFIC AG, SERUM (LABCORP): Prostate Specific Ag, Serum: 21 ng/mL — ABNORMAL HIGH (ref 0.0–4.0)

## 2017-12-05 MED ORDER — PEGFILGRASTIM-CBQV 6 MG/0.6ML ~~LOC~~ SOSY
PREFILLED_SYRINGE | SUBCUTANEOUS | Status: AC
  Filled 2017-12-05: qty 0.6

## 2017-12-05 MED ORDER — PEGFILGRASTIM-CBQV 6 MG/0.6ML ~~LOC~~ SOSY
6.0000 mg | PREFILLED_SYRINGE | Freq: Once | SUBCUTANEOUS | Status: AC
  Administered 2017-12-05: 6 mg via SUBCUTANEOUS

## 2017-12-09 ENCOUNTER — Telehealth: Payer: Self-pay | Admitting: *Deleted

## 2017-12-09 ENCOUNTER — Encounter: Payer: Self-pay | Admitting: *Deleted

## 2017-12-09 NOTE — Telephone Encounter (Signed)
Voicemail received requesting "Last weeks lab results before chemotherapy".  Message forwarded to collaborative for further patient communication.

## 2017-12-18 ENCOUNTER — Other Ambulatory Visit: Payer: Self-pay | Admitting: *Deleted

## 2017-12-18 MED ORDER — MAGIC MOUTHWASH W/LIDOCAINE: 5.0000 mL | Freq: Four times a day (QID) | ORAL | 1 refills | Status: DC | PRN

## 2017-12-24 ENCOUNTER — Inpatient Hospital Stay (HOSPITAL_BASED_OUTPATIENT_CLINIC_OR_DEPARTMENT_OTHER): Payer: 59 | Admitting: Oncology

## 2017-12-24 ENCOUNTER — Inpatient Hospital Stay: Payer: 59

## 2017-12-24 ENCOUNTER — Telehealth: Payer: Self-pay

## 2017-12-24 VITALS — BP 140/83 | HR 62 | Temp 98.0°F | Resp 18 | Ht 70.0 in | Wt 257.2 lb

## 2017-12-24 DIAGNOSIS — Z923 Personal history of irradiation: Secondary | ICD-10-CM

## 2017-12-24 DIAGNOSIS — M1990 Unspecified osteoarthritis, unspecified site: Secondary | ICD-10-CM

## 2017-12-24 DIAGNOSIS — C7951 Secondary malignant neoplasm of bone: Secondary | ICD-10-CM

## 2017-12-24 DIAGNOSIS — Z192 Hormone resistant malignancy status: Secondary | ICD-10-CM | POA: Diagnosis not present

## 2017-12-24 DIAGNOSIS — C61 Malignant neoplasm of prostate: Secondary | ICD-10-CM

## 2017-12-24 DIAGNOSIS — I10 Essential (primary) hypertension: Secondary | ICD-10-CM

## 2017-12-24 DIAGNOSIS — E7800 Pure hypercholesterolemia, unspecified: Secondary | ICD-10-CM

## 2017-12-24 DIAGNOSIS — I700 Atherosclerosis of aorta: Secondary | ICD-10-CM

## 2017-12-24 DIAGNOSIS — I2510 Atherosclerotic heart disease of native coronary artery without angina pectoris: Secondary | ICD-10-CM | POA: Diagnosis not present

## 2017-12-24 DIAGNOSIS — Z79818 Long term (current) use of other agents affecting estrogen receptors and estrogen levels: Secondary | ICD-10-CM | POA: Diagnosis not present

## 2017-12-24 DIAGNOSIS — Z79899 Other long term (current) drug therapy: Secondary | ICD-10-CM

## 2017-12-24 DIAGNOSIS — R51 Headache: Secondary | ICD-10-CM

## 2017-12-24 DIAGNOSIS — F419 Anxiety disorder, unspecified: Secondary | ICD-10-CM

## 2017-12-24 DIAGNOSIS — R0602 Shortness of breath: Secondary | ICD-10-CM

## 2017-12-24 DIAGNOSIS — I350 Nonrheumatic aortic (valve) stenosis: Secondary | ICD-10-CM | POA: Diagnosis not present

## 2017-12-24 DIAGNOSIS — G4700 Insomnia, unspecified: Secondary | ICD-10-CM

## 2017-12-24 DIAGNOSIS — Z5111 Encounter for antineoplastic chemotherapy: Secondary | ICD-10-CM | POA: Diagnosis not present

## 2017-12-24 LAB — CMP (CANCER CENTER ONLY)
ALT: 27 U/L (ref 0–55)
AST: 18 U/L (ref 5–34)
Albumin: 3.9 g/dL (ref 3.5–5.0)
Alkaline Phosphatase: 120 U/L (ref 40–150)
Anion gap: 12 — ABNORMAL HIGH (ref 3–11)
BUN: 22 mg/dL (ref 7–26)
CO2: 20 mmol/L — ABNORMAL LOW (ref 22–29)
Calcium: 9.7 mg/dL (ref 8.4–10.4)
Chloride: 103 mmol/L (ref 98–109)
Creatinine: 1 mg/dL (ref 0.70–1.30)
GFR, Est AFR Am: >60 mL/min (ref >60)
GFR, Estimated: >60 mL/min (ref >60)
Glucose, Bld: 142 mg/dL — ABNORMAL HIGH (ref 70–140)
Potassium: 4.2 mmol/L (ref 3.5–5.1)
Sodium: 135 mmol/L — ABNORMAL LOW (ref 136–145)
Total Bilirubin: 0.4 mg/dL (ref 0.2–1.2)
Total Protein: 7.1 g/dL (ref 6.4–8.3)

## 2017-12-24 LAB — CBC WITH DIFFERENTIAL (CANCER CENTER ONLY)
Basophils Absolute: 0 10*3/uL (ref 0.0–0.1)
Basophils Relative: 0 %
Eosinophils Absolute: 0 10*3/uL (ref 0.0–0.5)
Eosinophils Relative: 0 %
HCT: 34 % — ABNORMAL LOW (ref 38.4–49.9)
Hemoglobin: 11.3 g/dL — ABNORMAL LOW (ref 13.0–17.1)
Lymphocytes Relative: 4 %
Lymphs Abs: 0.5 10*3/uL — ABNORMAL LOW (ref 0.9–3.3)
MCH: 31.2 pg (ref 27.2–33.4)
MCHC: 33.2 g/dL (ref 32.0–36.0)
MCV: 93.9 fL (ref 79.3–98.0)
Monocytes Absolute: 0.8 10*3/uL (ref 0.1–0.9)
Monocytes Relative: 5 %
Neutro Abs: 13.1 10*3/uL — ABNORMAL HIGH (ref 1.5–6.5)
Neutrophils Relative %: 91 %
Platelet Count: 321 10*3/uL (ref 140–400)
RBC: 3.62 MIL/uL — ABNORMAL LOW (ref 4.20–5.82)
RDW: 15 % — ABNORMAL HIGH (ref 11.0–14.6)
WBC Count: 14.4 10*3/uL — ABNORMAL HIGH (ref 4.0–10.3)

## 2017-12-24 MED ORDER — SODIUM CHLORIDE 0.9 % IV SOLN
Freq: Once | INTRAVENOUS | Status: AC
  Administered 2017-12-24: 10:00:00 via INTRAVENOUS

## 2017-12-24 MED ORDER — DEXAMETHASONE 4 MG PO TABS: 8.0000 mg | ORAL_TABLET | Freq: Two times a day (BID) | ORAL | 1 refills | Status: DC

## 2017-12-24 MED ORDER — BUTALBITAL-APAP-CAFFEINE 50-300-40 MG PO CAPS: ORAL_CAPSULE | ORAL | 1 refills | Status: DC

## 2017-12-24 MED ORDER — DEXAMETHASONE SODIUM PHOSPHATE 10 MG/ML IJ SOLN
10.0000 mg | Freq: Once | INTRAMUSCULAR | Status: AC
  Administered 2017-12-24: 10 mg via INTRAVENOUS

## 2017-12-24 MED ORDER — SODIUM CHLORIDE 0.9 % IV SOLN
75.0000 mg/m2 | Freq: Once | INTRAVENOUS | Status: AC
  Administered 2017-12-24: 170 mg via INTRAVENOUS
  Filled 2017-12-24: qty 17

## 2017-12-24 MED ORDER — PREDNISONE 5 MG PO TABS: 5.0000 mg | ORAL_TABLET | Freq: Two times a day (BID) | ORAL | 3 refills | Status: DC

## 2017-12-24 MED ORDER — DEXAMETHASONE SODIUM PHOSPHATE 10 MG/ML IJ SOLN
INTRAMUSCULAR | Status: AC
  Filled 2017-12-24: qty 1

## 2017-12-24 NOTE — Patient Instructions (Signed)
[facility] Discharge Instructions for Patients Receiving Chemotherapy  Today you received the following chemotherapy agents: Taxotere  To help prevent nausea and vomiting after your treatment, we encourage you to take your nausea medication as directed.    If you develop nausea and vomiting that is not controlled by your nausea medication, call the clinic.   BELOW ARE SYMPTOMS THAT SHOULD BE REPORTED IMMEDIATELY:  *FEVER GREATER THAN 100.5 F  *CHILLS WITH OR WITHOUT FEVER  NAUSEA AND VOMITING THAT IS NOT CONTROLLED WITH YOUR NAUSEA MEDICATION  *UNUSUAL SHORTNESS OF BREATH  *UNUSUAL BRUISING OR BLEEDING  TENDERNESS IN MOUTH AND THROAT WITH OR WITHOUT PRESENCE OF ULCERS  *URINARY PROBLEMS  *BOWEL PROBLEMS  UNUSUAL RASH Items with * indicate a potential emergency and should be followed up as soon as possible.  Feel free to call the clinic should you have any questions or concerns. The clinic phone number is [phone].  Please show the CHEMO ALERT CARD at check-in to the Emergency Department and triage nurse.   

## 2017-12-24 NOTE — Progress Notes (Signed)
Hematology and Oncology Follow Up Visit  Alejandro [name] [id number] Oct 08, 1955 62 y.o. 12/24/2017 9:56 AM Alejandro [name], MDRusso, Alejandro Reichmann, MD   Principle Diagnosis: 62 year old with castration-resistant prostate cancer with disease to the bone.  He was diagnosed with PSA of 6.4 and a Gleason score 4+4 equals 8 high volume disease in July 2015.    Prior Therapy:   Status post biopsy done on July of 2015.  He is status post lymph node dissection completed in 2015 at [facility].  He was treated there with definitive radiation therapy utilizing seed implant as well as external beam radiation completed in 2016.  He developed advanced disease with bone involvement.  He received radiation therapy to the pelvis in 2017.  Xtandi 160 mg daily that was poorly tolerated and was discontinued.  He was switched to [name] which she took until 2019 and developed progression of disease with bony metastasis as well as PSA of 80.   Current therapy: Taxotere chemotherapy cycle 1 given in March 2019.  He is here for evaluation prior to cycle 4 of therapy  Interim History:  Alejandro [name] today for a follow-up.  Since the last visit, he received the last cycle of chemotherapy without any need for hospitalization.  He did report a few issues including increased fatigue and migraine headaches.  He reports Fioricet have helped his symptoms of headaches.  He denies any fevers, chills or sweats.  He denies any worsening neuropathy.  His appetite and performance status not dramatically changed.  His quality of life remains intact.  He does report a longer time to recovery since the last chemotherapy.   He has not reported any blurry vision vision or double vision. Has not reported any syncope or seizures. He does not report any fevers chills or sweats.  He denies any chest pain, palpitation, orthopnea or leg edema.  He does not report any cough, wheezing or hemoptysis.  Does not report any  nausea vomiting or abdominal pain. Does not report any hematochezia or melena. He did not report any bone pain or pathological fractures. He does not report any lymphadenopathy or petechiae. Does not report any clotting or thrombosis.  Does not report any anxiety or depression.  Since his review of systems is negative.    Medications: I have reviewed the patient's current medications.  Current Outpatient Medications  Medication Sig Dispense Refill  . Alirocumab (PRALUENT ) Inject 75 mg into the skin every 14 (fourteen) days.     . ALPRAZolam (XANAX) 0.5 MG tablet Take 0.5 mg by mouth at bedtime as needed for sleep.     Marland Kitchen amLODipine (NORVASC) 5 MG tablet     . aspirin 325 MG EC tablet Take 325 mg by mouth daily.    Marland Kitchen BANOPHEN 12.5 MG/5ML liquid     . BENICAR 40 MG tablet Take 1 tablet by mouth daily.    Marland Kitchen BYSTOLIC 10 MG tablet Take 10 mg by mouth every evening.   4  . dexamethasone (DECADRON) 4 MG tablet Take 2 tablets (8 mg total) by mouth 2 (two) times daily. Take 2 tablets BID for 3 Days before chemo, the day of chemo and the day after chemo 36 tablet 1  . ezetimibe (ZETIA) 10 MG tablet Take 10 mg by mouth daily.    Marland Kitchen loratadine (CLARITIN) 10 MG tablet Take 10 mg by mouth daily.    Marland Kitchen losartan (COZAAR) 50 MG tablet     . magic mouthwash w/lidocaine SOLN Take  5 mLs by mouth 4 (four) times daily as needed for mouth pain. 240 mL 1  . Multiple Vitamin (MULTIVITAMIN) tablet Take 1 tablet by mouth daily.    . ondansetron (ZOFRAN) 8 MG tablet Take 8 mg by mouth every 8 (eight) hours as needed for nausea or vomiting.     . predniSONE (DELTASONE) 5 MG tablet Take 1 tablet (5 mg total) by mouth 2 (two) times daily with a meal. 120 tablet 3  . tamsulosin (FLOMAX) 0.4 MG CAPS capsule Take 0.4 mg by mouth 2 (two) times daily.     . Butalbital-APAP-Caffeine (FIORICET) 50-300-40 MG CAPS Take one or two tablets every 4 hours as needed. 84 capsule 1   No current facility-administered medications for this  visit.      Allergies:  Allergies  Allergen Reactions  . Codeine Nausea Only    Past Medical History, Surgical history, Social history, and Family History were reviewed and updated.   Physical Exam: Blood pressure 140/83, pulse 62, temperature 98 F (36.7 C), temperature source Oral, resp. rate 18, height 5\' 10"  (1.778 m), weight 257 lb 3.2 oz (116.7 kg), SpO2 98 %. ECOG: 0 General appearance: Alert, awake gentleman without distress. Head: Normocephalic without abnormalities. Eyes: Nipples are equal and round reactive to light. Oropharynx: Mucous membranes are moist and pink. Lymph nodes: No lymphadenopathy noted in the cervical, supraclavicular, and axillary nodes Heart: Regular rate that any murmurs rubs or gallops. Lung: Clear in all lung fields without any rhonchi, wheezes or dullness to percussion. Abdomin: Soft, without any rebound or guarding.  No shifting dullness or ascites.. Musculoskeletal: No clubbing or cyanosis. Skin: No ecchymosis or petechiae. Neurological: No motor or sensory deficits.   Lab Results: Lab Results  Component Value Date   WBC 14.4 (H) 12/24/2017   HGB 11.3 (L) 12/24/2017   HCT 34.0 (L) 12/24/2017   MCV 93.9 12/24/2017   PLT 321 12/24/2017     Chemistry      Component Value Date/Time   NA 135 (L) 12/24/2017 0855   NA 135 (L) 06/13/2014 0944   K 4.2 12/24/2017 0855   K 5.4 (H) 06/13/2014 0944   CL 103 12/24/2017 0855   CO2 20 (L) 12/24/2017 0855   CO2 25 06/13/2014 0944   BUN 22 12/24/2017 0855   BUN 18.8 06/13/2014 0944   CREATININE 1.00 12/24/2017 0855   CREATININE 1.0 06/13/2014 0944      Component Value Date/Time   CALCIUM 9.7 12/24/2017 0855   CALCIUM 10.0 06/13/2014 0944   ALKPHOS 120 12/24/2017 0855   ALKPHOS 96 06/13/2014 0944   AST 18 12/24/2017 0855   AST 104 (H) 06/13/2014 0944   ALT 27 12/24/2017 0855   ALT 126 (H) 06/13/2014 0944   BILITOT 0.4 12/24/2017 0855   BILITOT 0.63 06/13/2014 0944     Results for  Alejandro [name] (MRN [id number]) as of 12/24/2017 09:57  Ref. Range 11/11/2017 10:38 12/04/2017 08:44  Prostate Specific Ag, Serum Latest Ref Range: 0.0 - 4.0 ng/mL 48.7 (H) 21.0 (H)   Impression and Plan:  62 year old man with the:  1.Castration-resistant metastatic prostate cancer with disease to the bone.  He is status post the treatment outlined above.  He is currently receiving Taxotere chemotherapy with excellent response with PSA criteria.  He has tolerated chemotherapy with few complications but these have been manageable.  Risks and benefits of continuing chemotherapy of the same dose and schedule was discussed today and is agreeable to continue.  Duration of  therapy was also reviewed today.  The goal is to get him to 10 cycles of therapy if he can tolerate it.  For the time being he is agreeable to 6 and we will continue to monitor him closely after each cycle.  His PSA showed a 50% response and decline after each cycle of therapy.  His laboratory data were personally reviewed today and no abnormalities noted.  2.  IV access: Femoral veins are used for the time being.  I offered him a Port-A-Cath at this time and he continues to decline.  3.  Antiemetics: Nausea has been managed reasonably well.  He does take dexamethasone for 3 days around chemotherapy which have helped his symptoms.  4.  Neutropenia prophylaxis: He is receiving Udenyca after each cycle of chemotherapy.  No neutropenia or sepsis noted at this time.  5.  Androgen depravation: He received Lupron at 30 mg on Dec 04, 2017.  Risks and benefits of continuing this indefinitely was reviewed today is agreeable to continue.  6.  Bone directed therapy: He is at an increased risk for skeletal related events.  Delton See might help with the symptoms in the future.  Will await dental clearance before considering this option for the time being.  7.  Headaches: Prescription for Fioricet was made available to him.  Headaches are  related to chemotherapy and likely antiemetics.  8. Follow-up: We will be in 3 weeks for the next cycle of Taxotere chemotherapy.  25 minutes was spent with the patient face-to-face today.  More than 50% of time was dedicated to patient counseling, education and discussing future plan of care as well as the natural course of this disease.       Zola Button, MD 5/23/20199:56 AM

## 2017-12-24 NOTE — Telephone Encounter (Signed)
Printed avs and calender of upcoming appointment. Per 5/23 los 

## 2017-12-25 ENCOUNTER — Telehealth: Payer: Self-pay | Admitting: *Deleted

## 2017-12-25 ENCOUNTER — Inpatient Hospital Stay: Payer: 59

## 2017-12-25 DIAGNOSIS — C61 Malignant neoplasm of prostate: Secondary | ICD-10-CM | POA: Diagnosis not present

## 2017-12-25 LAB — PROSTATE-SPECIFIC AG, SERUM (LABCORP): Prostate Specific Ag, Serum: 20.8 ng/mL — ABNORMAL HIGH (ref 0.0–4.0)

## 2017-12-25 MED ORDER — PEGFILGRASTIM-CBQV 6 MG/0.6ML ~~LOC~~ SOSY
PREFILLED_SYRINGE | SUBCUTANEOUS | Status: AC
  Filled 2017-12-25: qty 0.6

## 2017-12-25 MED ORDER — PEGFILGRASTIM-CBQV 6 MG/0.6ML ~~LOC~~ SOSY
6.0000 mg | PREFILLED_SYRINGE | Freq: Once | SUBCUTANEOUS | Status: AC
Start: 2017-12-25 — End: 2017-12-25
  Administered 2017-12-25: 6 mg via SUBCUTANEOUS

## 2017-12-25 NOTE — Telephone Encounter (Signed)
-----   Message from Firas N Shadad, MD sent at 12/25/2017  8:10 AM EDT ----- Please let him know his PSA is down. 

## 2017-12-25 NOTE — Telephone Encounter (Signed)
Spoke with patient. Gave results of last PSA 

## 2017-12-29 ENCOUNTER — Emergency Department (HOSPITAL_COMMUNITY)
Admission: EM | Admit: 2017-12-29 | Discharge: 2017-12-29 | Disposition: A | Discharge status: Home / Self Care | Payer: 59 | Attending: Emergency Medicine | Admitting: Emergency Medicine

## 2017-12-29 ENCOUNTER — Encounter (HOSPITAL_COMMUNITY): Payer: Self-pay

## 2017-12-29 ENCOUNTER — Other Ambulatory Visit: Payer: Self-pay

## 2017-12-29 ENCOUNTER — Emergency Department (HOSPITAL_COMMUNITY): Payer: 59

## 2017-12-29 DIAGNOSIS — Z79899 Other long term (current) drug therapy: Secondary | ICD-10-CM | POA: Insufficient documentation

## 2017-12-29 DIAGNOSIS — I2510 Atherosclerotic heart disease of native coronary artery without angina pectoris: Secondary | ICD-10-CM | POA: Diagnosis not present

## 2017-12-29 DIAGNOSIS — Z87891 Personal history of nicotine dependence: Secondary | ICD-10-CM | POA: Diagnosis not present

## 2017-12-29 DIAGNOSIS — G4489 Other headache syndrome: Secondary | ICD-10-CM | POA: Insufficient documentation

## 2017-12-29 DIAGNOSIS — R509 Fever, unspecified: Secondary | ICD-10-CM | POA: Insufficient documentation

## 2017-12-29 DIAGNOSIS — C7951 Secondary malignant neoplasm of bone: Secondary | ICD-10-CM | POA: Diagnosis not present

## 2017-12-29 DIAGNOSIS — C61 Malignant neoplasm of prostate: Secondary | ICD-10-CM | POA: Diagnosis not present

## 2017-12-29 DIAGNOSIS — I10 Essential (primary) hypertension: Secondary | ICD-10-CM | POA: Insufficient documentation

## 2017-12-29 LAB — CBC WITH DIFFERENTIAL/PLATELET
Basophils Absolute: 0 10*3/uL (ref 0.0–0.1)
Basophils Relative: 1 %
Eosinophils Absolute: 0.2 10*3/uL (ref 0.0–0.7)
Eosinophils Relative: 4 %
HCT: 35.6 % — ABNORMAL LOW (ref 39.0–52.0)
Hemoglobin: 11.7 g/dL — ABNORMAL LOW (ref 13.0–17.0)
Lymphocytes Relative: 17 %
Lymphs Abs: 0.7 10*3/uL (ref 0.7–4.0)
MCH: 31.7 pg (ref 26.0–34.0)
MCHC: 32.9 g/dL (ref 30.0–36.0)
MCV: 96.5 fL (ref 78.0–100.0)
Monocytes Absolute: 0.4 10*3/uL (ref 0.1–1.0)
Monocytes Relative: 9 %
Neutro Abs: 3.1 10*3/uL (ref 1.7–7.7)
Neutrophils Relative %: 69 %
Platelets: 204 10*3/uL (ref 150–400)
RBC: 3.69 MIL/uL — ABNORMAL LOW (ref 4.22–5.81)
RDW: 14.7 % (ref 11.5–15.5)
WBC: 4.4 10*3/uL (ref 4.0–10.5)

## 2017-12-29 LAB — URINALYSIS, ROUTINE W REFLEX MICROSCOPIC
Bacteria, UA: NONE SEEN
Bilirubin Urine: NEGATIVE
Glucose, UA: NEGATIVE mg/dL
Hgb urine dipstick: NEGATIVE
Ketones, ur: NEGATIVE mg/dL
Leukocytes, UA: NEGATIVE
Nitrite: NEGATIVE
Protein, ur: 30 mg/dL — AB
Specific Gravity, Urine: 1.015 (ref 1.005–1.030)
pH: 6 (ref 5.0–8.0)

## 2017-12-29 LAB — COMPREHENSIVE METABOLIC PANEL
ALT: 26 U/L (ref 17–63)
AST: 20 U/L (ref 15–41)
Albumin: 4 g/dL (ref 3.5–5.0)
Alkaline Phosphatase: 119 U/L (ref 38–126)
Anion gap: 13 (ref 5–15)
BUN: 12 mg/dL (ref 6–20)
CO2: 22 mmol/L (ref 22–32)
Calcium: 9.1 mg/dL (ref 8.9–10.3)
Chloride: 101 mmol/L (ref 101–111)
Creatinine, Ser: 0.83 mg/dL (ref 0.61–1.24)
GFR calc Af Amer: >60 mL/min (ref >60)
GFR calc non Af Amer: >60 mL/min (ref >60)
Glucose, Bld: 129 mg/dL — ABNORMAL HIGH (ref 65–99)
Potassium: 4 mmol/L (ref 3.5–5.1)
Sodium: 136 mmol/L (ref 135–145)
Total Bilirubin: 0.9 mg/dL (ref 0.3–1.2)
Total Protein: 7.1 g/dL (ref 6.5–8.1)

## 2017-12-29 LAB — PROTIME-INR
INR: 1.01
Prothrombin Time: 13.2 seconds (ref 11.4–15.2)

## 2017-12-29 LAB — I-STAT CG4 LACTIC ACID, ED
Lactic Acid, Venous: 2.04 mmol/L (ref 0.5–1.9)
Lactic Acid, Venous: 1.18 mmol/L (ref 0.5–1.9)

## 2017-12-29 MED ORDER — SODIUM CHLORIDE 0.9 % IV BOLUS
1000.0000 mL | Freq: Once | INTRAVENOUS | Status: AC
  Administered 2017-12-29: 1000 mL via INTRAVENOUS

## 2017-12-29 MED ORDER — BUTALBITAL-APAP-CAFFEINE 50-325-40 MG PO TABS
1.0000 | ORAL_TABLET | Freq: Once | ORAL | Status: AC
  Administered 2017-12-29: 1 via ORAL
  Filled 2017-12-29: qty 1

## 2017-12-29 NOTE — ED Triage Notes (Signed)
Patient states he had a temp 102 at home today. Patient also c/o headache. Last chemo treatment was 6 days ago.

## 2017-12-29 NOTE — Discharge Instructions (Addendum)
Call Dr. Hazeline Junker office at 8:30 am in the morning to arrange follow-up.

## 2017-12-29 NOTE — ED Notes (Signed)
I gave critical I Stat CG4 results to MD Regenia Skeeter

## 2017-12-29 NOTE — ED Provider Notes (Signed)
[facility] DEPT Provider Note   CSN: [id number] Arrival date & time: 12/29/17  [facility]     History   Chief Complaint Chief Complaint  Patient presents with  . Fever  . cancer patient    HPI Alejandro [name]. is a 62 y.o. male.  HPI Patient with history of prostate cancer undergoing chemotherapy.  States received chemotherapy 3 days ago.  Since that time he has had fatigue and frontal headache with congestion.  Had fever to 103 today.  Called his oncologist office and referred to the emergency department.  Denies visual changes, sore throat, cough, shortness of breath.  No vomiting, diarrhea or urinary symptoms.  Denies neck pain or stiffness.  No focal weakness or numbness though states he does have sensation that his left knee will give out. Past Medical History:  Diagnosis Date  . ANXIETY   . AORTIC STENOSIS, MILD   . CAD   . CHEST PAIN-UNSPECIFIED   . DYSPNEA   . HYPERCHOLESTEROLEMIA   . Hypertension   . INSOMNIA   . OSA on CPAP 05/31/2013  . OSTEOARTHRITIS, KNEE   . Prostate cancer [facility])     Patient Active Problem List   Diagnosis Date Noted  . Encounter for antineoplastic chemotherapy 12/04/2017  . Prostate cancer metastatic to bone ([place]) 11/19/2017  . Fever in adult 11/19/2017  . Mucositis due to antineoplastic therapy 11/19/2017  . Lactic acid acidosis 11/19/2017  . Fever 11/19/2017  . Neutropenic fever ([place]) 10/30/2017  . 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 OSA on CPAP 05/31/2013  . DYSPNEA 01/23/2010  . CHEST PAIN-UNSPECIFIED 01/23/2010  . ANXIETY 12/10/2009  . OSTEOARTHRITIS, KNEE 12/10/2009  . INSOMNIA 12/10/2009  . HYPERCHOLESTEROLEMIA 12/07/2009  . Essential hypertension 12/07/2009  . Coronary atherosclerosis 12/07/2009  . AORTIC STENOSIS, MILD 12/07/2009    Past Surgical History:  Procedure Laterality Date  . COLONOSCOPY    .  ELBOW SURGERY    . LUMBAR LAMINECTOMY  Late 1990's  . NEUROPLASTY / TRANSPOSITION MEDIAN NERVE AT CARPAL TUNNEL BILATERAL    . PROSTATE BIOPSY          Home Medications    Prior to Admission medications   Medication Sig Start Date End Date Taking? Authorizing Provider  ALPRAZolam Duanne Moron) 0.5 MG tablet Take 0.5 mg by mouth at bedtime.  09/13/17  Yes [provider]  amLODipine (NORVASC) 5 MG tablet Take 5 mg by mouth daily.  12/13/17  Yes [provider]  aspirin 325 MG EC tablet Take 325 mg by mouth daily.   Yes [provider]  Butalbital-APAP-Caffeine (FIORICET) 50-300-40 MG CAPS Take one or two tablets every 4 hours as needed. Patient taking differently: Take 1 tablet by mouth every 4 (four) hours as needed (headache). Take one or two tablets every 4 hours as needed. 12/24/17  Yes Shadad, Mathis Dad, MD  BYSTOLIC 10 MG tablet Take 10 mg by mouth every evening.  06/06/14  Yes [provider]  ezetimibe (ZETIA) 10 MG tablet Take 10 mg by mouth daily.   Yes [provider]  loratadine (CLARITIN) 10 MG tablet Take 10 mg by mouth daily.   Yes [provider]  losartan (COZAAR) 50 MG tablet Take 50 mg by mouth daily.  12/04/17  Yes [provider]  magic mouthwash w/lidocaine SOLN Take 5 mLs by mouth 4 (four) times daily as needed for mouth  pain. 12/18/17  Yes Wyatt Portela, MD  predniSONE (DELTASONE) 5 MG tablet Take 1 tablet (5 mg total) by mouth 2 (two) times daily with a meal. 12/24/17  Yes Shadad, Mathis Dad, MD  tamsulosin (FLOMAX) 0.4 MG CAPS capsule Take 0.4 mg by mouth 2 (two) times daily.  03/17/14  Yes [provider]  Alirocumab (PRALUENT [place]) Inject 75 mg into the skin every 14 (fourteen) days.     [provider]  dexamethasone (DECADRON) 4 MG tablet Take 2 tablets (8 mg total) by mouth 2 (two) times daily. Take 2 tablets BID for 3 Days before chemo, the day of chemo and the day after chemo 12/24/17   Wyatt Portela, MD  ondansetron (ZOFRAN) 8 MG tablet Take 8 mg by mouth every 8 (eight) hours as needed for nausea or vomiting.  10/19/17   [provider]    Family History Family History  Problem Relation Age of Onset  . Cancer Father        prostate    Social History Social History   Tobacco Use  . Smoking status: Former Smoker    Packs/day: 3.00    Years: 25.00    Pack years: 75.00    Types: Cigars, Cigarettes  . Smokeless tobacco: Never Used  . Tobacco comment: Former 3 ppd smoker for 30 years quit heavy smoking 15 years ago  Substance Use Topics  . Alcohol use: Yes    Alcohol/week: 6.0 oz    Types: 12 Standard drinks or equivalent per week    Comment: 2 drinks per day  . Drug use: No     Allergies   Codeine   Review of Systems Review of Systems  Constitutional: Positive for fatigue and fever. Negative for chills.  HENT: Positive for congestion, sinus pressure and sinus pain. Negative for sore throat.   Eyes: Negative for visual disturbance.  Respiratory: Negative for cough and shortness of breath.   Cardiovascular: Negative for chest pain, palpitations and leg swelling.  Gastrointestinal: Negative for abdominal pain, diarrhea, nausea and vomiting.  Genitourinary: Negative for dysuria, flank pain, frequency and hematuria.  Musculoskeletal: Negative for back pain, myalgias, neck pain and neck stiffness.  Skin: Negative for rash and wound.  Neurological: Positive for headaches. Negative for dizziness, weakness, light-headedness and numbness.  All other systems reviewed and are negative.    Physical Exam Updated Vital Signs BP (!) 163/98 (BP Location: Left Arm)   Pulse 66   Temp 99.1 F (37.3 C) (Oral)   Resp 16   Ht 5\' 10"  (1.778 m)   Wt 113.4 kg (250 lb 1.6 oz)   SpO2 95%   BMI 35.89 kg/m   Physical Exam  Constitutional: He is oriented to person, place, and time. He appears well-developed and well-nourished.  HENT:  Head: Normocephalic and atraumatic.   Mouth/Throat: Oropharynx is clear and moist. No oropharyngeal exudate.  Nasal mucosal edema.  Mild tenderness of the frontal and right maxillary sinus.  Eyes: Pupils are equal, round, and reactive to light. Conjunctivae and EOM are normal.  Neck: Normal range of motion. Neck supple.  No meningismus.  Cardiovascular: Normal rate and regular rhythm. Exam reveals no gallop and no friction rub.  No murmur heard. Pulmonary/Chest: Effort normal and breath sounds normal. No stridor. No respiratory distress. He has no wheezes. He has no rales. He exhibits no tenderness.  Abdominal: Soft. Bowel sounds are normal. There is no tenderness. There is no rebound and no guarding.  Musculoskeletal:  Normal range of motion. He exhibits no edema or tenderness.  No midline thoracic or lumbar tenderness.  No CVA tenderness.  Left knee with full range of motion.  No obvious effusions, erythema or warmth.  No ligamentous instability.  Distal pulses intact.  Lymphadenopathy:    He has no cervical adenopathy.  Neurological: He is alert and oriented to person, place, and time.  Moving all extremities without focal deficit.  Sensation fully intact.  Skin: Skin is warm and dry. Capillary refill takes less than 2 seconds. No rash noted. No erythema.  Psychiatric: He has a normal mood and affect. His behavior is normal.  Nursing note and vitals reviewed.    ED Treatments / Results  Labs (all labs ordered are listed, but only abnormal results are displayed) Labs Reviewed  COMPREHENSIVE METABOLIC PANEL - Abnormal; Notable for the following components:      Result Value   Glucose, Bld 129 (*)    All other components within normal limits  CBC WITH DIFFERENTIAL/PLATELET - Abnormal; Notable for the following components:   RBC 3.69 (*)    Hemoglobin 11.7 (*)    HCT 35.6 (*)    All other components within normal limits  URINALYSIS, ROUTINE W REFLEX MICROSCOPIC - Abnormal; Notable for the following components:    Protein, ur 30 (*)    All other components within normal limits  I-STAT CG4 LACTIC ACID, ED - Abnormal; Notable for the following components:   Lactic Acid, Venous 2.04 (*)    All other components within normal limits  CULTURE, BLOOD (ROUTINE X 2)  CULTURE, BLOOD (ROUTINE X 2)  PROTIME-INR  I-STAT CG4 LACTIC ACID, ED    EKG None  Radiology Dg Chest 2 View  Result Date: 12/29/2017 CLINICAL DATA:  Fever, headache, metastatic prostate cancer EXAM: CHEST - 2 VIEW COMPARISON:  11/18/2017 FINDINGS: Lungs are essentially clear. No focal consolidation. No pleural effusion or pneumothorax. The heart is normal in size. Mild degenerative changes of the visualized thoracolumbar spine. IMPRESSION: No evidence of acute cardiopulmonary disease. Electronically Signed   By: Julian Hy M.D.   On: 12/29/2017 20:32   Ct Head Wo Contrast  Result Date: 12/29/2017 CLINICAL DATA:  Acute onset of generalized weakness and fatigue after chemotherapy. EXAM: CT HEAD WITHOUT CONTRAST CT MAXILLOFACIAL WITHOUT CONTRAST TECHNIQUE: Multidetector CT imaging of the head and maxillofacial structures were performed using the standard protocol without intravenous contrast. Multiplanar CT image reconstructions of the maxillofacial structures were also generated. COMPARISON:  None. FINDINGS: CT HEAD FINDINGS Brain: No evidence of acute infarction, hemorrhage, hydrocephalus, extra-axial collection or mass lesion / mass effect. Prominence of the ventricles and sulci reflects mild to moderate cortical volume loss. Scattered periventricular white matter change likely reflects small vessel ischemic microangiopathy. Mild cerebellar atrophy is noted. The brainstem and fourth ventricle are within normal limits. The basal ganglia are unremarkable in appearance. The cerebral hemispheres demonstrate grossly normal gray-white differentiation. No mass effect or midline shift is seen. Vascular: No hyperdense vessel or unexpected  calcification. Skull: There is no evidence of fracture; visualized osseous structures are unremarkable in appearance. Other: No significant soft tissue abnormalities are seen. CT MAXILLOFACIAL FINDINGS Osseous: There is no evidence of fracture or dislocation. The maxilla and mandible appear intact. The nasal bone is unremarkable in appearance. A small dental caries is noted at the left central maxillary incisor. Orbits: The orbits are intact bilaterally. Sinuses: The visualized paranasal sinuses and mastoid air cells are well-aerated. Soft tissues: No significant soft tissue abnormalities  are seen. The parapharyngeal fat planes are preserved. The nasopharynx, oropharynx and hypopharynx are unremarkable in appearance. The visualized portions of the valleculae and piriform sinuses are grossly unremarkable. The parotid and submandibular glands are within normal limits. No cervical lymphadenopathy is seen. IMPRESSION: 1. No acute intracranial pathology seen on CT. 2. Mild to moderate cortical volume loss and scattered small vessel ischemic microangiopathy. 3. Small dental caries at the left central maxillary incisor. Electronically Signed   By: Garald Balding M.D.   On: 12/29/2017 21:52   Ct Maxillofacial Wo Contrast  Result Date: 12/29/2017 CLINICAL DATA:  Acute onset of generalized weakness and fatigue after chemotherapy. EXAM: CT HEAD WITHOUT CONTRAST CT MAXILLOFACIAL WITHOUT CONTRAST TECHNIQUE: Multidetector CT imaging of the head and maxillofacial structures were performed using the standard protocol without intravenous contrast. Multiplanar CT image reconstructions of the maxillofacial structures were also generated. COMPARISON:  None. FINDINGS: CT HEAD FINDINGS Brain: No evidence of acute infarction, hemorrhage, hydrocephalus, extra-axial collection or mass lesion / mass effect. Prominence of the ventricles and sulci reflects mild to moderate cortical volume loss. Scattered periventricular white matter change  likely reflects small vessel ischemic microangiopathy. Mild cerebellar atrophy is noted. The brainstem and fourth ventricle are within normal limits. The basal ganglia are unremarkable in appearance. The cerebral hemispheres demonstrate grossly normal gray-white differentiation. No mass effect or midline shift is seen. Vascular: No hyperdense vessel or unexpected calcification. Skull: There is no evidence of fracture; visualized osseous structures are unremarkable in appearance. Other: No significant soft tissue abnormalities are seen. CT MAXILLOFACIAL FINDINGS Osseous: There is no evidence of fracture or dislocation. The maxilla and mandible appear intact. The nasal bone is unremarkable in appearance. A small dental caries is noted at the left central maxillary incisor. Orbits: The orbits are intact bilaterally. Sinuses: The visualized paranasal sinuses and mastoid air cells are well-aerated. Soft tissues: No significant soft tissue abnormalities are seen. The parapharyngeal fat planes are preserved. The nasopharynx, oropharynx and hypopharynx are unremarkable in appearance. The visualized portions of the valleculae and piriform sinuses are grossly unremarkable. The parotid and submandibular glands are within normal limits. No cervical lymphadenopathy is seen. IMPRESSION: 1. No acute intracranial pathology seen on CT. 2. Mild to moderate cortical volume loss and scattered small vessel ischemic microangiopathy. 3. Small dental caries at the left central maxillary incisor. Electronically Signed   By: Garald Balding M.D.   On: 12/29/2017 21:52    Procedures Procedures (including critical care time)  Medications Ordered in ED Medications  sodium chloride 0.9 % bolus 1,000 mL (0 mLs Intravenous Stopped 12/29/17 2220)  butalbital-acetaminophen-caffeine (FIORICET, ESGIC) 50-325-40 MG per tablet 1 tablet (1 tablet Oral Given 12/29/17 2236)     Initial Impression / Assessment and Plan / ED Course  I have  reviewed the triage vital signs and the nursing notes.  Pertinent labs & imaging results that were available during my care of the patient were reviewed by me and considered in my medical decision making (see chart for details).     Patient is very well-appearing.  Vital signs stable.  Normal white blood cell count.  Mildly elevated lactic acidosis which has resolved after IV fluids.  No source for fever found.  Patient headache his significantly improved after Fioricet.  States these headaches are common after his chemotherapy.  Discussed with Dr. corsets.  Recommends close follow-up with Dr. Alen Blew in his office tomorrow.  Have sent Dr. Alen Blew a private message and patient understands need to call the office at  8:30 in the morning.  Strict return precautions given. Final Clinical Impressions(s) / ED Diagnoses   Final diagnoses:  Fever, unspecified fever cause  Other headache syndrome    ED Discharge Orders    None       Julianne Rice, MD 12/29/17 2308

## 2017-12-29 NOTE — ED Notes (Signed)
Pt reports generalized weakness and fatigue due to chemo. He stated that he has slept for almost 2 days straight and still does not feel any better.

## 2017-12-29 NOTE — ED Notes (Signed)
Pt. Made aware of urine sample needed. Unable to go at this time.

## 2017-12-30 ENCOUNTER — Telehealth: Payer: Self-pay | Admitting: *Deleted

## 2017-12-30 NOTE — Telephone Encounter (Signed)
As noted below by Dr. Alen Blew, I informed patient that he doesn't need an appointment. Please call [place] if he has any problems. He verbalized understanding.

## 2017-12-30 NOTE — Telephone Encounter (Signed)
Received voice mail message from patient stating,"I went to the ER last night with a fever of 103.0. They discharged me and told me I had to see Dr. Alen Blew in one day. I need to make an appointment with him. Return number is [phone].

## 2017-12-30 NOTE — Telephone Encounter (Signed)
No appointment is needed. He is to let us know if he has any problems.

## 2018-01-03 LAB — CULTURE, BLOOD (ROUTINE X 2)
Culture: NO GROWTH
Culture: NO GROWTH
Special Requests: ADEQUATE
Special Requests: ADEQUATE

## 2018-01-05 ENCOUNTER — Ambulatory Visit (INDEPENDENT_AMBULATORY_CARE_PROVIDER_SITE_OTHER): Payer: 59 | Admitting: Psychology

## 2018-01-05 DIAGNOSIS — F4323 Adjustment disorder with mixed anxiety and depressed mood: Secondary | ICD-10-CM

## 2018-01-14 ENCOUNTER — Inpatient Hospital Stay: Payer: 59 | Attending: Oncology

## 2018-01-14 ENCOUNTER — Other Ambulatory Visit: Payer: Self-pay | Admitting: *Deleted

## 2018-01-14 ENCOUNTER — Inpatient Hospital Stay: Payer: 59

## 2018-01-14 ENCOUNTER — Inpatient Hospital Stay (HOSPITAL_BASED_OUTPATIENT_CLINIC_OR_DEPARTMENT_OTHER): Payer: 59 | Admitting: Oncology

## 2018-01-14 VITALS — BP 148/93 | HR 60 | Temp 98.5°F | Resp 18 | Ht 70.0 in | Wt 259.8 lb

## 2018-01-14 DIAGNOSIS — R509 Fever, unspecified: Secondary | ICD-10-CM

## 2018-01-14 DIAGNOSIS — Z923 Personal history of irradiation: Secondary | ICD-10-CM

## 2018-01-14 DIAGNOSIS — R5383 Other fatigue: Secondary | ICD-10-CM

## 2018-01-14 DIAGNOSIS — C61 Malignant neoplasm of prostate: Secondary | ICD-10-CM

## 2018-01-14 DIAGNOSIS — Z192 Hormone resistant malignancy status: Secondary | ICD-10-CM | POA: Diagnosis not present

## 2018-01-14 DIAGNOSIS — C7951 Secondary malignant neoplasm of bone: Secondary | ICD-10-CM

## 2018-01-14 DIAGNOSIS — G4700 Insomnia, unspecified: Secondary | ICD-10-CM | POA: Insufficient documentation

## 2018-01-14 DIAGNOSIS — R630 Anorexia: Secondary | ICD-10-CM

## 2018-01-14 DIAGNOSIS — R51 Headache: Secondary | ICD-10-CM | POA: Diagnosis not present

## 2018-01-14 DIAGNOSIS — Z5111 Encounter for antineoplastic chemotherapy: Secondary | ICD-10-CM | POA: Insufficient documentation

## 2018-01-14 LAB — CBC WITH DIFFERENTIAL (CANCER CENTER ONLY)
Basophils Absolute: 0.1 10*3/uL (ref 0.0–0.1)
Basophils Relative: 0 %
Eosinophils Absolute: 0 10*3/uL (ref 0.0–0.5)
Eosinophils Relative: 0 %
HCT: 34.2 % — ABNORMAL LOW (ref 38.4–49.9)
Hemoglobin: 11.5 g/dL — ABNORMAL LOW (ref 13.0–17.1)
Lymphocytes Relative: 6 %
Lymphs Abs: 0.8 10*3/uL — ABNORMAL LOW (ref 0.9–3.3)
MCH: 32.1 pg (ref 27.2–33.4)
MCHC: 33.6 g/dL (ref 32.0–36.0)
MCV: 95.5 fL (ref 79.3–98.0)
Monocytes Absolute: 1 10*3/uL — ABNORMAL HIGH (ref 0.1–0.9)
Monocytes Relative: 7 %
Neutro Abs: 12.4 10*3/uL — ABNORMAL HIGH (ref 1.5–6.5)
Neutrophils Relative %: 87 %
Platelet Count: 317 10*3/uL (ref 140–400)
RBC: 3.58 MIL/uL — ABNORMAL LOW (ref 4.20–5.82)
RDW: 16.4 % — ABNORMAL HIGH (ref 11.0–14.6)
WBC Count: 14.3 10*3/uL — ABNORMAL HIGH (ref 4.0–10.3)

## 2018-01-14 LAB — CMP (CANCER CENTER ONLY)
ALT: 36 U/L (ref 0–55)
AST: 19 U/L (ref 5–34)
Albumin: 3.9 g/dL (ref 3.5–5.0)
Alkaline Phosphatase: 129 U/L (ref 40–150)
Anion gap: 11 (ref 3–11)
BUN: 22 mg/dL (ref 7–26)
CO2: 22 mmol/L (ref 22–29)
Calcium: 9.9 mg/dL (ref 8.4–10.4)
Chloride: 102 mmol/L (ref 98–109)
Creatinine: 0.88 mg/dL (ref 0.70–1.30)
GFR, Est AFR Am: >60 mL/min (ref >60)
GFR, Estimated: >60 mL/min (ref >60)
Glucose, Bld: 139 mg/dL (ref 70–140)
Potassium: 4.4 mmol/L (ref 3.5–5.1)
Sodium: 135 mmol/L — ABNORMAL LOW (ref 136–145)
Total Bilirubin: 0.2 mg/dL (ref 0.2–1.2)
Total Protein: 7.2 g/dL (ref 6.4–8.3)

## 2018-01-14 MED ORDER — DEXAMETHASONE SODIUM PHOSPHATE 10 MG/ML IJ SOLN
10.0000 mg | Freq: Once | INTRAMUSCULAR | Status: AC
  Administered 2018-01-14: 10 mg via INTRAVENOUS

## 2018-01-14 MED ORDER — DEXAMETHASONE SODIUM PHOSPHATE 10 MG/ML IJ SOLN
INTRAMUSCULAR | Status: AC
  Filled 2018-01-14: qty 1

## 2018-01-14 MED ORDER — SODIUM CHLORIDE 0.9 % IV SOLN
Freq: Once | INTRAVENOUS | Status: AC
  Administered 2018-01-14: 10:00:00 via INTRAVENOUS

## 2018-01-14 MED ORDER — MAGIC MOUTHWASH W/LIDOCAINE: 5.0000 mL | Freq: Four times a day (QID) | ORAL | 2 refills | Status: DC | PRN

## 2018-01-14 MED ORDER — SODIUM CHLORIDE 0.9 % IV SOLN
75.0000 mg/m2 | Freq: Once | INTRAVENOUS | Status: AC
  Administered 2018-01-14: 170 mg via INTRAVENOUS
  Filled 2018-01-14: qty 17

## 2018-01-14 NOTE — Progress Notes (Signed)
Hematology and Oncology Follow Up Visit  Alejandro [name] [id number] 12-23-1955 62 y.o. 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  He was diagnosed in 2015 after presentingwith PSA of 6.4 and a Gleason score 4+4 equals 8.    Prior Therapy:   Status post biopsy done on July of 2015.  He is status post lymph node dissection completed in 2015 at [facility].  He was treated there with definitive radiation therapy utilizing seed implant as well as external beam radiation completed in 2016.  He developed advanced disease with bone involvement.  He received radiation therapy to the pelvis in 2017.  Xtandi 160 mg daily that was poorly tolerated and was discontinued.  He was switched to [name] which she took until 2019 and developed progression of disease with bony metastasis as well as PSA of 80.   Current therapy: Taxotere chemotherapy cycle 1 given in March 2019.  He is here for evaluation prior to cycle 5 of therapy  Interim History:  Alejandro [name] is here for a follow-up.  Since the last visit, he tolerated last cycle of chemotherapy with few complications.  He reports increased fatigue and insomnia as well as issues with forgetfulness at times.  He did have one episode of fever 5 days after chemotherapy and automatically subsided.  His appetite has been poor although he is eating despite that and gained weight.  He denies any nausea or vomiting.  He denies any worsening neuropathy.  His performance status have been marginal related to chemotherapy and is not able to enjoy a lot of things he used to enjoy in the past.  Still ambulating without any difficulties.  He still able to drive without any difficulties short distances.    He has not reported any blurry vision vision or double vision. Has not reported any duration of mental status.  He does report  periodic forgetfulness.  He does not report any chills or sweats.  He denies any chest pain, palpitation, orthopnea or leg edema.  He does not report any cough, wheezing or hemoptysis.  Does not report any nausea vomiting or abdominal pain. Does not report any hematochezia or melena. He did not report any paralysis or myalgias.  He does not report any lymphadenopathy or petechiae. Does not report any skin rashes or lesions.  He does report issues with feeling down slightly depressed no anxiety.  Since his review of systems is negative.    Medications: I have reviewed the patient's current medications.  Current Outpatient Medications  Medication Sig Dispense Refill  . Alirocumab (PRALUENT [place]) Inject 75 mg into the skin every 14 (fourteen) days.     . ALPRAZolam (XANAX) 0.5 MG tablet Take 0.5 mg by mouth at bedtime.     Marland Kitchen amLODipine (NORVASC) 5 MG tablet Take 5 mg by mouth daily.     Marland Kitchen aspirin 325 MG EC tablet Take 325 mg by mouth daily.    . Butalbital-APAP-Caffeine (FIORICET) 50-300-40 MG CAPS Take one or two tablets every 4 hours as needed. (Patient taking differently: Take 1 tablet by mouth every 4 (four) hours as needed (headache). Take one or two tablets every 4 hours as needed.) 84 capsule 1  . BYSTOLIC 10 MG tablet Take 10 mg by mouth every evening.   4  . dexamethasone (DECADRON) 4 MG tablet Take 2 tablets (8 mg total) by mouth 2 (two) times daily. Take  2 tablets BID for 3 Days before chemo, the day of chemo and the day after chemo 36 tablet 1  . ezetimibe (ZETIA) 10 MG tablet Take 10 mg by mouth daily.    Marland Kitchen loratadine (CLARITIN) 10 MG tablet Take 10 mg by mouth daily.    Marland Kitchen losartan (COZAAR) 50 MG tablet Take 50 mg by mouth daily.     . magic mouthwash w/lidocaine SOLN Take 5 mLs by mouth 4 (four) times daily as needed for mouth pain. 240 mL 1  . ondansetron (ZOFRAN) 8 MG tablet Take 8 mg by mouth every 8 (eight) hours as needed for nausea or vomiting.     . predniSONE (DELTASONE) 5 MG  tablet Take 1 tablet (5 mg total) by mouth 2 (two) times daily with a meal. 120 tablet 3  . tamsulosin (FLOMAX) 0.4 MG CAPS capsule Take 0.4 mg by mouth 2 (two) times daily.      No current facility-administered medications for this visit.      Allergies:  Allergies  Allergen Reactions  . Codeine Nausea Only    Past Medical History, Surgical history, Social history, and Family History were reviewed and updated.   Physical Exam: Blood pressure (!) 148/93, pulse 60, temperature 98.5 F (36.9 C), temperature source Oral, resp. rate 18, height 5\' 10"  (1.778 m), weight 259 lb 12.8 oz (117.8 kg), SpO2 98 %. ECOG: 0 General appearance: Well-appearing gentleman without distress. Head: Atraumatic without abnormalities. Eyes: Pupils are equal and round reactive to light. Oropharynx: No oral thrush or ulcers. Lymph nodes: No cervical, supraclavicular, inguinal or axillary lymphadenopathy. Heart: Regular rate without any murmurs or gallops. Lung: Clear that any rhonchi, wheezes or dullness to percussion. Abdomin: Soft, nontender, nondistended with good bowel sounds.  No rebound or guarding. Musculoskeletal: No joint deformity or effusion.  No clubbing or cyanosis. Skin: No skin rashes or lesions. Neurological: No deficits noted motor, sensory exam.   Lab Results: Lab Results  Component Value Date   WBC 14.3 (H) 01/14/2018   HGB 11.5 (L) 01/14/2018   HCT 34.2 (L) 01/14/2018   MCV 95.5 01/14/2018   PLT 317 01/14/2018     Chemistry      Component Value Date/Time   NA 136 12/29/2017 1917   NA 135 (L) 06/13/2014 0944   K 4.0 12/29/2017 1917   K 5.4 (H) 06/13/2014 0944   CL 101 12/29/2017 1917   CO2 22 12/29/2017 1917   CO2 25 06/13/2014 0944   BUN 12 12/29/2017 1917   BUN 18.8 06/13/2014 0944   CREATININE 0.83 12/29/2017 1917   CREATININE 1.00 12/24/2017 0855   CREATININE 1.0 06/13/2014 0944      Component Value Date/Time   CALCIUM 9.1 12/29/2017 1917   CALCIUM 10.0  06/13/2014 0944   ALKPHOS 119 12/29/2017 1917   ALKPHOS 96 06/13/2014 0944   AST 20 12/29/2017 1917   AST 18 12/24/2017 0855   AST 104 (H) 06/13/2014 0944   ALT 26 12/29/2017 1917   ALT 27 12/24/2017 0855   ALT 126 (H) 06/13/2014 0944   BILITOT 0.9 12/29/2017 1917   BILITOT 0.4 12/24/2017 0855   BILITOT 0.63 06/13/2014 0944     Results for ESWIN, WORRELL (MRN [id number]) as of 01/14/2018 09:04  Ref. Range 12/04/2017 08:44 12/24/2017 08:55  Prostate Specific Ag, Serum Latest Ref Range: 0.0 - 4.0 ng/mL 21.0 (H) 20.8 (H)    Impression and Plan:  62 year old man with the:  1.Castration-resistant prostate cancer with documented metastatic disease  to the bone.   He is currently on Taxotere chemotherapy and have tolerated with few complications.  He reports abdominal issues with fatigue and anorexia although his weight remains stable.  His PSA showed excellent decline initially but currently plateau around 20.8.  Risks and benefits of continuing this therapy was reviewed today in detail with the patient.  Complication associated with long-term Taxotere chemotherapy were outlined including worsening neuropathy as well as further complications as mentioned previously.  The option of continuing this therapy at a lower dose was also reviewed.  After discussion today, he is agreeable to proceed with chemotherapy with the same dose and schedule.  We will continue to monitor his PSA and will make decisions in the future whether to continue with chemotherapy beyond cycle 6.  Ideally 10 cycles of chemotherapy would be given but discontinuation after cycle 6 to be a possibility depending on his tolerance and his PSA plateau.  2.  IV access: His peripheral veins continue to be adequate at this time.  He declined Port-A-Cath.  3.  Antiemetics: No issues associated with nausea and vomiting.  4.  Neutropenia prophylaxis: He remains on Udeny[place] after each cycle of chemotherapy.  No issues or  complications related to it.  5.  Androgen depravation: He received Lupron at 30 mg on Dec 04, 2017.  This will be repeated in 4 months.  He has no complications related to this therapy.  6.  Fever: Etiology appears to be not infectious.  Likely related to cytokine release and recovery from his chemotherapy.  We have discussed strategies to mitigate unnecessary trips to the emergency department including early reporting of the symptoms.  7.  Headaches: Manageable at this time with the current medication.  8. Follow-up: We will be in 3 weeks for the next cycle of Taxotere chemotherapy.  25 minutes was spent with the patient face-to-face today.  More than 50% of time was dedicated to patient counseling, education and answering questions regarding diagnosis, prognosis and future plan of care.       Zola Button, MD 6/13/20199:19 AM

## 2018-01-15 ENCOUNTER — Telehealth: Payer: Self-pay

## 2018-01-15 ENCOUNTER — Telehealth: Payer: Self-pay | Admitting: *Deleted

## 2018-01-15 ENCOUNTER — Inpatient Hospital Stay: Payer: 59

## 2018-01-15 VITALS — BP 147/88 | HR 52 | Temp 98.0°F | Resp 18

## 2018-01-15 DIAGNOSIS — C61 Malignant neoplasm of prostate: Secondary | ICD-10-CM | POA: Diagnosis not present

## 2018-01-15 LAB — PROSTATE-SPECIFIC AG, SERUM (LABCORP): Prostate Specific Ag, Serum: 20.8 ng/mL — ABNORMAL HIGH (ref 0.0–4.0)

## 2018-01-15 MED ORDER — PEGFILGRASTIM-CBQV 6 MG/0.6ML ~~LOC~~ SOSY
PREFILLED_SYRINGE | SUBCUTANEOUS | Status: AC
  Filled 2018-01-15: qty 0.6

## 2018-01-15 MED ORDER — PEGFILGRASTIM-CBQV 6 MG/0.6ML ~~LOC~~ SOSY
6.0000 mg | PREFILLED_SYRINGE | Freq: Once | SUBCUTANEOUS | Status: AC
  Administered 2018-01-15: 6 mg via SUBCUTANEOUS

## 2018-01-15 NOTE — Telephone Encounter (Signed)
As noted below by Dr. Shadad, I informed the patient of his PSA level. He verbalized understanding. 

## 2018-01-15 NOTE — Telephone Encounter (Signed)
-----   Message from Wyatt Portela, MD sent at 01/15/2018  8:29 AM EDT ----- Please let him know his PSA

## 2018-01-15 NOTE — Telephone Encounter (Signed)
Per 6/14 no los 

## 2018-01-26 ENCOUNTER — Other Ambulatory Visit: Payer: Self-pay | Admitting: *Deleted

## 2018-01-26 DIAGNOSIS — C61 Malignant neoplasm of prostate: Secondary | ICD-10-CM

## 2018-01-26 DIAGNOSIS — C7951 Secondary malignant neoplasm of bone: Principal | ICD-10-CM

## 2018-01-26 MED ORDER — TAMSULOSIN HCL 0.4 MG PO CAPS: 0.4000 mg | ORAL_CAPSULE | Freq: Two times a day (BID) | ORAL | 3 refills | Status: DC

## 2018-02-03 ENCOUNTER — Other Ambulatory Visit: Payer: Self-pay | Admitting: Nurse Practitioner

## 2018-02-03 ENCOUNTER — Inpatient Hospital Stay (HOSPITAL_BASED_OUTPATIENT_CLINIC_OR_DEPARTMENT_OTHER): Payer: 59 | Admitting: Nurse Practitioner

## 2018-02-03 ENCOUNTER — Encounter: Payer: Self-pay | Admitting: Nurse Practitioner

## 2018-02-03 ENCOUNTER — Inpatient Hospital Stay: Payer: 59

## 2018-02-03 ENCOUNTER — Inpatient Hospital Stay: Payer: 59 | Attending: Oncology

## 2018-02-03 VITALS — BP 146/92 | HR 77 | Temp 98.4°F | Resp 17 | Ht 70.0 in | Wt 265.0 lb

## 2018-02-03 DIAGNOSIS — Z923 Personal history of irradiation: Secondary | ICD-10-CM | POA: Diagnosis not present

## 2018-02-03 DIAGNOSIS — Z192 Hormone resistant malignancy status: Secondary | ICD-10-CM | POA: Diagnosis not present

## 2018-02-03 DIAGNOSIS — R509 Fever, unspecified: Secondary | ICD-10-CM

## 2018-02-03 DIAGNOSIS — Z7689 Persons encountering health services in other specified circumstances: Secondary | ICD-10-CM | POA: Diagnosis not present

## 2018-02-03 DIAGNOSIS — C7951 Secondary malignant neoplasm of bone: Secondary | ICD-10-CM

## 2018-02-03 DIAGNOSIS — R42 Dizziness and giddiness: Secondary | ICD-10-CM | POA: Insufficient documentation

## 2018-02-03 DIAGNOSIS — Z5111 Encounter for antineoplastic chemotherapy: Secondary | ICD-10-CM | POA: Diagnosis not present

## 2018-02-03 DIAGNOSIS — C61 Malignant neoplasm of prostate: Secondary | ICD-10-CM

## 2018-02-03 DIAGNOSIS — Z79899 Other long term (current) drug therapy: Secondary | ICD-10-CM | POA: Diagnosis not present

## 2018-02-03 DIAGNOSIS — R5383 Other fatigue: Secondary | ICD-10-CM | POA: Diagnosis not present

## 2018-02-03 LAB — CBC WITH DIFFERENTIAL (CANCER CENTER ONLY)
Basophils Absolute: 0 10*3/uL (ref 0.0–0.1)
Basophils Relative: 0 %
Eosinophils Absolute: 0 10*3/uL (ref 0.0–0.5)
Eosinophils Relative: 0 %
HCT: 34.5 % — ABNORMAL LOW (ref 38.4–49.9)
Hemoglobin: 11.5 g/dL — ABNORMAL LOW (ref 13.0–17.1)
Lymphocytes Relative: 5 %
Lymphs Abs: 0.6 10*3/uL — ABNORMAL LOW (ref 0.9–3.3)
MCH: 32.6 pg (ref 27.2–33.4)
MCHC: 33.3 g/dL (ref 32.0–36.0)
MCV: 97.7 fL (ref 79.3–98.0)
Monocytes Absolute: 0.6 10*3/uL (ref 0.1–0.9)
Monocytes Relative: 5 %
Neutro Abs: 11.8 10*3/uL — ABNORMAL HIGH (ref 1.5–6.5)
Neutrophils Relative %: 90 %
Platelet Count: 283 10*3/uL (ref 140–400)
RBC: 3.53 MIL/uL — ABNORMAL LOW (ref 4.20–5.82)
RDW: 16.2 % — ABNORMAL HIGH (ref 11.0–14.6)
WBC Count: 13 10*3/uL — ABNORMAL HIGH (ref 4.0–10.3)

## 2018-02-03 LAB — CMP (CANCER CENTER ONLY)
ALT: 32 U/L (ref 0–44)
AST: 21 U/L (ref 15–41)
Albumin: 4.1 g/dL (ref 3.5–5.0)
Alkaline Phosphatase: 115 U/L (ref 38–126)
Anion gap: 11 (ref 5–15)
BUN: 27 mg/dL — ABNORMAL HIGH (ref 6–20)
CO2: 23 mmol/L (ref 22–32)
Calcium: 9.8 mg/dL (ref 8.9–10.3)
Chloride: 100 mmol/L (ref 98–111)
Creatinine: 1.43 mg/dL — ABNORMAL HIGH (ref 0.61–1.24)
GFR, Est AFR Am: >60 mL/min (ref >60)
GFR, Estimated: 52 mL/min — ABNORMAL LOW (ref >60)
Glucose, Bld: 153 mg/dL — ABNORMAL HIGH (ref 70–99)
Potassium: 4.4 mmol/L (ref 3.5–5.1)
Sodium: 134 mmol/L — ABNORMAL LOW (ref 135–145)
Total Bilirubin: 0.3 mg/dL (ref 0.3–1.2)
Total Protein: 6.9 g/dL (ref 6.5–8.1)

## 2018-02-03 MED ORDER — SODIUM CHLORIDE 0.9 % IV SOLN
Freq: Once | INTRAVENOUS | Status: AC
  Administered 2018-02-03: 14:00:00 via INTRAVENOUS

## 2018-02-03 MED ORDER — SODIUM CHLORIDE 0.9 % IV SOLN
75.0000 mg/m2 | Freq: Once | INTRAVENOUS | Status: AC
  Administered 2018-02-03: 170 mg via INTRAVENOUS
  Filled 2018-02-03: qty 17

## 2018-02-03 MED ORDER — DEXAMETHASONE SODIUM PHOSPHATE 10 MG/ML IJ SOLN
INTRAMUSCULAR | Status: AC
  Filled 2018-02-03: qty 1

## 2018-02-03 MED ORDER — DEXAMETHASONE SODIUM PHOSPHATE 10 MG/ML IJ SOLN
10.0000 mg | Freq: Once | INTRAMUSCULAR | Status: AC
  Administered 2018-02-03: 10 mg via INTRAVENOUS

## 2018-02-03 NOTE — Patient Instructions (Signed)
[facility] Discharge Instructions for Patients Receiving Chemotherapy  Today you received the following chemotherapy agents Taxotere To help prevent nausea and vomiting after your treatment, we encourage you to take your nausea medication as prescribed.   If you develop nausea and vomiting that is not controlled by your nausea medication, call the clinic.   BELOW ARE SYMPTOMS THAT SHOULD BE REPORTED IMMEDIATELY:  *FEVER GREATER THAN 100.5 F  *CHILLS WITH OR WITHOUT FEVER  NAUSEA AND VOMITING THAT IS NOT CONTROLLED WITH YOUR NAUSEA MEDICATION  *UNUSUAL SHORTNESS OF BREATH  *UNUSUAL BRUISING OR BLEEDING  TENDERNESS IN MOUTH AND THROAT WITH OR WITHOUT PRESENCE OF ULCERS  *URINARY PROBLEMS  *BOWEL PROBLEMS  UNUSUAL RASH Items with * indicate a potential emergency and should be followed up as soon as possible.  Feel free to call the clinic should you have any questions or concerns. The clinic phone number is [phone].  Please show the CHEMO ALERT CARD at check-in to the Emergency Department and triage nurse.   

## 2018-02-03 NOTE — Progress Notes (Signed)
Pt's serum creatinine elevated above baseline today. Per NP, pt to push fluids and will follow up repeat CMET when he returns for Udenyca injection 7/5.  Demetrius Charity, PharmD Clinical Pharmacist Pharmacy Phone: [phone] 02/03/2018

## 2018-02-03 NOTE — Progress Notes (Signed)
  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  He was diagnosed in 2015 after presenting with PSA of 6.4 and a Gleason score 4+4 equals 8.    Prior Therapy:   Status post biopsy done on July of 2015.  He is status post lymph node dissection completed in 2015 at [facility].  He was treated there with definitive radiation therapy utilizing seed implant as well as external beam radiation completed in 2016.  He developed advanced disease with bone involvement.  He received radiation therapy to the pelvis in 2017.  Xtandi 160 mg daily that was poorly tolerated and was discontinued.  He was switched to [name] which he took until 2019 and developed progression of disease with bony metastasis as well as PSA of 80.   Current therapy: Taxotere chemotherapy cycle 1 given in March 2019.  Cycle 5 given 01/14/2018.  INTERVAL HISTORY:   Alejandro [name] returns as scheduled.  He completed cycle 5 Taxotere 01/14/2018.  Stomach intermittently feels "unsettled".  No vomiting.  He had a few small mouth ulcers.  Roof of mouth felt "burnt" for a few days.  He was able to eat and drink without difficulty.  No constipation or diarrhea.  He continues to note an alteration in taste.  He has numbness in his feet which predated the start of chemotherapy.  He thinks this is stable to mildly increased.  Objective:  Vital signs in last 24 hours:  Blood pressure (!) 146/92, pulse 77, temperature 98.4 F (36.9 C), temperature source Oral, resp. rate 17, height 5\' 10"  (1.778 m), weight 265 lb (120.2 kg), SpO2 98 %.    HEENT: No thrush or ulcers. Resp: Lungs clear bilaterally. Cardio: Regular rate and rhythm. GI: Abdomen soft and nontender.  No hepatomegaly. Vascular: No leg edema. Neuro: Vibratory sense intact over the fingertips per tuning fork exam. Skin: Few ecchymoses scattered over the  bilateral forearms.   Lab Results:  Lab Results  Component Value Date   WBC 13.0 (H) 02/03/2018   HGB 11.5 (L) 02/03/2018   HCT 34.5 (L) 02/03/2018   MCV 97.7 02/03/2018   PLT 283 02/03/2018   NEUTROABS 11.8 (H) 02/03/2018    Imaging:  No results found.  Medications: I have reviewed the patient's current medications.  Assessment/Plan: 1. Castration resistant prostate cancer with documented metastatic disease to bone currently on active treatment with Taxotere, 5 cycles completed to date. 2. IV access.  He is treated peripherally. 3. Neutropenia prophylaxis.  He receives [place]. 4. Androgen deprivation.  He last received Lupron 30 mg 12/04/2017.  This will be continued every 4 months. 5. Fever.  Not felt to be infectious but likely related to cytokine release and recovery from chemotherapy.  Disposition: Alejandro [name] appears stable.  He has completed 5 cycles of Taxotere.  Plan to proceed with cycle 6 today as scheduled.  He will return for [facility] 02/05/2018.  He will return for lab and follow-up in 3 weeks.  He will contact the office in the interim with any problems.    Ned Card ANP/GNP-BC   02/03/2018  2:03 PM

## 2018-02-04 LAB — PROSTATE-SPECIFIC AG, SERUM (LABCORP): Prostate Specific Ag, Serum: 24.5 ng/mL — ABNORMAL HIGH (ref 0.0–4.0)

## 2018-02-05 ENCOUNTER — Other Ambulatory Visit: Payer: Self-pay

## 2018-02-05 ENCOUNTER — Inpatient Hospital Stay: Payer: 59

## 2018-02-05 VITALS — BP 138/90 | HR 58 | Temp 98.7°F | Resp 18

## 2018-02-05 DIAGNOSIS — C61 Malignant neoplasm of prostate: Secondary | ICD-10-CM

## 2018-02-05 LAB — COMPREHENSIVE METABOLIC PANEL
ALT: 33 U/L (ref 0–44)
AST: 24 U/L (ref 15–41)
Albumin: 3.9 g/dL (ref 3.5–5.0)
Alkaline Phosphatase: 94 U/L (ref 38–126)
Anion gap: 10 (ref 5–15)
BUN: 20 mg/dL (ref 6–20)
CO2: 27 mmol/L (ref 22–32)
Calcium: 9.2 mg/dL (ref 8.9–10.3)
Chloride: 101 mmol/L (ref 98–111)
Creatinine, Ser: 1.13 mg/dL (ref 0.61–1.24)
GFR calc Af Amer: >60 mL/min (ref >60)
GFR calc non Af Amer: >60 mL/min (ref >60)
Glucose, Bld: 106 mg/dL — ABNORMAL HIGH (ref 70–99)
Potassium: 3.9 mmol/L (ref 3.5–5.1)
Sodium: 138 mmol/L (ref 135–145)
Total Bilirubin: 0.6 mg/dL (ref 0.3–1.2)
Total Protein: 6.4 g/dL — ABNORMAL LOW (ref 6.5–8.1)

## 2018-02-05 LAB — CBC WITH DIFFERENTIAL (CANCER CENTER ONLY)
Basophils Absolute: 0 10*3/uL (ref 0.0–0.1)
Basophils Relative: 1 %
Eosinophils Absolute: 0 10*3/uL (ref 0.0–0.5)
Eosinophils Relative: 0 %
HCT: 35.2 % — ABNORMAL LOW (ref 38.4–49.9)
Hemoglobin: 11.9 g/dL — ABNORMAL LOW (ref 13.0–17.1)
Lymphocytes Relative: 13 %
Lymphs Abs: 0.7 10*3/uL — ABNORMAL LOW (ref 0.9–3.3)
MCH: 32.9 pg (ref 27.2–33.4)
MCHC: 33.9 g/dL (ref 32.0–36.0)
MCV: 97.1 fL (ref 79.3–98.0)
Monocytes Absolute: 0.4 10*3/uL (ref 0.1–0.9)
Monocytes Relative: 7 %
Neutro Abs: 4.2 10*3/uL (ref 1.5–6.5)
Neutrophils Relative %: 79 %
Platelet Count: 255 10*3/uL (ref 140–400)
RBC: 3.62 MIL/uL — ABNORMAL LOW (ref 4.20–5.82)
RDW: 16.8 % — ABNORMAL HIGH (ref 11.0–14.6)
WBC Count: 5.3 10*3/uL (ref 4.0–10.3)

## 2018-02-05 MED ORDER — PEGFILGRASTIM-CBQV 6 MG/0.6ML ~~LOC~~ SOSY
6.0000 mg | PREFILLED_SYRINGE | Freq: Once | SUBCUTANEOUS | Status: AC
  Administered 2018-02-05: 6 mg via SUBCUTANEOUS

## 2018-02-05 NOTE — Patient Instructions (Signed)
Pegfilgrastim injection What is this medicine? PEGFILGRASTIM (PEG fil gra stim) is a long-acting granulocyte colony-stimulating factor that stimulates the growth of neutrophils, a type of white blood cell important in the body's fight against infection. It is used to reduce the incidence of fever and infection in patients with certain types of cancer who are receiving chemotherapy that affects the bone marrow, and to increase survival after being exposed to high doses of radiation. This medicine may be used for other purposes; ask your health care provider or pharmacist if you have questions. COMMON BRAND NAME(S): Neulasta What should I tell my health care provider before I take this medicine? They need to know if you have any of these conditions: -kidney disease -latex allergy -ongoing radiation therapy -sickle cell disease -skin reactions to acrylic adhesives (On-Body Injector only) -an unusual or allergic reaction to pegfilgrastim, filgrastim, other medicines, foods, dyes, or preservatives -pregnant or trying to get pregnant -breast-feeding How should I use this medicine? This medicine is for injection under the skin. If you get this medicine at home, you will be taught how to prepare and give the pre-filled syringe or how to use the On-body Injector. Refer to the patient Instructions for Use for detailed instructions. Use exactly as directed. Tell your healthcare provider immediately if you suspect that the On-body Injector may not have performed as intended or if you suspect the use of the On-body Injector resulted in a missed or partial dose. It is important that you put your used needles and syringes in a special sharps container. Do not put them in a trash can. If you do not have a sharps container, call your pharmacist or healthcare provider to get one. Talk to your pediatrician regarding the use of this medicine in children. While this drug may be prescribed for selected conditions,  precautions do apply. Overdosage: If you think you have taken too much of this medicine contact a poison control center or emergency room at once. NOTE: This medicine is only for you. Do not share this medicine with others. What if I miss a dose? It is important not to miss your dose. Call your doctor or health care professional if you miss your dose. If you miss a dose due to an On-body Injector failure or leakage, a new dose should be administered as soon as possible using a single prefilled syringe for manual use. What may interact with this medicine? Interactions have not been studied. Give your health care provider a list of all the medicines, herbs, non-prescription drugs, or dietary supplements you use. Also tell them if you smoke, drink alcohol, or use illegal drugs. Some items may interact with your medicine. This list may not describe all possible interactions. Give your health care provider a list of all the medicines, herbs, non-prescription drugs, or dietary supplements you use. Also tell them if you smoke, drink alcohol, or use illegal drugs. Some items may interact with your medicine. What should I watch for while using this medicine? You may need blood work done while you are taking this medicine. If you are going to need a MRI, CT scan, or other procedure, tell your doctor that you are using this medicine (On-Body Injector only). What side effects may I notice from receiving this medicine? Side effects that you should report to your doctor or health care professional as soon as possible: -allergic reactions like skin rash, itching or hives, swelling of the face, lips, or tongue -dizziness -fever -pain, redness, or irritation at site   where injected -pinpoint red spots on the skin -red or dark-brown urine -shortness of breath or breathing problems -stomach or side pain, or pain at the shoulder -swelling -tiredness -trouble passing urine or change in the amount of urine Side  effects that usually do not require medical attention (report to your doctor or health care professional if they continue or are bothersome): -bone pain -muscle pain This list may not describe all possible side effects. Call your doctor for medical advice about side effects. You may report side effects to FDA at 1-800-FDA-1088. Where should I keep my medicine? Keep out of the reach of children. Store pre-filled syringes in a refrigerator between 2 and 8 degrees C (36 and 46 degrees F). Do not freeze. Keep in carton to protect from light. Throw away this medicine if it is left out of the refrigerator for more than 48 hours. Throw away any unused medicine after the expiration date. NOTE: This sheet is a summary. It may not cover all possible information. If you have questions about this medicine, talk to your doctor, pharmacist, or health care provider.  2018 Elsevier/Gold Standard (2016-07-17 12:58:03)  

## 2018-02-09 ENCOUNTER — Telehealth: Payer: Self-pay | Admitting: Oncology

## 2018-02-09 NOTE — Telephone Encounter (Signed)
Faxed medical records to [facility] on 02/09/18, Release ID: [id number]

## 2018-02-09 NOTE — Telephone Encounter (Signed)
Pt called in and cancelled all of his appointments. Did not give a reason why, nor did he want to reschedule them.

## 2018-02-10 ENCOUNTER — Encounter: Payer: Self-pay | Admitting: Oncology

## 2018-02-12 ENCOUNTER — Telehealth: Payer: Self-pay

## 2018-02-12 NOTE — Telephone Encounter (Signed)
Pt requesting f/u with Dr. Alen Blew and that it was not his intent to transfer care yet. He wants to discuss options with Dr. Alen Blew, but cancelled all appts because chemotherapy was too difficult for him. Left VM on pt phone notifying him to expect a call from [place] to set up appt to see Dr. Alen Blew. Scheduling message sent by MD. MyChart message sent in addition to the pt.  Telephone message from 02/10/18. Documented in wrong patient chart:  Received MyChart message from pt requesting disability paperwork to be completed. Pt has transferred care to [facility]. Per MD, paperwork faxed to new facility [phone]. Message sent back to pt notifying of fax. Confirmed fax receipt 02/10/18 at 1242.

## 2018-02-16 ENCOUNTER — Telehealth: Payer: Self-pay | Admitting: Oncology

## 2018-02-16 NOTE — Telephone Encounter (Signed)
Called pt re 7/12 sch msg - left vm for pt re appts that were added.

## 2018-02-23 ENCOUNTER — Inpatient Hospital Stay (HOSPITAL_BASED_OUTPATIENT_CLINIC_OR_DEPARTMENT_OTHER): Payer: 59 | Admitting: Oncology

## 2018-02-23 VITALS — BP 121/66 | HR 65 | Temp 98.5°F | Resp 17 | Ht 70.0 in | Wt 261.7 lb

## 2018-02-23 DIAGNOSIS — Z192 Hormone resistant malignancy status: Secondary | ICD-10-CM

## 2018-02-23 DIAGNOSIS — R5383 Other fatigue: Secondary | ICD-10-CM

## 2018-02-23 DIAGNOSIS — Z5111 Encounter for antineoplastic chemotherapy: Secondary | ICD-10-CM | POA: Diagnosis not present

## 2018-02-23 DIAGNOSIS — C7951 Secondary malignant neoplasm of bone: Secondary | ICD-10-CM | POA: Diagnosis not present

## 2018-02-23 DIAGNOSIS — R42 Dizziness and giddiness: Secondary | ICD-10-CM

## 2018-02-23 DIAGNOSIS — Z7689 Persons encountering health services in other specified circumstances: Secondary | ICD-10-CM

## 2018-02-23 DIAGNOSIS — R509 Fever, unspecified: Secondary | ICD-10-CM

## 2018-02-23 DIAGNOSIS — C61 Malignant neoplasm of prostate: Secondary | ICD-10-CM | POA: Diagnosis not present

## 2018-02-23 DIAGNOSIS — Z923 Personal history of irradiation: Secondary | ICD-10-CM

## 2018-02-23 DIAGNOSIS — Z79899 Other long term (current) drug therapy: Secondary | ICD-10-CM

## 2018-02-23 NOTE — Progress Notes (Signed)
Hematology and Oncology Follow Up Visit  Alejandro [name] [id number] [birth year] 62 y.o. 02/23/2018 4:13 PM [name], MDRusso, [name], MD   Principle Diagnosis: 62 year old man with castration-resistant prostate cancer documented in 2017 after initial diagnosis in 2015 with PSA of 6.4 and a Gleason score 4+4 equals 8.    Prior Therapy:   Status post biopsy done on July of 2015 followed by lymph node dissection completed in 2015 at [facility].  He was treated there with definitive radiation therapy utilizing seed implant as well as external beam radiation completed in 2016.  He developed advanced disease with bone involvement.  He received radiation therapy to the pelvis in 2017.  Xtandi 160 mg daily that was poorly tolerated and was discontinued.  He was switched to [name] which she took until 2019 and developed progression of disease with bony metastasis as well as PSA of 80.   Current therapy: Taxotere chemotherapy at 75 mg/m cycle 1 given in March 2019.  He is status post 6 cycles of chemotherapy last cycle given on February 03, 2018.  Interim History:  Alejandro [name] presents today for a follow-up visit.  Since the last visit, he received the last cycle of chemotherapy with increased complications.  He continues to have issues with fatigue, dizziness, lacrimal duct dysfunction in addition to weight gain.  He denies any nausea, vomiting or worsening neuropathy.  He is performance status has been limited because of the toxicity associated with Taxotere.  He has reported a lot of drainage in his eyes as well as nasal discharge.    He denies any headaches, blurry vision, syncope or seizures.  He denies any alteration in mental status but does report periodic confusion. He does not report any chills or sweats.  He denies any chest pain, palpitation, orthopnea or leg edema.  He does not report any cough, wheezing or hemoptysis.  Does not report any nausea vomiting or  abdominal pain. Does not report any hematochezia or melena. He did not report any bone pain or pathological fractures.  He does not report any lymphadenopathy or petechiae. Does not report any skin rashes or lesions.  Denies any anxiety or depression.  He denies any bleeding or clotting tendencies.  Remaining review of system is negative.    Medications: I have reviewed the patient's current medications.  Current Outpatient Medications  Medication Sig Dispense Refill  . Alirocumab (PRALUENT [place]) Inject 75 mg into the skin every 14 (fourteen) days.     . ALPRAZolam (XANAX) 0.5 MG tablet Take 0.5 mg by mouth at bedtime.     Marland Kitchen amLODipine (NORVASC) 5 MG tablet Take 5 mg by mouth daily.     Marland Kitchen aspirin 325 MG EC tablet Take 325 mg by mouth daily.    . Butalbital-APAP-Caffeine (FIORICET) 50-300-40 MG CAPS Take one or two tablets every 4 hours as needed. (Patient taking differently: Take 1 tablet by mouth every 4 (four) hours as needed (headache). Take one or two tablets every 4 hours as needed.) 84 capsule 1  . BYSTOLIC 10 MG tablet Take 10 mg by mouth every evening.   4  . dexamethasone (DECADRON) 4 MG tablet Take 2 tablets (8 mg total) by mouth 2 (two) times daily. Take 2 tablets BID for 3 Days before chemo, the day of chemo and the day after chemo 36 tablet 1  . ezetimibe (ZETIA) 10 MG tablet Take 10 mg by mouth daily.    Marland Kitchen loratadine (CLARITIN) 10 MG tablet Take 10  mg by mouth daily.    Marland Kitchen losartan (COZAAR) 50 MG tablet Take 50 mg by mouth daily.     . magic mouthwash w/lidocaine SOLN Take 5 mLs by mouth 4 (four) times daily as needed for mouth pain. 240 mL 2  . ondansetron (ZOFRAN) 8 MG tablet Take 8 mg by mouth every 8 (eight) hours as needed for nausea or vomiting.     . predniSONE (DELTASONE) 5 MG tablet Take 1 tablet (5 mg total) by mouth 2 (two) times daily with a meal. 120 tablet 3  . tamsulosin (FLOMAX) 0.4 MG CAPS capsule Take 1 capsule (0.4 mg total) by mouth 2 (two) times daily. 60 capsule 3    No current facility-administered medications for this visit.      Allergies:  Allergies  Allergen Reactions  . Codeine Nausea Only    Past Medical History, Surgical history, Social history, and Family History were reviewed and updated.   Physical Exam: Blood pressure 121/66, pulse 65, temperature 98.5 F (36.9 C), temperature source Oral, resp. rate 17, height 5\' 10"  (1.778 m), weight 261 lb 11.2 oz (118.7 kg), SpO2 97 %. ECOG: 0 General appearance: Alert, awake gentleman without distress. Head: Normocephalic without abnormalities. Eyes: Sclera anicteric. Oropharynx: No oral thrush or ulcers. Lymph nodes: No lymphadenopathy noted in the cervical, supraclavicular, inguinal or axillary lymph node areas. Heart: Regular rate without any murmurs or gallops. Lung: Clear in all fields without any rhonchi, wheezes or dullness to percussion. Abdomin: Soft, nontender, good bowel sounds.  No rebound or guarding.  No shifting dullness or ascites. Musculoskeletal: No joint deformity or effusion. Skin: No ecchymosis or petechiae. Neurological: No motor or sensory deficits.   Lab Results: Lab Results  Component Value Date   WBC 5.3 02/05/2018   HGB 11.9 (L) 02/05/2018   HCT 35.2 (L) 02/05/2018   MCV 97.1 02/05/2018   PLT 255 02/05/2018     Chemistry      Component Value Date/Time   NA 138 02/05/2018 1249   NA 135 (L) 06/13/2014 0944   K 3.9 02/05/2018 1249   K 5.4 (H) 06/13/2014 0944   CL 101 02/05/2018 1249   CO2 27 02/05/2018 1249   CO2 25 06/13/2014 0944   BUN 20 02/05/2018 1249   BUN 18.8 06/13/2014 0944   CREATININE 1.13 02/05/2018 1249   CREATININE 1.43 (H) 02/03/2018 1318   CREATININE 1.0 06/13/2014 0944      Component Value Date/Time   CALCIUM 9.2 02/05/2018 1249   CALCIUM 10.0 06/13/2014 0944   ALKPHOS 94 02/05/2018 1249   ALKPHOS 96 06/13/2014 0944   AST 24 02/05/2018 1249   AST 21 02/03/2018 1318   AST 104 (H) 06/13/2014 0944   ALT 33 02/05/2018 1249    ALT 32 02/03/2018 1318   ALT 126 (H) 06/13/2014 0944   BILITOT 0.6 02/05/2018 1249   BILITOT 0.3 02/03/2018 1318   BILITOT 0.63 06/13/2014 0944     Results for GURNIE, DURIS (MRN [id number]) as of 02/23/2018 16:23  Ref. Range 01/14/2018 08:45 02/03/2018 13:18  Prostate Specific Ag, Serum Latest Ref Range: 0.0 - 4.0 ng/mL 20.8 (H) 24.5 (H)     Impression and Plan:  62 year old man with the:  1.Castration-resistant prostate cancer with with disease to the bone and limited lymph node involvement.  He is status post multiple therapies outlined above and currently receiving Taxotere chemotherapy.  He completed 6 cycles of therapy with multiple complications that affected his quality of life.  He has  reported dizziness, unsteadiness and increased fatigue.  He reports the complication associated with chemotherapy is worse than the disease itself.  The natural course of this disease was reviewed today in detail in addition to alternative treatment options.  His PSA has plateaued on chemotherapy with a slow rise noted since the last visit.  Continuing Taxotere at a lower dose was offered to the patient to complete 10 cycles of therapy but he declined.  Using different salvage therapy which include Jevtana chemotherapy, Xofigo or obtaining tissue biopsy for next generation sequencing and potentially off label use of additional event was discussed.  Risks and benefits and rationale for all these options were discussed today in detail.  After discussion today, he is traveling to [facility] for restaging work-up and evaluation.  Upon his return, he will communicate to me how he would like to proceed.  If his staging work-up did not show any visceral metastasis, I have recommended to proceeding with radium treatment to preserve his quality of life.  I would defer using Jevtana chemotherapy to a later date.  If an area that could be biopsied is identified, tissue biopsy and molecular  testing would be reasonable as well.  2.  IV access: No Port-A-Cath inserted at this time.  Peripheral veins have been adequate.  3.  Antiemetics: No nausea or vomiting reported at this time.  4.  Neutropenia prophylaxis: He received Udenyca after each cycle of chemotherapy.  No neutropenic fever noted.  5.  Androgen depravation: He received Lupron at 30 mg on Dec 04, 2017.  His will be repeated in 4 months.  6.  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  7. Follow-up: We will be determined upon his return from [place] after completing staging work-up and his decision on different treatment option.   25 minutes was spent with the patient face-to-face today.  More than 50% of time was dedicated to patient counseling, education and discussing the natural course of this disease as well as alternative treatment options as well as his preference.       Zola Button, MD 7/23/20194:13 PM

## 2018-02-24 ENCOUNTER — Telehealth: Payer: Self-pay | Admitting: Oncology

## 2018-02-24 NOTE — Telephone Encounter (Signed)
Per 7/23 no los

## 2018-02-25 ENCOUNTER — Ambulatory Visit: Payer: Self-pay | Admitting: Oncology

## 2018-02-25 ENCOUNTER — Ambulatory Visit: Payer: Self-pay

## 2018-02-25 ENCOUNTER — Other Ambulatory Visit: Payer: Self-pay

## 2018-02-26 ENCOUNTER — Ambulatory Visit: Payer: Self-pay

## 2018-03-01 ENCOUNTER — Telehealth: Payer: Self-pay | Admitting: *Deleted

## 2018-03-01 NOTE — Telephone Encounter (Signed)
Patient calling to say he has returned from [place]. He will have nurse there to fax O.V notes, labs, CT and bone scan results to dr shadad's nurse, for dr shadad's review.

## 2018-03-02 ENCOUNTER — Other Ambulatory Visit: Payer: Self-pay | Admitting: *Deleted

## 2018-03-02 DIAGNOSIS — C61 Malignant neoplasm of prostate: Secondary | ICD-10-CM

## 2018-03-02 DIAGNOSIS — C7951 Secondary malignant neoplasm of bone: Principal | ICD-10-CM

## 2018-03-02 MED ORDER — TAMSULOSIN HCL 0.4 MG PO CAPS: 0.4000 mg | ORAL_CAPSULE | Freq: Two times a day (BID) | ORAL | 3 refills | Status: DC

## 2018-03-15 ENCOUNTER — Telehealth: Payer: Self-pay | Admitting: *Deleted

## 2018-03-15 NOTE — Telephone Encounter (Signed)
Received voice mail message from patient stating,"I have a release form here from my dentist that I'm going to fax to [facility] tomorrow. My dentist wanted to know if I've been getting any kind of injection to strengthen my bones?

## 2018-03-16 ENCOUNTER — Telehealth: Payer: Self-pay | Admitting: *Deleted

## 2018-03-16 ENCOUNTER — Other Ambulatory Visit: Payer: Self-pay | Admitting: Oncology

## 2018-03-16 DIAGNOSIS — C61 Malignant neoplasm of prostate: Secondary | ICD-10-CM

## 2018-03-16 DIAGNOSIS — C7951 Secondary malignant neoplasm of bone: Principal | ICD-10-CM

## 2018-03-16 MED ORDER — CALCIUM CARBONATE-VITAMIN D 500-200 MG-UNIT PO TABS: 1.0000 | ORAL_TABLET | Freq: Two times a day (BID) | ORAL | 3 refills | Status: DC

## 2018-03-16 NOTE — Telephone Encounter (Signed)
Returned patient's call. Left message to call me.

## 2018-03-16 NOTE — Telephone Encounter (Signed)
Please let him know to start calcium supplement (it will be called to his pharmacy) first. He will need to see his dentist before starting this medication.  Will arrange to start this injection in the next few weeks.

## 2018-03-16 NOTE — Telephone Encounter (Signed)
He has not been on Xgeva. He can have any dental work needed.

## 2018-03-16 NOTE — Telephone Encounter (Signed)
Patient calling to say he was told by physicians @ [facility] that he should be on xgeva. He wants to know if dr Alen Blew will offer this medication?

## 2018-03-17 ENCOUNTER — Telehealth: Payer: Self-pay | Admitting: *Deleted

## 2018-03-17 NOTE — Telephone Encounter (Signed)
Spoke with patient, let him know calcium supplement was called to his pharmacy and he is to start today. Will start xgeva injections in a couple of weeks.

## 2018-03-18 ENCOUNTER — Telehealth: Payer: Self-pay | Admitting: Oncology

## 2018-03-18 NOTE — Telephone Encounter (Signed)
Tried to call regarding 8/26 the gap between appointment

## 2018-03-19 ENCOUNTER — Telehealth: Payer: Self-pay | Admitting: *Deleted

## 2018-03-19 NOTE — Telephone Encounter (Signed)
rc'd disability papers via fax at 10:38 today, documented on clip board and put in box for p/u.

## 2018-03-25 NOTE — Progress Notes (Signed)
FMLA successfully faxed to [facility] attn: Freddy Jaksch at [phone]. Mailed copy to patient address on file.

## 2018-03-26 ENCOUNTER — Other Ambulatory Visit: Payer: Self-pay | Admitting: Oncology

## 2018-03-26 ENCOUNTER — Telehealth: Payer: Self-pay | Admitting: *Deleted

## 2018-03-26 NOTE — Telephone Encounter (Signed)
8:11 am  TC from patient. He states he has been feeling dizzy lately and his blood pressure has been lower than usual.   His last chemo was 02/03/18. He is drinking good amount of fluids. No nausea, vomiting or diarrhea. He is still taking his BP meds.. BP has been 90/57 and 94/60.   Advised pt that he should contact the MD who is managing his BP for this issue.  Advised pt that it is not likely to be related to his chemo from early July. Pt voiced understanding and is comfortable with this plan. No other questions or concerns.

## 2018-03-29 ENCOUNTER — Telehealth: Payer: Self-pay | Admitting: Oncology

## 2018-03-29 ENCOUNTER — Inpatient Hospital Stay: Payer: 59 | Attending: Oncology

## 2018-03-29 ENCOUNTER — Inpatient Hospital Stay (HOSPITAL_BASED_OUTPATIENT_CLINIC_OR_DEPARTMENT_OTHER): Payer: 59 | Admitting: Oncology

## 2018-03-29 ENCOUNTER — Inpatient Hospital Stay: Payer: 59

## 2018-03-29 VITALS — BP 116/68 | HR 52 | Temp 98.1°F | Resp 16 | Ht 70.0 in | Wt 250.8 lb

## 2018-03-29 DIAGNOSIS — Z7982 Long term (current) use of aspirin: Secondary | ICD-10-CM | POA: Diagnosis not present

## 2018-03-29 DIAGNOSIS — Z79899 Other long term (current) drug therapy: Secondary | ICD-10-CM | POA: Diagnosis not present

## 2018-03-29 DIAGNOSIS — Z192 Hormone resistant malignancy status: Secondary | ICD-10-CM | POA: Diagnosis not present

## 2018-03-29 DIAGNOSIS — Z9221 Personal history of antineoplastic chemotherapy: Secondary | ICD-10-CM | POA: Diagnosis not present

## 2018-03-29 DIAGNOSIS — Z79818 Long term (current) use of other agents affecting estrogen receptors and estrogen levels: Secondary | ICD-10-CM | POA: Insufficient documentation

## 2018-03-29 DIAGNOSIS — C61 Malignant neoplasm of prostate: Secondary | ICD-10-CM

## 2018-03-29 DIAGNOSIS — C7951 Secondary malignant neoplasm of bone: Secondary | ICD-10-CM

## 2018-03-29 DIAGNOSIS — Z923 Personal history of irradiation: Secondary | ICD-10-CM | POA: Diagnosis not present

## 2018-03-29 LAB — CBC WITH DIFFERENTIAL (CANCER CENTER ONLY)
Basophils Absolute: 0 10*3/uL (ref 0.0–0.1)
Basophils Relative: 1 %
Eosinophils Absolute: 0.1 10*3/uL (ref 0.0–0.5)
Eosinophils Relative: 1 %
HCT: 40.4 % (ref 38.4–49.9)
Hemoglobin: 13.3 g/dL (ref 13.0–17.1)
Lymphocytes Relative: 14 %
Lymphs Abs: 1 10*3/uL (ref 0.9–3.3)
MCH: 30.9 pg (ref 27.2–33.4)
MCHC: 32.9 g/dL (ref 32.0–36.0)
MCV: 94 fL (ref 79.3–98.0)
Monocytes Absolute: 1 10*3/uL — ABNORMAL HIGH (ref 0.1–0.9)
Monocytes Relative: 13 %
Neutro Abs: 5.4 10*3/uL (ref 1.5–6.5)
Neutrophils Relative %: 71 %
Platelet Count: 267 10*3/uL (ref 140–400)
RBC: 4.3 MIL/uL (ref 4.20–5.82)
RDW: 12.9 % (ref 11.0–14.6)
WBC Count: 7.5 10*3/uL (ref 4.0–10.3)

## 2018-03-29 LAB — CMP (CANCER CENTER ONLY)
ALT: 24 U/L (ref 0–44)
AST: 18 U/L (ref 15–41)
Albumin: 4.1 g/dL (ref 3.5–5.0)
Alkaline Phosphatase: 110 U/L (ref 38–126)
Anion gap: 12 (ref 5–15)
BUN: 8 mg/dL (ref 8–23)
CO2: 24 mmol/L (ref 22–32)
Calcium: 9.9 mg/dL (ref 8.9–10.3)
Chloride: 101 mmol/L (ref 98–111)
Creatinine: 1.1 mg/dL (ref 0.61–1.24)
GFR, Est AFR Am: >60 mL/min (ref >60)
GFR, Estimated: >60 mL/min (ref >60)
Glucose, Bld: 156 mg/dL — ABNORMAL HIGH (ref 70–99)
Potassium: 4.1 mmol/L (ref 3.5–5.1)
Sodium: 137 mmol/L (ref 135–145)
Total Bilirubin: 0.5 mg/dL (ref 0.3–1.2)
Total Protein: 7.5 g/dL (ref 6.5–8.1)

## 2018-03-29 MED ORDER — LEUPROLIDE ACETATE (4 MONTH) 30 MG IM KIT
30.0000 mg | PACK | Freq: Once | INTRAMUSCULAR | Status: AC
  Administered 2018-03-29: 30 mg via INTRAMUSCULAR
  Filled 2018-03-29: qty 30

## 2018-03-29 MED ORDER — DENOSUMAB 120 MG/1.7ML ~~LOC~~ SOLN
120.0000 mg | Freq: Once | SUBCUTANEOUS | Status: AC
  Administered 2018-03-29: 120 mg via SUBCUTANEOUS

## 2018-03-29 MED ORDER — DENOSUMAB 120 MG/1.7ML ~~LOC~~ SOLN
SUBCUTANEOUS | Status: AC
  Filled 2018-03-29: qty 1.7

## 2018-03-29 NOTE — Patient Instructions (Signed)
Leuprolide injection What is this medicine? LEUPROLIDE (loo PROE lide) is a man-made hormone. It is used to treat the symptoms of prostate cancer. This medicine may also be used to treat children with early onset of puberty. It may be used for other hormonal conditions. This medicine may be used for other purposes; ask your health care provider or pharmacist if you have questions. COMMON BRAND NAME(S): Lupron What should I tell my health care provider before I take this medicine? They need to know if you have any of these conditions: -diabetes -heart disease or previous heart attack -high blood pressure -high cholesterol -pain or difficulty passing urine -spinal cord metastasis -stroke -tobacco smoker -an unusual or allergic reaction to leuprolide, benzyl alcohol, other medicines, foods, dyes, or preservatives -pregnant or trying to get pregnant -breast-feeding How should I use this medicine? This medicine is for injection under the skin or into a muscle. You will be taught how to prepare and give this medicine. Use exactly as directed. Take your medicine at regular intervals. Do not take your medicine more often than directed. It is important that you put your used needles and syringes in a special sharps container. Do not put them in a trash can. If you do not have a sharps container, call your pharmacist or healthcare provider to get one. A special MedGuide will be given to you by the pharmacist with each prescription and refill. Be sure to read this information carefully each time. Talk to your pediatrician regarding the use of this medicine in children. While this medicine may be prescribed for children as young as 8 years for selected conditions, precautions do apply. Overdosage: If you think you have taken too much of this medicine contact a poison control center or emergency room at once. NOTE: This medicine is only for you. Do not share this medicine with others. What if I miss a  dose? If you miss a dose, take it as soon as you can. If it is almost time for your next dose, take only that dose. Do not take double or extra doses. What may interact with this medicine? Do not take this medicine with any of the following medications: -chasteberry This medicine may also interact with the following medications: -herbal or dietary supplements, like black cohosh or DHEA -male hormones, like estrogens or progestins and birth control pills, patches, rings, or injections -male hormones, like testosterone This list may not describe all possible interactions. Give your health care provider a list of all the medicines, herbs, non-prescription drugs, or dietary supplements you use. Also tell them if you smoke, drink alcohol, or use illegal drugs. Some items may interact with your medicine. What should I watch for while using this medicine? Visit your doctor or health care professional for regular checks on your progress. During the first week, your symptoms may get worse, but then will improve as you continue your treatment. You may get hot flashes, increased bone pain, increased difficulty passing urine, or an aggravation of nerve symptoms. Discuss these effects with your doctor or health care professional, some of them may improve with continued use of this medicine. Male patients may experience a menstrual cycle or spotting during the first 2 months of therapy with this medicine. If this continues, contact your doctor or health care professional. What side effects may I notice from receiving this medicine? Side effects that you should report to your doctor or health care professional as soon as possible: -allergic reactions like skin rash, itching or  hives, swelling of the face, lips, or tongue -breathing problems -chest pain -depression or memory disorders -pain in your legs or groin -pain at site where injected -severe headache -swelling of the feet and legs -visual  changes -vomiting Side effects that usually do not require medical attention (report to your doctor or health care professional if they continue or are bothersome): -breast swelling or tenderness -decrease in sex drive or performance -diarrhea -hot flashes -loss of appetite -muscle, joint, or bone pains -nausea -redness or irritation at site where injected -skin problems or acne This list may not describe all possible side effects. Call your doctor for medical advice about side effects. You may report side effects to FDA at 1-800-FDA-1088. Where should I keep my medicine? Keep out of the reach of children. Store below 25 degrees C (77 degrees F). Do not freeze. Protect from light. Do not use if it is not clear or if there are particles present. Throw away any unused medicine after the expiration date. NOTE: This sheet is a summary. It may not cover all possible information. If you have questions about this medicine, talk to your doctor, pharmacist, or health care provider.  2018 Elsevier/Gold Standard (2016-03-10 10:54:35) Denosumab injection What is this medicine? DENOSUMAB (den oh sue mab) slows bone breakdown. Prolia is used to treat osteoporosis in women after menopause and in men. Xgeva is used to treat a high calcium level due to cancer and to prevent bone fractures and other bone problems caused by multiple myeloma or cancer bone metastases. Xgeva is also used to treat giant cell tumor of the bone. This medicine may be used for other purposes; ask your health care provider or pharmacist if you have questions. COMMON BRAND NAME(S): Prolia, XGEVA What should I tell my health care provider before I take this medicine? They need to know if you have any of these conditions: -dental disease -having surgery or tooth extraction -infection -kidney disease -low levels of calcium or Vitamin D in the blood -malnutrition -on hemodialysis -skin conditions or sensitivity -thyroid or  parathyroid disease -an unusual reaction to denosumab, other medicines, foods, dyes, or preservatives -pregnant or trying to get pregnant -breast-feeding How should I use this medicine? This medicine is for injection under the skin. It is given by a health care professional in a hospital or clinic setting. If you are getting Prolia, a special MedGuide will be given to you by the pharmacist with each prescription and refill. Be sure to read this information carefully each time. For Prolia, talk to your pediatrician regarding the use of this medicine in children. Special care may be needed. For Xgeva, talk to your pediatrician regarding the use of this medicine in children. While this drug may be prescribed for children as young as 13 years for selected conditions, precautions do apply. Overdosage: If you think you have taken too much of this medicine contact a poison control center or emergency room at once. NOTE: This medicine is only for you. Do not share this medicine with others. What if I miss a dose? It is important not to miss your dose. Call your doctor or health care professional if you are unable to keep an appointment. What may interact with this medicine? Do not take this medicine with any of the following medications: -other medicines containing denosumab This medicine may also interact with the following medications: -medicines that lower your chance of fighting infection -steroid medicines like prednisone or cortisone This list may not describe all possible interactions.   Give your health care provider a list of all the medicines, herbs, non-prescription drugs, or dietary supplements you use. Also tell them if you smoke, drink alcohol, or use illegal drugs. Some items may interact with your medicine. What should I watch for while using this medicine? Visit your doctor or health care professional for regular checks on your progress. Your doctor or health care professional may order  blood tests and other tests to see how you are doing. Call your doctor or health care professional for advice if you get a fever, chills or sore throat, or other symptoms of a cold or flu. Do not treat yourself. This drug may decrease your body's ability to fight infection. Try to avoid being around people who are sick. You should make sure you get enough calcium and vitamin D while you are taking this medicine, unless your doctor tells you not to. Discuss the foods you eat and the vitamins you take with your health care professional. See your dentist regularly. Brush and floss your teeth as directed. Before you have any dental work done, tell your dentist you are receiving this medicine. Do not become pregnant while taking this medicine or for 5 months after stopping it. Talk with your doctor or health care professional about your birth control options while taking this medicine. Women should inform their doctor if they wish to become pregnant or think they might be pregnant. There is a potential for serious side effects to an unborn child. Talk to your health care professional or pharmacist for more information. What side effects may I notice from receiving this medicine? Side effects that you should report to your doctor or health care professional as soon as possible: -allergic reactions like skin rash, itching or hives, swelling of the face, lips, or tongue -bone pain -breathing problems -dizziness -jaw pain, especially after dental work -redness, blistering, peeling of the skin -signs and symptoms of infection like fever or chills; cough; sore throat; pain or trouble passing urine -signs of low calcium like fast heartbeat, muscle cramps or muscle pain; pain, tingling, numbness in the hands or feet; seizures -unusual bleeding or bruising -unusually weak or tired Side effects that usually do not require medical attention (report to your doctor or health care professional if they continue or are  bothersome): -constipation -diarrhea -headache -joint pain -loss of appetite -muscle pain -runny nose -tiredness -upset stomach This list may not describe all possible side effects. Call your doctor for medical advice about side effects. You may report side effects to FDA at 1-800-FDA-1088. Where should I keep my medicine? This medicine is only given in a clinic, doctor's office, or other health care setting and will not be stored at home. NOTE: This sheet is a summary. It may not cover all possible information. If you have questions about this medicine, talk to your doctor, pharmacist, or health care provider.  2018 Elsevier/Gold Standard (2016-08-12 19:17:21)

## 2018-03-29 NOTE — Progress Notes (Signed)
Hematology and Oncology Follow Up Visit  Alejandro [name] [id number] 04/03/1956 62 y.o. 03/29/2018 10:10 AM [name], MDRusso, [name], MD   Principle Diagnosis: 62 year old man with prostate cancer diagnosed in 2015 with a PSA 6.4 and a Gleason score of 8.  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     Prior Therapy:   Status post biopsy done on July of 2015 followed by lymph node dissection completed in 2015 at [facility].  He was treated there with definitive radiation therapy utilizing seed implant as well as external beam radiation completed in 2016.  He developed advanced disease with bone involvement.  He received radiation therapy to the pelvis in 2017.  Xtandi 160 mg daily that was poorly tolerated and was discontinued.  He was switched to [name] which she took until 2019 and developed progression of disease with bony metastasis as well as PSA of 80.  Taxotere chemotherapy at 75 mg/m cycle 1 given in March 2019.  He is status post 6 cycles of chemotherapy last cycle given on February 03, 2018.  Current therapy:   Lupron 30 mg every 4 months with his next injection scheduled for March 29, 2018.  Xgeva 120 mg every 4 weeks to start on March 29, 2018.  Interim History:  Alejandro [name] is here for a follow-up visit.  Since the last visit, he reports no major changes in his health.  He still recovering from the effects of chemotherapy including loss of appetite, weight loss and fatigue.  He denies any bone pain or pathological fractures.  He does report some arthralgias associated with lack of prednisone.  He remains active and attends to activities of daily living.  He denies any urination difficulties.  He denies any recent hospitalizations or illnesses.   He denies any headaches, blurry vision, syncope or seizures.  He denies any dizziness. He does not report any chills or sweats.  He denies any chest  pain, palpitation, orthopnea or leg edema.  He does not report any cough, wheezing or hemoptysis.  Does not report any nausea vomiting or abdominal pain. Does not report any changes in his bowel habits.  He did not report any bone pain or pathological fractures.  He does not report any lymphadenopathy or petechiae. Does not report any skin rashes or lesions.  Denies any mood changes.  He denies any heat or cold intolerance..  Remaining review of system is negative.    Medications: I have reviewed the patient's current medications.  Current Outpatient Medications  Medication Sig Dispense Refill  . Alirocumab (PRALUENT Gwynn) Inject 75 mg into the skin every 14 (fourteen) days.     . ALPRAZolam (XANAX) 0.5 MG tablet Take 0.5 mg by mouth at bedtime.     Marland Kitchen amLODipine (NORVASC) 5 MG tablet Take 5 mg by mouth daily.     Marland Kitchen aspirin 325 MG EC tablet Take 325 mg by mouth daily.    . Butalbital-APAP-Caffeine (FIORICET) 50-300-40 MG CAPS Take one or two tablets every 4 hours as needed. (Patient taking differently: Take 1 tablet by mouth every 4 (four) hours as needed (headache). Take one or two tablets every 4 hours as needed.) 84 capsule 1  . BYSTOLIC 10 MG tablet Take 10 mg by mouth every evening.   4  . calcium-vitamin D (OSCAL WITH D) 500-200 MG-UNIT tablet Take 1 tablet by mouth 2 (two) times daily. 90 tablet 3  . dexamethasone (DECADRON) 4 MG tablet Take 2  tablets (8 mg total) by mouth 2 (two) times daily. Take 2 tablets BID for 3 Days before chemo, the day of chemo and the day after chemo 36 tablet 1  . ezetimibe (ZETIA) 10 MG tablet Take 10 mg by mouth daily.    Marland Kitchen loratadine (CLARITIN) 10 MG tablet Take 10 mg by mouth daily.    Marland Kitchen losartan (COZAAR) 50 MG tablet Take 50 mg by mouth daily.     . magic mouthwash w/lidocaine SOLN Take 5 mLs by mouth 4 (four) times daily as needed for mouth pain. 240 mL 2  . ondansetron (ZOFRAN) 8 MG tablet Take 8 mg by mouth every 8 (eight) hours as needed for nausea or  vomiting.     . tamsulosin (FLOMAX) 0.4 MG CAPS capsule Take 1 capsule (0.4 mg total) by mouth 2 (two) times daily. 60 capsule 3   No current facility-administered medications for this visit.    Facility-Administered Medications Ordered in Other Visits  Medication Dose Route Frequency Provider Last Rate Last Dose  . leuprolide (LUPRON) injection 30 mg  30 mg Intramuscular Once Wyatt Portela, MD         Allergies:  Allergies  Allergen Reactions  . Codeine Nausea Only    Past Medical History, Surgical history, Social history, and Family History were reviewed and updated.   Physical Exam: Blood pressure 116/68, pulse (!) 52, temperature 98.1 F (36.7 C), temperature source Oral, resp. rate 16, height 5\' 10"  (1.778 m), weight 250 lb 12.8 oz (113.8 kg), SpO2 99 %. ECOG: 0   General appearance: Comfortable appearing without any discomfort Head: Normocephalic without any trauma Oropharynx: Mucous membranes are moist and pink without any thrush or ulcers. Eyes: Pupils are equal and round reactive to light. Lymph nodes: No cervical, supraclavicular, inguinal or axillary lymphadenopathy.   Heart:regular rate and rhythm.  S1 and S2 without leg edema. Lung: Clear without any rhonchi or wheezes.  No dullness to percussion. Abdomin: Soft, nontender, nondistended with good bowel sounds.  No hepatosplenomegaly. Musculoskeletal: No joint deformity or effusion.  Full range of motion noted. Neurological: No deficits noted on motor, sensory and deep tendon reflex exam. Skin: No petechial rash or dryness.  Appeared moist.  Psychiatric: Mood and affect appeared appropriate.     Lab Results: Lab Results  Component Value Date   WBC 7.5 03/29/2018   HGB 13.3 03/29/2018   HCT 40.4 03/29/2018   MCV 94.0 03/29/2018   PLT 267 03/29/2018     Chemistry      Component Value Date/Time   NA 138 02/05/2018 1249   NA 135 (L) 06/13/2014 0944   K 3.9 02/05/2018 1249   K 5.4 (H) 06/13/2014 0944    CL 101 02/05/2018 1249   CO2 27 02/05/2018 1249   CO2 25 06/13/2014 0944   BUN 20 02/05/2018 1249   BUN 18.8 06/13/2014 0944   CREATININE 1.13 02/05/2018 1249   CREATININE 1.43 (H) 02/03/2018 1318   CREATININE 1.0 06/13/2014 0944      Component Value Date/Time   CALCIUM 9.2 02/05/2018 1249   CALCIUM 10.0 06/13/2014 0944   ALKPHOS 94 02/05/2018 1249   ALKPHOS 96 06/13/2014 0944   AST 24 02/05/2018 1249   AST 21 02/03/2018 1318   AST 104 (H) 06/13/2014 0944   ALT 33 02/05/2018 1249   ALT 32 02/03/2018 1318   ALT 126 (H) 06/13/2014 0944   BILITOT 0.6 02/05/2018 1249   BILITOT 0.3 02/03/2018 1318   BILITOT 0.63 06/13/2014  0944      Results for ARRIN, PINTOR (MRN [id number]) as of 03/29/2018 10:16  Ref. 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  He completed 6 cycles of chemotherapy in July 2883 with few complications.  His PSA nadir is around 20 after decline of more than 50%.  Imaging studies obtained at [facility] in [place] showed his bone disease is stable with decrease in his lymphadenopathy.  The natural course of this disease and treatment options were reviewed again.  These options would include Jevtana, radium 223 or potentially clinical trial at [facility].  For the time being, he has opted to continue with active surveillance and treatment holiday and will consider these options in the future.  2.  Bone directed therapy: Risks and benefits of Delton See was reviewed today.  He had dental clearance without any recent dental complications.  Issues associated with Xgeva including hypocalcemia, arthralgias, myalgias and rarely osteonecrosis of the jaw.  After discussion today he will proceed with therapy and  repeated every 4 to 5 weeks.  3.  Androgen depravation: He received Lupron at 30 mg on Dec 04, 2017.  He will receive Lupron on 03/29/2018 and every 4 months after that.  4. Follow-up: We will be in 4 to 5 weeks to follow his progress and repeat PSA.  25 minutes was spent with the patient face-to-face today.  More than 50% of time was dedicated to reviewing the natural course of his disease, reviewing imaging studies, reviewing laboratory data and discussing treatment options.       Zola Button, MD 8/26/201910:10 AM

## 2018-03-29 NOTE — Telephone Encounter (Signed)
Gave Pt calendar of upcoming oct appts.

## 2018-03-30 LAB — PROSTATE-SPECIFIC AG, SERUM (LABCORP): Prostate Specific Ag, Serum: 83.9 ng/mL — ABNORMAL HIGH (ref 0.0–4.0)

## 2018-05-03 ENCOUNTER — Telehealth: Payer: Self-pay | Admitting: *Deleted

## 2018-05-03 NOTE — Telephone Encounter (Signed)
Medical Records faxed to [facility]; release [phone]

## 2018-05-04 ENCOUNTER — Inpatient Hospital Stay: Payer: 59 | Attending: Oncology

## 2018-05-04 ENCOUNTER — Inpatient Hospital Stay: Payer: 59 | Admitting: Oncology

## 2018-05-04 ENCOUNTER — Inpatient Hospital Stay: Payer: 59

## 2018-05-04 MED ORDER — DENOSUMAB 120 MG/1.7ML ~~LOC~~ SOLN
SUBCUTANEOUS | Status: AC
  Filled 2018-05-04: qty 1.7

## 2018-05-06 ENCOUNTER — Telehealth: Payer: Self-pay | Admitting: *Deleted

## 2018-05-06 NOTE — Telephone Encounter (Signed)
Received voice mail message from patient stating,"I wanted to update Dr. Alen Blew on what is going on with me. I have applied for a clinical trial in [place]. If I get admitted to the trial, I will be doing my treatment in [place]. If I don't get admitted, then I will have to find something else for treatment. If he has any questions or wants to talk with me, my number is [phone]."

## 2018-05-11 ENCOUNTER — Other Ambulatory Visit: Payer: Self-pay | Admitting: Internal Medicine

## 2018-05-11 DIAGNOSIS — I714 Abdominal aortic aneurysm, without rupture, unspecified: Secondary | ICD-10-CM

## 2018-06-21 ENCOUNTER — Ambulatory Visit (INDEPENDENT_AMBULATORY_CARE_PROVIDER_SITE_OTHER): Payer: 59 | Admitting: Psychology

## 2018-06-21 DIAGNOSIS — F4323 Adjustment disorder with mixed anxiety and depressed mood: Secondary | ICD-10-CM | POA: Diagnosis not present

## 2018-06-22 ENCOUNTER — Telehealth: Payer: Self-pay | Admitting: *Deleted

## 2018-06-22 NOTE — Telephone Encounter (Signed)
Faxed copy of all lupron injections for 2019 to dr Verta Ellen in [place] [phone]

## 2018-07-07 ENCOUNTER — Other Ambulatory Visit: Payer: Self-pay | Admitting: Oncology

## 2018-07-07 DIAGNOSIS — C61 Malignant neoplasm of prostate: Secondary | ICD-10-CM

## 2018-07-07 DIAGNOSIS — C7951 Secondary malignant neoplasm of bone: Principal | ICD-10-CM

## 2018-07-19 ENCOUNTER — Telehealth: Payer: Self-pay | Admitting: Oncology

## 2018-07-19 NOTE — Telephone Encounter (Signed)
Faxed medical records to [facility], Release [phone]

## 2018-08-06 ENCOUNTER — Other Ambulatory Visit: Payer: Self-pay | Admitting: Oncology

## 2018-08-06 DIAGNOSIS — C61 Malignant neoplasm of prostate: Secondary | ICD-10-CM

## 2018-08-06 DIAGNOSIS — C7951 Secondary malignant neoplasm of bone: Principal | ICD-10-CM

## 2018-09-05 ENCOUNTER — Other Ambulatory Visit: Payer: Self-pay | Admitting: Oncology

## 2018-09-05 DIAGNOSIS — C7951 Secondary malignant neoplasm of bone: Principal | ICD-10-CM

## 2018-09-05 DIAGNOSIS — C61 Malignant neoplasm of prostate: Secondary | ICD-10-CM

## 2018-09-06 ENCOUNTER — Telehealth: Payer: Self-pay | Admitting: Oncology

## 2018-09-06 NOTE — Telephone Encounter (Signed)
Faxed medical records to [place], Release [phone]

## 2018-09-28 ENCOUNTER — Telehealth: Payer: Self-pay

## 2018-09-28 NOTE — Telephone Encounter (Signed)
L/M for Mr. Alexopoulos to schedule ov per JG. Message in [employer].

## 2018-09-30 NOTE — Telephone Encounter (Signed)
appt schedule for 11/01/2018 @ 3:45pm

## 2018-10-05 ENCOUNTER — Other Ambulatory Visit: Payer: Self-pay | Admitting: Oncology

## 2018-10-05 DIAGNOSIS — C61 Malignant neoplasm of prostate: Secondary | ICD-10-CM

## 2018-10-05 DIAGNOSIS — C7951 Secondary malignant neoplasm of bone: Principal | ICD-10-CM

## 2018-11-01 ENCOUNTER — Ambulatory Visit: Payer: Self-pay | Admitting: Cardiology

## 2018-11-04 ENCOUNTER — Other Ambulatory Visit: Payer: Self-pay | Admitting: Oncology

## 2018-11-04 DIAGNOSIS — C7951 Secondary malignant neoplasm of bone: Principal | ICD-10-CM

## 2018-11-04 DIAGNOSIS — C61 Malignant neoplasm of prostate: Secondary | ICD-10-CM

## 2018-11-24 ENCOUNTER — Telehealth: Payer: Self-pay

## 2018-11-24 NOTE — Telephone Encounter (Signed)
Patient called and stated he last saw Dr. Alen Blew in August 2019. Patient is currently in a clinical trial in [place] but due to COVID 19 has not been able to continue. Patient states he is due for scans, Lupron, and XGEVA. Patient states he has requested records to be faxed to Dr. Alen Blew. Dr. Alen Blew will assess once records are received. Patient stated records should be received by tomorrow and he will call the [facility] to confirm.

## 2018-11-25 ENCOUNTER — Other Ambulatory Visit: Payer: Self-pay | Admitting: Oncology

## 2018-11-25 DIAGNOSIS — C61 Malignant neoplasm of prostate: Secondary | ICD-10-CM

## 2018-11-25 DIAGNOSIS — C7951 Secondary malignant neoplasm of bone: Principal | ICD-10-CM

## 2018-11-29 ENCOUNTER — Telehealth: Payer: Self-pay | Admitting: Oncology

## 2018-11-29 NOTE — Telephone Encounter (Signed)
Scheduled appt per 4/24 sch message - pt is aware of appt date and time

## 2018-12-03 ENCOUNTER — Ambulatory Visit (INDEPENDENT_AMBULATORY_CARE_PROVIDER_SITE_OTHER): Payer: BLUE CROSS/BLUE SHIELD | Admitting: Cardiology

## 2018-12-03 ENCOUNTER — Encounter: Payer: Self-pay | Admitting: Cardiology

## 2018-12-03 ENCOUNTER — Other Ambulatory Visit: Payer: Self-pay

## 2018-12-03 VITALS — BP 127/79 | HR 54 | Ht 70.0 in | Wt 230.0 lb

## 2018-12-03 DIAGNOSIS — I10 Essential (primary) hypertension: Secondary | ICD-10-CM

## 2018-12-03 DIAGNOSIS — G4733 Obstructive sleep apnea (adult) (pediatric): Secondary | ICD-10-CM

## 2018-12-03 DIAGNOSIS — Z9989 Dependence on other enabling machines and devices: Secondary | ICD-10-CM

## 2018-12-03 DIAGNOSIS — I700 Atherosclerosis of aorta: Secondary | ICD-10-CM | POA: Insufficient documentation

## 2018-12-03 DIAGNOSIS — I951 Orthostatic hypotension: Secondary | ICD-10-CM | POA: Insufficient documentation

## 2018-12-03 NOTE — Progress Notes (Signed)
Patient not seen by me, I missed the meeting, will reschedule

## 2018-12-04 ENCOUNTER — Other Ambulatory Visit: Payer: Self-pay | Admitting: Oncology

## 2018-12-04 ENCOUNTER — Other Ambulatory Visit: Payer: Self-pay | Admitting: Cardiology

## 2018-12-04 DIAGNOSIS — C7951 Secondary malignant neoplasm of bone: Principal | ICD-10-CM

## 2018-12-04 DIAGNOSIS — C61 Malignant neoplasm of prostate: Secondary | ICD-10-CM

## 2018-12-06 NOTE — Telephone Encounter (Signed)
Please fill

## 2018-12-08 ENCOUNTER — Telehealth: Payer: Self-pay

## 2018-12-08 NOTE — Telephone Encounter (Signed)
Bystolic 10 mg is not covered by patients insurance, if clinically appropriate can we change the script?

## 2018-12-08 NOTE — Telephone Encounter (Signed)
He has been on it for awhile and has Bystolic has been the only one he has tolerated in the past I believe.

## 2018-12-14 ENCOUNTER — Ambulatory Visit (HOSPITAL_COMMUNITY): Admission: RE | Admit: 2018-12-14 | Payer: BLUE CROSS/BLUE SHIELD | Source: Ambulatory Visit

## 2018-12-14 ENCOUNTER — Telehealth: Payer: Self-pay

## 2018-12-14 ENCOUNTER — Ambulatory Visit (HOSPITAL_COMMUNITY): Payer: BLUE CROSS/BLUE SHIELD | Attending: Oncology

## 2018-12-14 ENCOUNTER — Inpatient Hospital Stay: Payer: BLUE CROSS/BLUE SHIELD

## 2018-12-14 ENCOUNTER — Encounter (HOSPITAL_COMMUNITY): Payer: BLUE CROSS/BLUE SHIELD

## 2018-12-14 NOTE — Telephone Encounter (Signed)
Received after hour message that patient called regarding fevers but no other symptoms. Followed up with patient and he stated that his temp has remained at 99.6 but he is taking Tylenol to manage the fever. He stated that he has had headaches and joint pain. Discussed scheduled appointments for the day and patient stated that he would rather wait and call back to get appointments rescheduled when he feels better. He stated that he has an appointment in [place] at the end of May for follow up care. Encouraged patient to contact PCP if temperatures or other symptoms worsen. Patient verbalized understanding.

## 2018-12-15 ENCOUNTER — Telehealth: Payer: Self-pay

## 2018-12-15 NOTE — Telephone Encounter (Signed)
Received message from patient requesting to reschedule missed appointments from yesterday due to not feeling well. Scheduling message sent to reschedule lab and injection appointments. Message left with patient with central scheduling number and to get scans reschedule once he has been given the date and time of the lab appointment since this will be needed prior to the scan. To call back with any questions or concerns.

## 2018-12-17 ENCOUNTER — Telehealth: Payer: Self-pay | Admitting: Oncology

## 2018-12-17 ENCOUNTER — Ambulatory Visit: Payer: BLUE CROSS/BLUE SHIELD | Admitting: Cardiology

## 2018-12-17 NOTE — Telephone Encounter (Signed)
Spoke with patient per 5/15 sch message - pt is aware of apt added for 5/20

## 2018-12-20 ENCOUNTER — Other Ambulatory Visit: Payer: Self-pay

## 2018-12-20 ENCOUNTER — Encounter: Payer: Self-pay | Admitting: Cardiology

## 2018-12-20 ENCOUNTER — Ambulatory Visit: Payer: BLUE CROSS/BLUE SHIELD | Admitting: Cardiology

## 2018-12-20 VITALS — BP 131/85 | HR 57 | Ht 70.0 in | Wt 230.0 lb

## 2018-12-20 DIAGNOSIS — E7800 Pure hypercholesterolemia, unspecified: Secondary | ICD-10-CM | POA: Diagnosis not present

## 2018-12-20 DIAGNOSIS — I2510 Atherosclerotic heart disease of native coronary artery without angina pectoris: Secondary | ICD-10-CM

## 2018-12-20 DIAGNOSIS — I10 Essential (primary) hypertension: Secondary | ICD-10-CM

## 2018-12-20 DIAGNOSIS — I700 Atherosclerosis of aorta: Secondary | ICD-10-CM

## 2018-12-20 DIAGNOSIS — R0600 Dyspnea, unspecified: Secondary | ICD-10-CM

## 2018-12-20 DIAGNOSIS — R0609 Other forms of dyspnea: Secondary | ICD-10-CM | POA: Diagnosis not present

## 2018-12-20 NOTE — Progress Notes (Signed)
Virtual Visit via Video Note: This visit type was conducted due to national recommendations for restrictions regarding the COVID-19 Pandemic (e.g. social distancing).  This format is felt to be most appropriate for this patient at this time.  All issues noted in this document were discussed and addressed.  No physical exam was performed (except for noted visual exam findings with Tele health visits).  The patient has consented to conduct a Tele health visit and understands insurance will be billed.   I connected with@, on 12/21/18 at  by a video enabled telemedicine application and verified that I am speaking with the correct person using two identifiers.   I discussed the limitations of evaluation and management by telemedicine and the availability of in person appointments. The patient expressed understanding and agreed to proceed.   I have discussed with patient regarding the safety during COVID Pandemic and steps and precautions to be taken including social distancing, frequent hand wash and use of detergent soap, gels with the patient. I asked the patient to avoid touching mouth, nose, eyes, ears with the hands. I encouraged regular walking around the neighborhood and exercise and regular diet, as long as social distancing can be maintained.  Primary Physician/Referring:  [name], MD  Patient ID: Alejandro [name]., male    DOB: [birth year], 63 y.o.   MRN: [id number]  Chief Complaint  Patient presents with   Hypertension   Aortic Stenosis   Follow-up    59yr    HPI: Alejandro [name].  is a 63 y.o. male  with  coronary atherosclerosis, coronary angiography in 10/13/2007 had revealed occluded RCA. He had mild disease diffusely in the other vessels. as well as multiple comorbidity including dyslipidemia, hypertension, OSA on CPAP and moderate obesity as well as an abdominal aortic atherosclerosis with severe calcification. He also has chronic orthostatic hypotension and  supine hypertension.   Patient with fairly aggressive prostate cancer, he is being treated at [facility] in [place] and locally follows Dr. Kathie Rhodes, has metastatic prostate cancer. He has history of radiation therapy to his right hip.  Patient is here on 9 month office visit for hypertension and orthostatic hypotension. Unfortunately, his prostate cancer has continued to progress and has recently started with IV chemotherapy. Other than feeling tired and weak from chemotherapy, he is doing well.   He has developed back pain and knee arthritis and still tries to walk a mile a day, but has noticed reduced exercise tolerance in spite of loosing about 50 Lbs and is worried about progression of CAD and this was the symptoms on his last diagnosis of CAD. He denies chest pain. He is enrolled in the clinical trial at [facility] and is in clinical trial.   He has not been able to get Praluent due to insurance reasons. He wants to restart PCSK9 inhibitors. Lipids have not been checked. Continue to have occasional dizziness and states occasionally he has noticed decrease in BP in high 90 systolic.   Past Medical History:  Diagnosis Date   ANXIETY    AORTIC STENOSIS, MILD    CHEST PAIN-UNSPECIFIED    DYSPNEA    HYPERCHOLESTEROLEMIA    Hypertension    INSOMNIA    OSA on CPAP 05/31/2013   OSTEOARTHRITIS, KNEE    Prostate cancer [facility])     Past Surgical History:  Procedure Laterality Date   COLONOSCOPY     ELBOW SURGERY     LUMBAR LAMINECTOMY  Late 1990's   NEUROPLASTY / TRANSPOSITION MEDIAN NERVE AT CARPAL TUNNEL BILATERAL     PROSTATE BIOPSY      Social History   Socioeconomic History   Marital status: Divorced    Spouse name: Not on file   Number of children: 1   Years of education: college   Highest education level: Not on file  Occupational History   Occupation: CONSULTANT    Employer: [employer]  Social Needs   Financial resource strain: Not on file   Food insecurity:    Worry: Not on file    Inability: Not on file   Transportation needs:    Medical: Not on file    Non-medical: Not on file  Tobacco Use   Smoking status: Former Smoker    Packs/day: 3.00    Years: 25.00    Pack years: 75.00    Types: Cigars, Cigarettes    Last attempt to quit: 12/03/1998    Years since quitting: 20.0   Smokeless tobacco: Never Used   Tobacco comment: Former 3 ppd smoker for 30 years quit heavy smoking 15 years ago  Substance and Sexual Activity   Alcohol use: Yes    Alcohol/week: 12.0 standard drinks    Types: 12 Standard drinks or equivalent per week    Comment: 2 drinks per day   Drug use: No   Sexual activity: Yes  Lifestyle   Physical activity:    Days per week: Not on file    Minutes per session: Not on file   Stress: Not on file  Relationships   Social connections:    Talks on phone: Not on file    Gets together: Not on file    Attends religious service: Not on file    Active member of club or organization: Not on file    Attends meetings of clubs or organizations: Not on file    Relationship status: Not on file   Intimate partner violence:    Fear of current or ex partner: Not on file    Emotionally abused: Not on file    Physically abused: Not on file    Forced sexual activity: Not on file  Other Topics Concern   Not on file  Social History Narrative   Not on file    Current Outpatient Medications on File Prior to Visit  Medication Sig Dispense Refill   Alirocumab (PRALUENT [place]) Inject 75 mg into the skin every 14 (fourteen) days.      amitriptyline (ELAVIL) 10 MG tablet daily.     amLODipine (NORVASC) 5 MG tablet TAKE 1 TABLET BY MOUTH DAILY 90 tablet 0   aspirin 325 MG EC tablet Take 325 mg by mouth daily.     BYSTOLIC 10 MG tablet TAKE 1 TABLET BY MOUTH AT BEDTIME 90 tablet 0   calcium-vitamin D (OSCAL WITH D) 500-200 MG-UNIT  tablet Take 1 tablet by mouth 2 (two) times daily. 90 tablet 3   cholecalciferol (VITAMIN D3) 25 MCG (1000 UT) tablet Take 1,000 Units by mouth daily.     ezetimibe (ZETIA) 10 MG tablet Take 10 mg by mouth daily.     fluticasone (FLONASE) 50 MCG/ACT nasal spray Place 1-2 sprays into both nostrils daily.     loratadine (CLARITIN) 10 MG tablet Take 10 mg by mouth daily.     losartan (COZAAR) 50 MG tablet Take 50 mg by mouth daily.      NUBEQA 300 MG TABS 2 tabs bid  tamsulosin (FLOMAX) 0.4 MG CAPS capsule TAKE ONE CAPSULE BY MOUTH TWICE DAILY 60 capsule 0   No current facility-administered medications on file prior to visit.     Review of Systems  Constitution: Positive for malaise/fatigue and weight loss (50 Lbs and intentional). Negative for chills, decreased appetite and weight gain.  Cardiovascular: Positive for dyspnea on exertion (worsening). Negative for leg swelling and syncope.  Endocrine: Negative for cold intolerance.  Hematologic/Lymphatic: Does not bruise/bleed easily.  Musculoskeletal: Negative for joint swelling.  Gastrointestinal: Negative for abdominal pain, anorexia and change in bowel habit.  Genitourinary: Positive for decreased libido and frequency.  Neurological: Positive for dizziness. Negative for headaches and light-headedness.  Psychiatric/Behavioral: Negative for depression and substance abuse.  All other systems reviewed and are negative.     Objective  Blood pressure 131/85, pulse (!) 57, height 5\' 10"  (1.778 m), weight 230 lb (104.3 kg). Body mass index is 33 kg/m.    Physical exam not performed or limited due to virtual visit.  Patient appeared to be in no distress, Neck was supple, respiration was not labored.  Please see exam details from prior visit is as below.  Physical Exam  Constitutional: He appears well-developed. No distress.  Moderately obese  HENT:  Head: Atraumatic.  Eyes: Conjunctivae are normal.  Neck: Neck supple. No JVD  present. No thyromegaly present.  Cardiovascular: Normal rate, regular rhythm, normal heart sounds and intact distal pulses. Exam reveals no gallop.  No murmur heard. Pulmonary/Chest: Effort normal and breath sounds normal.  Abdominal: Soft. Bowel sounds are normal.  Musculoskeletal: Normal range of motion.        General: No edema.  Neurological: He is alert.  Skin: Skin is warm and dry.  Psychiatric: He has a normal mood and affect.   Radiology: No results found.  Laboratory examination:    CMP Latest Ref Rng & Units 03/29/2018 02/05/2018 02/03/2018  Glucose 70 - 99 mg/dL 156(H) 106(H) 153(H)  BUN 8 - 23 mg/dL 8 20 27(H)  Creatinine 0.61 - 1.24 mg/dL 1.10 1.13 1.43(H)  Sodium 135 - 145 mmol/L 137 138 134(L)  Potassium 3.5 - 5.1 mmol/L 4.1 3.9 4.4  Chloride 98 - 111 mmol/L 101 101 100  CO2 22 - 32 mmol/L 24 27 23   Calcium 8.9 - 10.3 mg/dL 9.9 9.2 9.8  Total Protein 6.5 - 8.1 g/dL 7.5 6.4(L) 6.9  Total Bilirubin 0.3 - 1.2 mg/dL 0.5 0.6 0.3  Alkaline Phos 38 - 126 U/L 110 94 115  AST 15 - 41 U/L 18 24 21   ALT 0 - 44 U/L 24 33 32   CBC Latest Ref Rng & Units 03/29/2018 02/05/2018 02/03/2018  WBC 4.0 - 10.3 K/uL 7.5 5.3 13.0(H)  Hemoglobin 13.0 - 17.1 g/dL 13.3 11.9(L) 11.5(L)  Hematocrit 38.4 - 49.9 % 40.4 35.2(L) 34.5(L)  Platelets 140 - 400 K/uL 267 255 283   Lipid Panel  No results found for: CHOL, TRIG, HDL, CHOLHDL, VLDL, LDLCALC, LDLDIRECT HEMOGLOBIN A1C No results found for: HGBA1C, MPG TSH No results for input(s): TSH in the last 8760 hours.  Cardiac Studies:   Coronary angiogram 10/13/2007: Mild diffuse CAD involving LAD and circumflex coronary artery.  RCA occluded in the proximal segment, with left-to-right and right-to-left right region collaterals.  Ejection fraction 55% with mild inferobasal hypokinesis.  Mild aortic stenosis.  Lexiscan sestamibi stress test 03/17/2014: 1. Resting EKG demonstrated normal sinus rhythm. Initially patient exercised for 8 minutes and 35  seconds on a Bruce protocol and achieved  13.4 METs. However patient achieved only 73% of age-predicted maximum heart rate, hence stress test had to be terminated and switched over to [place] sestamibi stress test. There is no ST-T wave changes for ischemia with exercise stress test, Lexiscan stress EKG revealed no ST-T wave changes ischemia. 2. The quality of the stress images was good. There is minimal inferior wall diaphragmatic attenuation artifact without evidence of ischemia. Left ventricle systolic function Related by QGS was 64%. The left ventricle was normal in size both in rest and stress images. This is a low risk study.  Echocardiogram [12/03/2016]: Left ventricle cavity is normal in size. Mild concentric hypertrophy of the left ventricle. Normal global wall motion. Normal diastolic filling pattern. Calculated EF 66%. Left atrial cavity is moderately dilated at 4.7 cm. Mild aortic valve leaflet thickening with mild calcification. No evidence of aortic valve stenosis. Mild to moderate aortic regurgitation. Mild (Grade I) mitral regurgitation. Mild tricuspid regurgitation. Mild pulmonary hypertension. Pulmonary artery systolic pressure is estimated at 34 mm Hg. The aortic root is mildly dilated at 4.0 cm. Compared to the study done on 03/30/2014, previously aortic regurgitation was very mild. Pulmonary hypertension is new.   Assessment   Coronary artery disease involving native coronary artery of native heart without angina pectoris - 10/13/2007: Mild diffuse LAD and circumflex coronary artery.  RCA occluded, with left-to-right and right-to-left right region collaterals. - Plan: PCV ECHOCARDIOGRAM COMPLETE, PCV MYOCARDIAL PERFUSION WO LEXISCAN  Dyspnea on exertion - Plan: PCV ECHOCARDIOGRAM COMPLETE, PCV MYOCARDIAL PERFUSION WO LEXISCAN  Essential hypertension  Hypercholesteremia - Plan: Lipid Panel With LDL/HDL Ratio, TSH  Abdominal aortic atherosclerosis (HCC)  EKG 11/27/2017: Sinus  bradycardia at 50 bpm, left atrial enlargement, normal axis, LVH.  No evidence of ischemia.  One PAC.  Recommendations:   Patient was scheduled to see [place] back in 3 months after his last office visit: However, due to complications with his prostate cancer he is now here on a 9 month office visit.   From a cardiac standpoint, he has noticed worsening shortness of breath and also decreased exercise tolerance and has noticed some discomfort in chest and states it is similar to his prior presentation.   Blood pressure has remained stable. Does have a history of orthostatic hypertension History, in view of occasional dizziness, advised him to take amlodipine at night along with Bystolic and See if will make a difference and he'll continue losartan in the morning.   Will schedule him for a exercise sestamibi stress test and also repeat echocardiogram as his pain 5 years since last stress test.  He has not tolerated statins in the past with severe myalgias and arthralgias.  He has very severe abdominal aortic atherosclerosis, known coronary artery disease, he wants to restart PCSK9 9 inhibitors, previously he had difficulty getting prior authorization.  I'll obtain lipid profile testing. I will see him back after the test and make further recommendations.   Adrian Prows, MD, [facility] 12/21/2018, 3:45 AM [facility] [place] Pager: [phone] Office: [phone] If no answer Cell [phone]

## 2018-12-21 ENCOUNTER — Encounter: Payer: Self-pay | Admitting: Cardiology

## 2018-12-22 ENCOUNTER — Other Ambulatory Visit: Payer: Self-pay

## 2018-12-22 ENCOUNTER — Inpatient Hospital Stay: Payer: BLUE CROSS/BLUE SHIELD

## 2018-12-22 ENCOUNTER — Ambulatory Visit
Admission: RE | Admit: 2018-12-22 | Discharge: 2018-12-22 | Disposition: A | Discharge status: Home / Self Care | Payer: BLUE CROSS/BLUE SHIELD | Source: Ambulatory Visit | Attending: Oncology | Admitting: Oncology

## 2018-12-22 ENCOUNTER — Inpatient Hospital Stay: Payer: BLUE CROSS/BLUE SHIELD | Attending: Oncology

## 2018-12-22 VITALS — BP 156/83 | HR 53 | Temp 97.5°F | Resp 18

## 2018-12-22 DIAGNOSIS — C7951 Secondary malignant neoplasm of bone: Secondary | ICD-10-CM

## 2018-12-22 DIAGNOSIS — R42 Dizziness and giddiness: Secondary | ICD-10-CM | POA: Insufficient documentation

## 2018-12-22 DIAGNOSIS — Z7951 Long term (current) use of inhaled steroids: Secondary | ICD-10-CM | POA: Insufficient documentation

## 2018-12-22 DIAGNOSIS — I700 Atherosclerosis of aorta: Secondary | ICD-10-CM | POA: Diagnosis not present

## 2018-12-22 DIAGNOSIS — Z7982 Long term (current) use of aspirin: Secondary | ICD-10-CM | POA: Insufficient documentation

## 2018-12-22 DIAGNOSIS — C61 Malignant neoplasm of prostate: Secondary | ICD-10-CM

## 2018-12-22 DIAGNOSIS — Z192 Hormone resistant malignancy status: Secondary | ICD-10-CM | POA: Insufficient documentation

## 2018-12-22 DIAGNOSIS — Z923 Personal history of irradiation: Secondary | ICD-10-CM | POA: Diagnosis not present

## 2018-12-22 DIAGNOSIS — Z79899 Other long term (current) drug therapy: Secondary | ICD-10-CM | POA: Diagnosis not present

## 2018-12-22 DIAGNOSIS — J439 Emphysema, unspecified: Secondary | ICD-10-CM | POA: Insufficient documentation

## 2018-12-22 DIAGNOSIS — Z79818 Long term (current) use of other agents affecting estrogen receptors and estrogen levels: Secondary | ICD-10-CM | POA: Insufficient documentation

## 2018-12-22 DIAGNOSIS — Z9221 Personal history of antineoplastic chemotherapy: Secondary | ICD-10-CM | POA: Diagnosis not present

## 2018-12-22 LAB — CMP (CANCER CENTER ONLY)
ALT: 23 U/L (ref 0–44)
AST: 30 U/L (ref 15–41)
Albumin: 3.5 g/dL (ref 3.5–5.0)
Alkaline Phosphatase: 80 U/L (ref 38–126)
Anion gap: 12 (ref 5–15)
BUN: 14 mg/dL (ref 8–23)
CO2: 24 mmol/L (ref 22–32)
Calcium: 9.3 mg/dL (ref 8.9–10.3)
Chloride: 100 mmol/L (ref 98–111)
Creatinine: 0.93 mg/dL (ref 0.61–1.24)
GFR, Est AFR Am: >60 mL/min (ref >60)
GFR, Estimated: >60 mL/min (ref >60)
Glucose, Bld: 107 mg/dL — ABNORMAL HIGH (ref 70–99)
Potassium: 3.9 mmol/L (ref 3.5–5.1)
Sodium: 136 mmol/L (ref 135–145)
Total Bilirubin: 0.4 mg/dL (ref 0.3–1.2)
Total Protein: 7.4 g/dL (ref 6.5–8.1)

## 2018-12-22 LAB — CBC WITH DIFFERENTIAL (CANCER CENTER ONLY)
Abs Immature Granulocytes: 0.04 10*3/uL (ref 0.00–0.07)
Basophils Absolute: 0 10*3/uL (ref 0.0–0.1)
Basophils Relative: 1 %
Eosinophils Absolute: 0.1 10*3/uL (ref 0.0–0.5)
Eosinophils Relative: 2 %
HCT: 35.6 % — ABNORMAL LOW (ref 39.0–52.0)
Hemoglobin: 11.8 g/dL — ABNORMAL LOW (ref 13.0–17.0)
Immature Granulocytes: 1 %
Lymphocytes Relative: 17 %
Lymphs Abs: 1 10*3/uL (ref 0.7–4.0)
MCH: 30.8 pg (ref 26.0–34.0)
MCHC: 33.1 g/dL (ref 30.0–36.0)
MCV: 93 fL (ref 80.0–100.0)
Monocytes Absolute: 0.6 10*3/uL (ref 0.1–1.0)
Monocytes Relative: 11 %
Neutro Abs: 3.9 10*3/uL (ref 1.7–7.7)
Neutrophils Relative %: 68 %
Platelet Count: 283 10*3/uL (ref 150–400)
RBC: 3.83 MIL/uL — ABNORMAL LOW (ref 4.22–5.81)
RDW: 12 % (ref 11.5–15.5)
WBC Count: 5.6 10*3/uL (ref 4.0–10.5)
nRBC: 0 % (ref 0.0–0.2)

## 2018-12-22 MED ORDER — IOPAMIDOL (ISOVUE-300) INJECTION 61%
125.0000 mL | Freq: Once | INTRAVENOUS | Status: AC | PRN
  Administered 2018-12-22: 125 mL via INTRAVENOUS

## 2018-12-22 MED ORDER — DENOSUMAB 120 MG/1.7ML ~~LOC~~ SOLN
120.0000 mg | Freq: Once | SUBCUTANEOUS | Status: AC
  Administered 2018-12-22: 120 mg via SUBCUTANEOUS

## 2018-12-22 MED ORDER — LEUPROLIDE ACETATE (4 MONTH) 30 MG IM KIT
30.0000 mg | PACK | Freq: Once | INTRAMUSCULAR | Status: AC
  Administered 2018-12-22: 30 mg via INTRAMUSCULAR
  Filled 2018-12-22: qty 30

## 2018-12-22 MED ORDER — DENOSUMAB 120 MG/1.7ML ~~LOC~~ SOLN
SUBCUTANEOUS | Status: AC
  Filled 2018-12-22: qty 1.7

## 2018-12-22 NOTE — Patient Instructions (Signed)
Denosumab injection What is this medicine? DENOSUMAB (den oh sue mab) slows bone breakdown. Prolia is used to treat osteoporosis in women after menopause and in men, and in people who are taking corticosteroids for 6 months or more. Xgeva is used to treat a high calcium level due to cancer and to prevent bone fractures and other bone problems caused by multiple myeloma or cancer bone metastases. Xgeva is also used to treat giant cell tumor of the bone. This medicine may be used for other purposes; ask your health care provider or pharmacist if you have questions. COMMON BRAND NAME(S): Prolia, XGEVA What should I tell my health care provider before I take this medicine? They need to know if you have any of these conditions: -dental disease -having surgery or tooth extraction -infection -kidney disease -low levels of calcium or Vitamin D in the blood -malnutrition -on hemodialysis -skin conditions or sensitivity -thyroid or parathyroid disease -an unusual reaction to denosumab, other medicines, foods, dyes, or preservatives -pregnant or trying to get pregnant -breast-feeding How should I use this medicine? This medicine is for injection under the skin. It is given by a health care professional in a hospital or clinic setting. A special MedGuide will be given to you before each treatment. Be sure to read this information carefully each time. For Prolia, talk to your pediatrician regarding the use of this medicine in children. Special care may be needed. For Xgeva, talk to your pediatrician regarding the use of this medicine in children. While this drug may be prescribed for children as young as 13 years for selected conditions, precautions do apply. Overdosage: If you think you have taken too much of this medicine contact a poison control center or emergency room at once. NOTE: This medicine is only for you. Do not share this medicine with others. What if I miss a dose? It is important not to  miss your dose. Call your doctor or health care professional if you are unable to keep an appointment. What may interact with this medicine? Do not take this medicine with any of the following medications: -other medicines containing denosumab This medicine may also interact with the following medications: -medicines that lower your chance of fighting infection -steroid medicines like prednisone or cortisone This list may not describe all possible interactions. Give your health care provider a list of all the medicines, herbs, non-prescription drugs, or dietary supplements you use. Also tell them if you smoke, drink alcohol, or use illegal drugs. Some items may interact with your medicine. What should I watch for while using this medicine? Visit your doctor or health care professional for regular checks on your progress. Your doctor or health care professional may order blood tests and other tests to see how you are doing. Call your doctor or health care professional for advice if you get a fever, chills or sore throat, or other symptoms of a cold or flu. Do not treat yourself. This drug may decrease your body's ability to fight infection. Try to avoid being around people who are sick. You should make sure you get enough calcium and vitamin D while you are taking this medicine, unless your doctor tells you not to. Discuss the foods you eat and the vitamins you take with your health care professional. See your dentist regularly. Brush and floss your teeth as directed. Before you have any dental work done, tell your dentist you are receiving this medicine. Do not become pregnant while taking this medicine or for 5 months   after stopping it. Talk with your doctor or health care professional about your birth control options while taking this medicine. Women should inform their doctor if they wish to become pregnant or think they might be pregnant. There is a potential for serious side effects to an unborn  child. Talk to your health care professional or pharmacist for more information. What side effects may I notice from receiving this medicine? Side effects that you should report to your doctor or health care professional as soon as possible: -allergic reactions like skin rash, itching or hives, swelling of the face, lips, or tongue -bone pain -breathing problems -dizziness -jaw pain, especially after dental work -redness, blistering, peeling of the skin -signs and symptoms of infection like fever or chills; cough; sore throat; pain or trouble passing urine -signs of low calcium like fast heartbeat, muscle cramps or muscle pain; pain, tingling, numbness in the hands or feet; seizures -unusual bleeding or bruising -unusually weak or tired Side effects that usually do not require medical attention (report to your doctor or health care professional if they continue or are bothersome): -constipation -diarrhea -headache -joint pain -loss of appetite -muscle pain -runny nose -tiredness -upset stomach This list may not describe all possible side effects. Call your doctor for medical advice about side effects. You may report side effects to FDA at 1-800-FDA-1088. Where should I keep my medicine? This medicine is only given in a clinic, doctor's office, or other health care setting and will not be stored at home. NOTE: This sheet is a summary. It may not cover all possible information. If you have questions about this medicine, talk to your doctor, pharmacist, or health care provider.  2019 Elsevier/Gold Standard (2017-11-27 16:10:44)  Leuprolide injection What is this medicine? LEUPROLIDE (loo PROE lide) is a man-made hormone. It is used to treat the symptoms of prostate cancer. This medicine may also be used to treat children with early onset of puberty. It may be used for other hormonal conditions. This medicine may be used for other purposes; ask your health care provider or pharmacist if  you have questions. COMMON BRAND NAME(S): Lupron What should I tell my health care provider before I take this medicine? They need to know if you have any of these conditions: -diabetes -heart disease or previous heart attack -high blood pressure -high cholesterol -pain or difficulty passing urine -spinal cord metastasis -stroke -tobacco smoker -an unusual or allergic reaction to leuprolide, benzyl alcohol, other medicines, foods, dyes, or preservatives -pregnant or trying to get pregnant -breast-feeding How should I use this medicine? This medicine is for injection under the skin or into a muscle. You will be taught how to prepare and give this medicine. Use exactly as directed. Take your medicine at regular intervals. Do not take your medicine more often than directed. It is important that you put your used needles and syringes in a special sharps container. Do not put them in a trash can. If you do not have a sharps container, call your pharmacist or healthcare provider to get one. A special MedGuide will be given to you by the pharmacist with each prescription and refill. Be sure to read this information carefully each time. Talk to your pediatrician regarding the use of this medicine in children. While this medicine may be prescribed for children as young as 8 years for selected conditions, precautions do apply. Overdosage: If you think you have taken too much of this medicine contact a poison control center or emergency room at   NOTE: This medicine is only for you. Do not share this medicine with others. What if I miss a dose? If you miss a dose, take it as soon as you can. If it is almost time for your next dose, take only that dose. Do not take double or extra doses. What may interact with this medicine? Do not take this medicine with any of the following medications: -chasteberry This medicine may also interact with the following medications: -herbal or dietary supplements,  like black cohosh or DHEA -male hormones, like estrogens or progestins and birth control pills, patches, rings, or injections -male hormones, like testosterone This list may not describe all possible interactions. Give your health care provider a list of all the medicines, herbs, non-prescription drugs, or dietary supplements you use. Also tell them if you smoke, drink alcohol, or use illegal drugs. Some items may interact with your medicine. What should I watch for while using this medicine? Visit your doctor or health care professional for regular checks on your progress. During the first week, your symptoms may get worse, but then will improve as you continue your treatment. You may get hot flashes, increased bone pain, increased difficulty passing urine, or an aggravation of nerve symptoms. Discuss these effects with your doctor or health care professional, some of them may improve with continued use of this medicine. Male patients may experience a menstrual cycle or spotting during the first 2 months of therapy with this medicine. If this continues, contact your doctor or health care professional. What side effects may I notice from receiving this medicine? Side effects that you should report to your doctor or health care professional as soon as possible: -allergic reactions like skin rash, itching or hives, swelling of the face, lips, or tongue -breathing problems -chest pain -depression or memory disorders -pain in your legs or groin -pain at site where injected -severe headache -swelling of the feet and legs -visual changes -vomiting Side effects that usually do not require medical attention (report to your doctor or health care professional if they continue or are bothersome): -breast swelling or tenderness -decrease in sex drive or performance -diarrhea -hot flashes -loss of appetite -muscle, joint, or bone pains -nausea -redness or irritation at site where injected -skin  problems or acne This list may not describe all possible side effects. Call your doctor for medical advice about side effects. You may report side effects to FDA at 1-800-FDA-1088. Where should I keep my medicine? Keep out of the reach of children. Store below 25 degrees C (77 degrees F). Do not freeze. Protect from light. Do not use if it is not clear or if there are particles present. Throw away any unused medicine after the expiration date. NOTE: This sheet is a summary. It may not cover all possible information. If you have questions about this medicine, talk to your doctor, pharmacist, or health care provider.  2019 Elsevier/Gold Standard (2016-03-10 10:54:35)

## 2018-12-23 ENCOUNTER — Ambulatory Visit (HOSPITAL_COMMUNITY)
Admission: RE | Admit: 2018-12-23 | Discharge: 2018-12-23 | Disposition: A | Discharge status: Home / Self Care | Payer: BLUE CROSS/BLUE SHIELD | Source: Ambulatory Visit | Attending: Oncology | Admitting: Oncology

## 2018-12-23 ENCOUNTER — Telehealth: Payer: Self-pay

## 2018-12-23 DIAGNOSIS — C7951 Secondary malignant neoplasm of bone: Secondary | ICD-10-CM | POA: Insufficient documentation

## 2018-12-23 DIAGNOSIS — C61 Malignant neoplasm of prostate: Secondary | ICD-10-CM | POA: Diagnosis present

## 2018-12-23 LAB — TESTOSTERONE: Testosterone: 5 ng/dL — ABNORMAL LOW (ref 264–916)

## 2018-12-23 MED ORDER — TECHNETIUM TC 99M MEDRONATE IV KIT
19.5000 | PACK | Freq: Once | INTRAVENOUS | Status: AC | PRN
  Administered 2018-12-23: 19.5 via INTRAVENOUS

## 2018-12-23 NOTE — Telephone Encounter (Signed)
We can try atenolol 50 mg BID. Let patient know if it is okay. He does answer on mychart

## 2018-12-23 NOTE — Telephone Encounter (Signed)
Please do

## 2018-12-23 NOTE — Telephone Encounter (Signed)
Yes you can go ahead and send that in

## 2018-12-23 NOTE — Telephone Encounter (Signed)
Pt insurance is not going to approve bystolic can you try one of these alternatives ; Atenolol Bisoprolol Fumarate Carvedilol Metoprolol Tartrate Metoprolol Succinate ER  Please give mg and directions for use

## 2018-12-24 ENCOUNTER — Encounter: Payer: Self-pay | Admitting: Cardiology

## 2018-12-24 LAB — PROSTATE-SPECIFIC AG, SERUM (LABCORP): Prostate Specific Ag, Serum: 359 ng/mL — ABNORMAL HIGH (ref 0.0–4.0)

## 2018-12-28 ENCOUNTER — Inpatient Hospital Stay (HOSPITAL_BASED_OUTPATIENT_CLINIC_OR_DEPARTMENT_OTHER): Payer: BLUE CROSS/BLUE SHIELD | Admitting: Oncology

## 2018-12-28 ENCOUNTER — Other Ambulatory Visit: Payer: Self-pay

## 2018-12-28 VITALS — BP 130/80 | HR 58 | Temp 98.5°F | Resp 18 | Ht 70.0 in | Wt 228.3 lb

## 2018-12-28 DIAGNOSIS — I700 Atherosclerosis of aorta: Secondary | ICD-10-CM

## 2018-12-28 DIAGNOSIS — Z192 Hormone resistant malignancy status: Secondary | ICD-10-CM

## 2018-12-28 DIAGNOSIS — Z923 Personal history of irradiation: Secondary | ICD-10-CM

## 2018-12-28 DIAGNOSIS — Z9221 Personal history of antineoplastic chemotherapy: Secondary | ICD-10-CM

## 2018-12-28 DIAGNOSIS — Z79818 Long term (current) use of other agents affecting estrogen receptors and estrogen levels: Secondary | ICD-10-CM

## 2018-12-28 DIAGNOSIS — R42 Dizziness and giddiness: Secondary | ICD-10-CM

## 2018-12-28 DIAGNOSIS — J439 Emphysema, unspecified: Secondary | ICD-10-CM

## 2018-12-28 DIAGNOSIS — Z7982 Long term (current) use of aspirin: Secondary | ICD-10-CM

## 2018-12-28 DIAGNOSIS — C61 Malignant neoplasm of prostate: Secondary | ICD-10-CM

## 2018-12-28 DIAGNOSIS — Z7951 Long term (current) use of inhaled steroids: Secondary | ICD-10-CM | POA: Diagnosis not present

## 2018-12-28 DIAGNOSIS — Z79899 Other long term (current) drug therapy: Secondary | ICD-10-CM

## 2018-12-28 NOTE — Progress Notes (Signed)
Hematology and Oncology Follow Up Visit  Alejandro [name] [id number] Apr 18, 1956 63 y.o. 12/28/2018 3:25 PM [name], MDRusso, [name], MD   Principle Diagnosis: 63 year old man with castration-resistant prostate cancer diagnosed in 2017.  He was initially diagnosed in 2015 with a PSA 6.4 and a Gleason score of 8.      Prior Therapy:   Status post biopsy done on July of 2015 followed by lymph node dissection completed in 2015 at [facility].  He was treated there with definitive radiation therapy utilizing seed implant as well as external beam radiation completed in 2016.  He developed advanced disease with bone involvement.  He received radiation therapy to the pelvis in 2017.  Xtandi 160 mg daily that was poorly tolerated and was discontinued.  He was switched to [name] which she took until 2019 and developed progression of disease with bony metastasis as well as PSA of 80.  Taxotere chemotherapy at 75 mg/m cycle 1 given in March 2019.  He is status post 6 cycles of chemotherapy last cycle given on February 03, 2018.  Current therapy:   Lupron 30 mg every 4 months.   Xgeva 120 mg every 4 weeks to start on March 29, 2018.   He is currently receiving treatment utilizing Darolutamide as well as LuPSAM a part of a clinical trial at [facility] in [place].  He is status post 4 cycles of therapy.  Last cycle given on October 01, 2018.  Interim History:  Alejandro [name] presents today for a follow-up visit.  He is currently receiving experimental study as outlined above although he has received only 4 cycles of treatment and therapy has been on hold because of his inability to travel to [place] related to COVID-19 pandemic.  He has tolerated therapy reasonably well with PSA has been fluctuating.  His PSA on February 14 was 85 is dropped from 117 in January 2020.  His PSA in October 2019 was 1255.  Clinically he does report occasional dizziness and vertigo.   He has been diagnosed with an ear infection and completed a course of antibiotics.  He took meclizine today which helped his symptoms.   He denies any headaches, blurry vision, syncope or seizures.  He denies any dizziness. He does not report any chills or sweats.  He denies any chest pain, palpitation, orthopnea or leg edema.  He does not report any cough, wheezing or hemoptysis.  Does not report any nausea vomiting or abdominal pain. Does not report any changes in his bowel habits.  He did not report any bone pain or pathological fractures.  He does not report any lymphadenopathy or petechiae. Does not report any skin rashes or lesions.  Denies any mood changes.  He denies any heat or cold intolerance..  Remaining review of system is negative.    Medications: I have reviewed the patient's current medications.  Current Outpatient Medications  Medication Sig Dispense Refill  . Alirocumab (PRALUENT [place]) Inject 75 mg into the skin every 14 (fourteen) days.     Marland Kitchen amitriptyline (ELAVIL) 10 MG tablet daily.    Marland Kitchen amLODipine (NORVASC) 5 MG tablet TAKE 1 TABLET BY MOUTH DAILY 90 tablet 0  . aspirin 325 MG EC tablet Take 325 mg by mouth daily.    Marland Kitchen BYSTOLIC 10 MG tablet TAKE 1 TABLET BY MOUTH AT BEDTIME 90 tablet 0  . calcium-vitamin D (OSCAL WITH D) 500-200 MG-UNIT tablet Take 1 tablet by mouth 2 (two) times daily. 90 tablet 3  .  cholecalciferol (VITAMIN D3) 25 MCG (1000 UT) tablet Take 1,000 Units by mouth daily.    Marland Kitchen ezetimibe (ZETIA) 10 MG tablet Take 10 mg by mouth daily.    . fluticasone (FLONASE) 50 MCG/ACT nasal spray Place 1-2 sprays into both nostrils daily.    Marland Kitchen loratadine (CLARITIN) 10 MG tablet Take 10 mg by mouth daily.    Marland Kitchen losartan (COZAAR) 50 MG tablet Take 50 mg by mouth daily.     . NUBEQA 300 MG TABS 2 tabs bid    . tamsulosin (FLOMAX) 0.4 MG CAPS capsule TAKE ONE CAPSULE BY MOUTH TWICE DAILY 60 capsule 0   No current facility-administered medications for this visit.       Allergies:  Allergies  Allergen Reactions  . Codeine Nausea Only    Past Medical History, Surgical history, Social history, and Family History were reviewed and updated.   Physical Exam: Blood pressure 130/80, pulse (!) 58, temperature 98.5 F (36.9 C), temperature source Oral, resp. rate 18, height 5\' 10"  (1.778 m), weight 228 lb 4.8 oz (103.6 kg), SpO2 100 %.   ECOG: 0   General appearance: Alert, awake without any distress. Head: Atraumatic without abnormalities Oropharynx: Without any thrush or ulcers. Eyes: No scleral icterus. Lymph nodes: No lymphadenopathy noted in the cervical, supraclavicular, or axillary nodes Heart:regular rate and rhythm, without any murmurs or gallops.   Lung: Clear to auscultation without any rhonchi, wheezes or dullness to percussion. Abdomin: Soft, nontender without any shifting dullness or ascites. Musculoskeletal: No clubbing or cyanosis. Neurological: No motor or sensory deficits. Skin: No rashes or lesions. Psychiatric: Mood and affect appeared normal.       Lab Results: Lab Results  Component Value Date   WBC 5.6 12/22/2018   HGB 11.8 (L) 12/22/2018   HCT 35.6 (L) 12/22/2018   MCV 93.0 12/22/2018   PLT 283 12/22/2018     Chemistry      Component Value Date/Time   NA 136 12/22/2018 1317   NA 135 (L) 06/13/2014 0944   K 3.9 12/22/2018 1317   K 5.4 (H) 06/13/2014 0944   CL 100 12/22/2018 1317   CO2 24 12/22/2018 1317   CO2 25 06/13/2014 0944   BUN 14 12/22/2018 1317   BUN 18.8 06/13/2014 0944   CREATININE 0.93 12/22/2018 1317   CREATININE 1.0 06/13/2014 0944      Component Value Date/Time   CALCIUM 9.3 12/22/2018 1317   CALCIUM 10.0 06/13/2014 0944   ALKPHOS 80 12/22/2018 1317   ALKPHOS 96 06/13/2014 0944   AST 30 12/22/2018 1317   AST 104 (H) 06/13/2014 0944   ALT 23 12/22/2018 1317   ALT 126 (H) 06/13/2014 0944   BILITOT 0.4 12/22/2018 1317   BILITOT 0.63 06/13/2014 0944       Results for Alejandro [name], Alejandro [name] (MRN [id number]) as of 12/28/2018 14:57  Ref. Range 03/29/2018 09:29 12/22/2018 13:17  Prostate Specific Ag, Serum Latest Ref Range: 0.0 - 4.0 ng/mL 83.9 (H) 359.0 (H)   IMPRESSION: 1. Lymphadenopathy in the retrocrural spaces and abdomen, consistent with metastatic disease. Mild lymphadenopathy in the right groin is also concerning. 2. Widespread sclerotic bone metastases. 3. 10 mm ground-glass nodule posterior left lower lobe. Initial follow-up with CT at 6-12 months is recommended to confirm persistence. If persistent, repeat CT is recommended every 2 years until 5 years of stability has been established. This recommendation follows the consensus statement: Guidelines for Management of Incidental Pulmonary Nodules Detected on CT Images: From the Fleischner  Society 2017; Radiology 2017; [phone].  Aortic Atherosclerosis (ICD10-I70.0). Emphysema. (IBB04-U88.9)  FINDINGS: Extensive areas of abnormal bony uptake noted throughout the calvarium, manubrium, bilateral ribs, thoracic and lumbar vertebral bodies and iliac bones, left greater than right compatible with osseous metastatic disease. Soft tissue activity unremarkable.  IMPRESSION: Evidence of extensive osseous metastatic disease as described above.  Impression and Plan:  63 year old man with the:  1.advanced prostate cancer that is currently castration-resistant with lymphadenopathy and bone disease.  He is currently receiving experimental protocol as outlined above and completed 4 cycles of therapy with stabilization of his PSA.  He has not been able to receive therapy because of limited travel but he is planning to resume treatment in the near future.  His PSA as well as imaging studies were personally reviewed and discussed with the patient.  Is have a rise in the PSA although the degree of progression of disease is unclear without comparing it to previous imaging studies.  Options of therapy were  reviewed which include resuming experimental protocol if he still qualifies for this resumption versus treating it was different salvage therapy such as systemic chemotherapy.  At this time he is planning to travel to [place] for evaluation and prefers to stick with experimental protocol if possible.   2.  Bone directed therapy: He continues to be on Xgeva which she has been receiving on a monthly basis.  Last treatment was given on Dec 22, 2018.  3.  Androgen depravation: Currently on Lupron 30 mg every 4 months.  Last injection given on Dec 30, 2018.  4. Follow-up: Will be as needed in the future depending on his decision to receive subsequent salvage therapy locally.  25 minutes was spent with the patient face-to-face today.  More than 50% of time was spent on reviewing his disease status, imaging studies as well as different salvage options.       Zola Button, MD 5/26/20203:25 PM

## 2019-01-03 ENCOUNTER — Telehealth: Payer: Self-pay

## 2019-01-03 ENCOUNTER — Other Ambulatory Visit: Payer: Self-pay | Admitting: Oncology

## 2019-01-03 DIAGNOSIS — C61 Malignant neoplasm of prostate: Secondary | ICD-10-CM

## 2019-01-03 DIAGNOSIS — C7951 Secondary malignant neoplasm of bone: Secondary | ICD-10-CM

## 2019-01-03 NOTE — Telephone Encounter (Signed)
I cancelled the stress, but was unable to cancel echo, can you please take care of it

## 2019-01-03 NOTE — Telephone Encounter (Signed)
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

## 2019-01-03 NOTE — Telephone Encounter (Signed)
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  He will call me back once he is more stable to see me in the office.

## 2019-01-05 ENCOUNTER — Other Ambulatory Visit: Payer: BLUE CROSS/BLUE SHIELD

## 2019-01-11 ENCOUNTER — Telehealth: Payer: Self-pay | Admitting: Oncology

## 2019-01-11 ENCOUNTER — Other Ambulatory Visit: Payer: Self-pay | Admitting: Oncology

## 2019-01-11 NOTE — Telephone Encounter (Signed)
Scheduled appt per sch msg. Called and spoke with patient. Confirmed date and time °

## 2019-01-13 ENCOUNTER — Observation Stay (HOSPITAL_COMMUNITY)
Admission: EM | Admit: 2019-01-13 | Discharge: 2019-01-14 | Disposition: A | Discharge status: Home / Self Care | Payer: BC Managed Care – PPO | Attending: Cardiology | Admitting: Cardiology

## 2019-01-13 ENCOUNTER — Encounter (HOSPITAL_COMMUNITY): Payer: Self-pay | Admitting: Emergency Medicine

## 2019-01-13 ENCOUNTER — Other Ambulatory Visit: Payer: Self-pay

## 2019-01-13 DIAGNOSIS — Z79899 Other long term (current) drug therapy: Secondary | ICD-10-CM | POA: Diagnosis not present

## 2019-01-13 DIAGNOSIS — Z923 Personal history of irradiation: Secondary | ICD-10-CM | POA: Diagnosis not present

## 2019-01-13 DIAGNOSIS — G4700 Insomnia, unspecified: Secondary | ICD-10-CM | POA: Diagnosis not present

## 2019-01-13 DIAGNOSIS — Z6832 Body mass index (BMI) 32.0-32.9, adult: Secondary | ICD-10-CM | POA: Diagnosis not present

## 2019-01-13 DIAGNOSIS — G4733 Obstructive sleep apnea (adult) (pediatric): Secondary | ICD-10-CM | POA: Insufficient documentation

## 2019-01-13 DIAGNOSIS — Z87891 Personal history of nicotine dependence: Secondary | ICD-10-CM | POA: Insufficient documentation

## 2019-01-13 DIAGNOSIS — E785 Hyperlipidemia, unspecified: Secondary | ICD-10-CM | POA: Diagnosis not present

## 2019-01-13 DIAGNOSIS — I4891 Unspecified atrial fibrillation: Secondary | ICD-10-CM | POA: Diagnosis not present

## 2019-01-13 DIAGNOSIS — F419 Anxiety disorder, unspecified: Secondary | ICD-10-CM | POA: Diagnosis not present

## 2019-01-13 DIAGNOSIS — I208 Other forms of angina pectoris: Secondary | ICD-10-CM | POA: Diagnosis not present

## 2019-01-13 DIAGNOSIS — E669 Obesity, unspecified: Secondary | ICD-10-CM | POA: Insufficient documentation

## 2019-01-13 DIAGNOSIS — I10 Essential (primary) hypertension: Secondary | ICD-10-CM | POA: Diagnosis not present

## 2019-01-13 DIAGNOSIS — C7951 Secondary malignant neoplasm of bone: Secondary | ICD-10-CM | POA: Insufficient documentation

## 2019-01-13 DIAGNOSIS — Z7982 Long term (current) use of aspirin: Secondary | ICD-10-CM | POA: Diagnosis not present

## 2019-01-13 DIAGNOSIS — Z9119 Patient's noncompliance with other medical treatment and regimen: Secondary | ICD-10-CM | POA: Insufficient documentation

## 2019-01-13 DIAGNOSIS — R079 Chest pain, unspecified: Secondary | ICD-10-CM

## 2019-01-13 DIAGNOSIS — E7800 Pure hypercholesterolemia, unspecified: Secondary | ICD-10-CM | POA: Diagnosis not present

## 2019-01-13 DIAGNOSIS — Z66 Do not resuscitate: Secondary | ICD-10-CM | POA: Diagnosis not present

## 2019-01-13 DIAGNOSIS — Z885 Allergy status to narcotic agent status: Secondary | ICD-10-CM | POA: Insufficient documentation

## 2019-01-13 DIAGNOSIS — I25118 Atherosclerotic heart disease of native coronary artery with other forms of angina pectoris: Secondary | ICD-10-CM | POA: Insufficient documentation

## 2019-01-13 DIAGNOSIS — Z8042 Family history of malignant neoplasm of prostate: Secondary | ICD-10-CM | POA: Insufficient documentation

## 2019-01-13 DIAGNOSIS — Z1159 Encounter for screening for other viral diseases: Secondary | ICD-10-CM | POA: Insufficient documentation

## 2019-01-13 DIAGNOSIS — I21A1 Myocardial infarction type 2: Secondary | ICD-10-CM | POA: Diagnosis not present

## 2019-01-13 DIAGNOSIS — Z7951 Long term (current) use of inhaled steroids: Secondary | ICD-10-CM | POA: Insufficient documentation

## 2019-01-13 DIAGNOSIS — I700 Atherosclerosis of aorta: Secondary | ICD-10-CM | POA: Diagnosis not present

## 2019-01-13 DIAGNOSIS — C61 Malignant neoplasm of prostate: Secondary | ICD-10-CM

## 2019-01-13 DIAGNOSIS — H539 Unspecified visual disturbance: Secondary | ICD-10-CM | POA: Diagnosis not present

## 2019-01-13 LAB — CBC
HCT: 47.6 % (ref 39.0–52.0)
HCT: 40.8 % (ref 39.0–52.0)
Hemoglobin: 15.1 g/dL (ref 13.0–17.0)
Hemoglobin: 13.4 g/dL (ref 13.0–17.0)
MCH: 30.6 pg (ref 26.0–34.0)
MCH: 31.6 pg (ref 26.0–34.0)
MCHC: 31.7 g/dL (ref 30.0–36.0)
MCHC: 32.8 g/dL (ref 30.0–36.0)
MCV: 96.4 fL (ref 80.0–100.0)
MCV: 96.2 fL (ref 80.0–100.0)
Platelets: 256 10*3/uL (ref 150–400)
Platelets: 216 10*3/uL (ref 150–400)
RBC: 4.94 MIL/uL (ref 4.22–5.81)
RBC: 4.24 MIL/uL (ref 4.22–5.81)
RDW: 14.1 % (ref 11.5–15.5)
RDW: 14.3 % (ref 11.5–15.5)
WBC: 10 10*3/uL (ref 4.0–10.5)
WBC: 7.9 10*3/uL (ref 4.0–10.5)
nRBC: 0 % (ref 0.0–0.2)
nRBC: 0 % (ref 0.0–0.2)

## 2019-01-13 LAB — BASIC METABOLIC PANEL
Anion gap: 14 (ref 5–15)
BUN: 34 mg/dL — ABNORMAL HIGH (ref 8–23)
CO2: 20 mmol/L — ABNORMAL LOW (ref 22–32)
Calcium: 8.6 mg/dL — ABNORMAL LOW (ref 8.9–10.3)
Chloride: 98 mmol/L (ref 98–111)
Creatinine, Ser: 0.88 mg/dL (ref 0.61–1.24)
GFR calc Af Amer: >60 mL/min (ref >60)
GFR calc non Af Amer: >60 mL/min (ref >60)
Glucose, Bld: 184 mg/dL — ABNORMAL HIGH (ref 70–99)
Potassium: 4.4 mmol/L (ref 3.5–5.1)
Sodium: 132 mmol/L — ABNORMAL LOW (ref 135–145)

## 2019-01-13 LAB — CREATININE, SERUM
Creatinine, Ser: 0.91 mg/dL (ref 0.61–1.24)
GFR calc Af Amer: >60 mL/min (ref >60)
GFR calc non Af Amer: >60 mL/min (ref >60)

## 2019-01-13 LAB — MAGNESIUM: Magnesium: 3.1 mg/dL — ABNORMAL HIGH (ref 1.7–2.4)

## 2019-01-13 LAB — TROPONIN I: Troponin I: 0.07 ng/mL (ref <0.03)

## 2019-01-13 LAB — SARS CORONAVIRUS 2 BY RT PCR (HOSPITAL ORDER, PERFORMED IN ~~LOC~~ HOSPITAL LAB): SARS Coronavirus 2: NEGATIVE

## 2019-01-13 LAB — TSH: TSH: 0.55 u[IU]/mL (ref 0.350–4.500)

## 2019-01-13 MED ORDER — ONDANSETRON HCL 4 MG/2ML IJ SOLN: 4.0000 mg | Freq: Four times a day (QID) | INTRAMUSCULAR | Status: DC | PRN

## 2019-01-13 MED ORDER — DILTIAZEM HCL 100 MG IV SOLR
5.0000 mg/h | INTRAVENOUS | Status: DC
  Administered 2019-01-13 – 2019-01-14 (×2): 5 mg/h via INTRAVENOUS
  Filled 2019-01-13: qty 100

## 2019-01-13 MED ORDER — SODIUM CHLORIDE 0.9% FLUSH: 3.0000 mL | Freq: Once | INTRAVENOUS | Status: DC

## 2019-01-13 MED ORDER — DILTIAZEM LOAD VIA INFUSION
15.0000 mg | Freq: Once | INTRAVENOUS | Status: AC
  Administered 2019-01-13: 15 mg via INTRAVENOUS
  Filled 2019-01-13: qty 15

## 2019-01-13 MED ORDER — ACETAMINOPHEN 325 MG PO TABS: 650.0000 mg | ORAL_TABLET | ORAL | Status: DC | PRN

## 2019-01-13 MED ORDER — SOTALOL HCL 80 MG PO TABS
80.0000 mg | ORAL_TABLET | Freq: Two times a day (BID) | ORAL | Status: DC
  Administered 2019-01-14 (×2): 80 mg via ORAL
  Filled 2019-01-13 (×2): qty 1

## 2019-01-13 MED ORDER — MORPHINE SULFATE ER 15 MG PO TBCR
15.0000 mg | EXTENDED_RELEASE_TABLET | Freq: Two times a day (BID) | ORAL | Status: DC | PRN
  Administered 2019-01-14: 15 mg via ORAL
  Filled 2019-01-13: qty 1

## 2019-01-13 MED ORDER — MORPHINE SULFATE (PF) 4 MG/ML IV SOLN
4.0000 mg | Freq: Once | INTRAVENOUS | Status: AC
  Administered 2019-01-13: 4 mg via INTRAVENOUS
  Filled 2019-01-13: qty 1

## 2019-01-13 MED ORDER — LOSARTAN POTASSIUM 50 MG PO TABS
50.0000 mg | ORAL_TABLET | Freq: Every day | ORAL | Status: DC
  Administered 2019-01-14: 09:00:00 50 mg via ORAL
  Filled 2019-01-13: qty 1

## 2019-01-13 MED ORDER — DEXAMETHASONE 2 MG PO TABS
2.0000 mg | ORAL_TABLET | Freq: Two times a day (BID) | ORAL | Status: DC
  Administered 2019-01-13 – 2019-01-14 (×2): 2 mg via ORAL
  Filled 2019-01-13 (×2): qty 1

## 2019-01-13 MED ORDER — AMITRIPTYLINE HCL 10 MG PO TABS
20.0000 mg | ORAL_TABLET | Freq: Every day | ORAL | Status: DC
  Administered 2019-01-14: 20 mg via ORAL
  Filled 2019-01-13: qty 2

## 2019-01-13 MED ORDER — TAMSULOSIN HCL 0.4 MG PO CAPS
0.4000 mg | ORAL_CAPSULE | Freq: Two times a day (BID) | ORAL | Status: DC
  Administered 2019-01-14 (×2): 0.4 mg via ORAL
  Filled 2019-01-13 (×2): qty 1

## 2019-01-13 MED ORDER — HEPARIN SODIUM (PORCINE) 5000 UNIT/ML IJ SOLN: 5000.0000 [IU] | Freq: Three times a day (TID) | INTRAMUSCULAR | Status: DC

## 2019-01-13 MED ORDER — DILTIAZEM HCL 100 MG IV SOLR
5.0000 mg/h | INTRAVENOUS | Status: DC
  Administered 2019-01-13: 20:00:00 5 mg/h via INTRAVENOUS
  Filled 2019-01-13 (×2): qty 100

## 2019-01-13 MED ORDER — ETOMIDATE 2 MG/ML IV SOLN
0.1500 mg/kg | Freq: Once | INTRAVENOUS | Status: DC
  Filled 2019-01-13: qty 10

## 2019-01-13 NOTE — H&P (Signed)
Primary Physician/Referring:  [name], MD  Patient ID: Alejandro Ready., male    DOB: [birth year], 63 y.o.   MRN: [id number]  Chief Complaint  Patient presents with   Chest Pain    HPI: Alejandro Annie Roseboom.  is a 63 y.o. male  with  coronary atherosclerosis, coronary angiography in 10/13/2007 had revealed occluded RCA. He had mild disease diffusely in the other vessels. as well as multiple comorbidity including dyslipidemia, hypertension, OSA on CPAP and moderate obesity as well as an abdominal aortic atherosclerosis with severe calcification. He also has chronic orthostatic hypotension and supine hypertension.   Patient with fairly aggressive prostate cancer, he is being treated at [facility] in [place] and locally follows Dr. Kathie Rhodes, has metastatic prostate cancer. He has history of radiation therapy to his right hip.Unfortunately, his prostate cancer has continued to progress and has recently started with IV chemotherapy.  He now has metastasis to his skull, was also recently told to have intracranial bleeding and is presently on dexamethasone for the same.  This afternoon he started feeling extremely fatigued and tired, had chest pain and discomfort and also mild palpitations and dyspnea.  He thought that he was having similar episode that he had initially when he presented with non-STEMI and presented to [facility] where he was found to be in atrial fibrillation with rapid ventricular response.  He was started on IV diltiazem, I am admitting him for further evaluation.  He has started to feel better, with heart rate control.  He is not having any active chest pain presently.  He has no developed visual disturbance due to the tumor in his skull.  Bystolic was discontinued while he was in [place] due to bradycardia.  He was also told to consume excessive amount of salt as he was found to be hyponatremic while undergoing  chemotherapy and radiation therapy.  Past Medical History:  Diagnosis Date   ANXIETY    AORTIC STENOSIS, MILD    CHEST PAIN-UNSPECIFIED    DYSPNEA    HYPERCHOLESTEROLEMIA    Hypertension    INSOMNIA    OSA on CPAP 05/31/2013   OSTEOARTHRITIS, KNEE    Prostate cancer (HCC)     Past Surgical History:  Procedure Laterality Date   COLONOSCOPY     ELBOW SURGERY     LUMBAR LAMINECTOMY  Late 1990's   NEUROPLASTY / TRANSPOSITION MEDIAN NERVE AT CARPAL TUNNEL BILATERAL     PROSTATE BIOPSY      Social History   Socioeconomic History   Marital status: Divorced    Spouse name: Not on file   Number of children: 1   Years of education: college   Highest education level: Not on file  Occupational History   Occupation: CONSULTANT    Employer: [employer]  Social Designer, fashion/clothing strain: Not on file   Food insecurity    Worry: Not on file    Inability: Not on file   Transportation needs    Medical: Not on file    Non-medical: Not on file  Tobacco Use   Smoking status: Former Smoker    Packs/day: 3.00    Years: 25.00    Pack years: 75.00    Types: Cigars, Cigarettes    Quit date: 12/03/1998    Years since quitting: 20.1   Smokeless tobacco: Never Used   Tobacco comment: Former 3 ppd smoker for 30 years quit heavy smoking 15 years ago  Substance and Sexual Activity   Alcohol use: Yes    Alcohol/week: 12.0 standard drinks    Types: 12 Standard drinks or equivalent per week    Comment: 2 drinks per day   Drug use: No   Sexual activity: Yes  Lifestyle   Physical activity    Days per week: Not on file    Minutes per session: Not on file   Stress: Not on file  Relationships   Social connections    Talks on phone: Not on file    Gets together: Not on file    Attends religious service: Not on file    Active member of club or organization: Not on file    Attends meetings of clubs or organizations: Not on file    Relationship  status: Not on file   Intimate partner violence    Fear of current or ex partner: Not on file    Emotionally abused: Not on file    Physically abused: Not on file    Forced sexual activity: Not on file  Other Topics Concern   Not on file  Social History Narrative   Not on file    No current facility-administered medications on file prior to encounter.    Current Outpatient Medications on File Prior to Encounter  Medication Sig Dispense Refill   amitriptyline (ELAVIL) 10 MG tablet daily.     amLODipine (NORVASC) 5 MG tablet TAKE 1 TABLET BY MOUTH DAILY 90 tablet 0   BYSTOLIC 10 MG tablet TAKE 1 TABLET BY MOUTH AT BEDTIME 90 tablet 0   calcium-vitamin D (OSCAL WITH D) 500-200 MG-UNIT tablet Take 1 tablet by mouth 2 (two) times daily. 90 tablet 3   cholecalciferol (VITAMIN D3) 25 MCG (1000 UT) tablet Take 1,000 Units by mouth daily.     ezetimibe (ZETIA) 10 MG tablet Take 10 mg by mouth daily.     fluticasone (FLONASE) 50 MCG/ACT nasal spray Place 1-2 sprays into both nostrils daily.     loratadine (CLARITIN) 10 MG tablet Take 10 mg by mouth daily.     losartan (COZAAR) 50 MG tablet Take 50 mg by mouth daily.      tamsulosin (FLOMAX) 0.4 MG CAPS capsule TAKE 1 CAPSULE BY MOUTH TWICE DAILY 60 capsule 0    Review of Systems  Constitution: Positive for malaise/fatigue and weight loss (50 Lbs and intentional). Negative for chills, decreased appetite and weight gain.  Eyes: Positive for blurred vision.  Cardiovascular: Positive for chest pain, dyspnea on exertion (worsening) and palpitations. Negative for leg swelling and syncope.  Endocrine: Negative for cold intolerance.  Hematologic/Lymphatic: Does not bruise/bleed easily.  Musculoskeletal: Negative for joint swelling.  Gastrointestinal: Negative for abdominal pain, anorexia and change in bowel habit.  Genitourinary: Positive for decreased libido and frequency.  Neurological: Positive for dizziness. Negative for headaches  and light-headedness.  Psychiatric/Behavioral: Negative for depression and substance abuse.  All other systems reviewed and are negative.     Objective  Blood pressure (!) 130/95, pulse (!) 104, resp. rate 18, height 5\' 10"  (1.778 m), weight 103.4 kg, SpO2 98 %. Body mass index is 32.71 kg/m.    Physical Exam  Constitutional: He appears well-developed. No distress.  Moderately obese  HENT:  Head: Atraumatic.  Eyes: Conjunctivae are normal.  Neck: Neck supple. No JVD present. No thyromegaly present.  Cardiovascular: Intact distal pulses. An irregular rhythm present. Tachycardia present. Exam reveals no gallop.  No murmur heard. S1 variable and S2 normal.  Pulmonary/Chest:  Effort normal and breath sounds normal.  Abdominal: Soft. Bowel sounds are normal.  Musculoskeletal: Normal range of motion.        General: No edema.  Neurological: He is alert.  Skin: Skin is warm and dry.  Psychiatric: He has a normal mood and affect.   Radiology: No results found.  Laboratory examination:    CMP Latest Ref Rng & Units 01/13/2019 12/22/2018 03/29/2018  Glucose 70 - 99 mg/dL 184(H) 107(H) 156(H)  BUN 8 - 23 mg/dL 34(H) 14 8  Creatinine 0.61 - 1.24 mg/dL 0.88 0.93 1.10  Sodium 135 - 145 mmol/L 132(L) 136 137  Potassium 3.5 - 5.1 mmol/L 4.4 3.9 4.1  Chloride 98 - 111 mmol/L 98 100 101  CO2 22 - 32 mmol/L 20(L) 24 24  Calcium 8.9 - 10.3 mg/dL 8.6(L) 9.3 9.9  Total Protein 6.5 - 8.1 g/dL - 7.4 7.5  Total Bilirubin 0.3 - 1.2 mg/dL - 0.4 0.5  Alkaline Phos 38 - 126 U/L - 80 110  AST 15 - 41 U/L - 30 18  ALT 0 - 44 U/L - 23 24   CBC Latest Ref Rng & Units 01/13/2019 12/22/2018 03/29/2018  WBC 4.0 - 10.5 K/uL 10.0 5.6 7.5  Hemoglobin 13.0 - 17.0 g/dL 15.1 11.8(L) 13.3  Hematocrit 39.0 - 52.0 % 47.6 35.6(L) 40.4  Platelets 150 - 400 K/uL 256 283 267   Lipid Panel  No results found for: CHOL, TRIG, HDL, CHOLHDL, VLDL, LDLCALC, LDLDIRECT HEMOGLOBIN A1C No results found for: HGBA1C,  MPG TSH Recent Labs    01/13/19 1955  TSH 0.550    Cardiac Studies:   Coronary angiogram 10/13/2007: Mild diffuse CAD involving LAD and circumflex coronary artery.  RCA occluded in the proximal segment, with left-to-right and right-to-left right region collaterals.  Ejection fraction 55% with mild inferobasal hypokinesis.  Mild aortic stenosis.  Lexiscan sestamibi stress test 03/17/2014: 1. Resting EKG demonstrated normal sinus rhythm. Initially patient exercised for 8 minutes and 35 seconds on a Bruce protocol and achieved 13.4 METs. However patient achieved only 73% of age-predicted maximum heart rate, hence stress test had to be terminated and switched over to [place] sestamibi stress test. There is no ST-T wave changes for ischemia with exercise stress test, Lexiscan stress EKG revealed no ST-T wave changes ischemia. 2. The quality of the stress images was good. There is minimal inferior wall diaphragmatic attenuation artifact without evidence of ischemia. Left ventricle systolic function Related by QGS was 64%. The left ventricle was normal in size both in rest and stress images. This is a low risk study.  Echocardiogram [12/03/2016]: Left ventricle cavity is normal in size. Mild concentric hypertrophy of the left ventricle. Normal global wall motion. Normal diastolic filling pattern. Calculated EF 66%. Left atrial cavity is moderately dilated at 4.7 cm. Mild aortic valve leaflet thickening with mild calcification. No evidence of aortic valve stenosis. Mild to moderate aortic regurgitation. Mild (Grade I) mitral regurgitation. Mild tricuspid regurgitation. Mild pulmonary hypertension. Pulmonary artery systolic pressure is estimated at 34 mm Hg. The aortic root is mildly dilated at 4.0 cm. Compared to the study done on 03/30/2014, previously aortic regurgitation was very mild. Pulmonary hypertension is new.   Assessment  1.  New onset atrial fibrillation with rapid ventricular  response CHA2DS2-VASc Score is 2.  -(CHF; HTN; vasc disease DM,  Male = 1; Age <65 =0; 65-74 = 1,  >75 =2; stroke = 2).  -(Yearly risk of stroke: Score of 1=1.3; 2=2.2; 3=3.2; 4=4; 5=6.7;  6=9.8; 7=>9.8)  2.  Coronary artery disease with history of known occluded RCA by angiography in 2009 3.  Hypertension 4.  Moderate obesity 5.  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  EKG 01/13/2019: Atrial fibrillation with rapid ventricular response at the rate of 160 bpm, LVH with repolarization abnormality, cannot exclude inferior and lateral ischemia. EKG 11/27/2017: Sinus bradycardia at 50 bpm, left atrial enlargement, normal axis, LVH.  No evidence of ischemia.  One PAC.  Recommendations:   Extremely difficult situation in view of difficulty and anticoagulating the patient.  Initially when his heart rate was uncontrolled we were thinking of direct-current cardioversion and hopefully as it was only new onset with symptoms starting this afternoon, he would do well.  As there is absolute contraindication to anticoagulation, options would be to rate control and see how he does versus TEE guided cardioversion however it still leads to the fact that we cannot anticoagulate him.  After long discussions, we decided to perform rate control strategy only, but I will also start him on sotalol hopefully he will convert to sinus rhythm today.  We will hold off on starting Bystolic.  Continue IV diltiazem and switch him to p.o. diltiazem or see how he does on sotalol.  Hopefully this will also improve his blood pressure.  We will rule him out for coronary artery disease progression and non-STEMI while performing serial serum troponin.  Patient is DNR.  Adrian Prows, MD, [facility] 01/13/2019, 10:53 PM [facility] [place] Pager: [phone] Office: [phone] If no answer Cell [phone]

## 2019-01-13 NOTE — ED Notes (Signed)
ED TO INPATIENT HANDOFF REPORT  ED Nurse Name and Phone #: [place] G, [phone]  S Name/Age/Gender Alejandro [name]. 63 y.o. male Room/Bed: RESB/RESB  Code Status   Code Status: DNR  Home/SNF/Other Home Patient oriented to: self, place, time and situation Is this baseline? Yes   Triage Complete: Triage complete  Chief Complaint Chest Pain  Triage Note Patient has had mid-chest pain throughout the day, it recently got worse in the past 45 min after dinner. Radiated to his jaw for a minute and then resolved. Patient was a patient in a hospital in [place] and got back on Tuesday, on a high-dose steroid regimen. Pulse 152, 98% RA. Hx of metastatic prostate cancer, and radiation.   Allergies Allergies  Allergen Reactions  . Codeine Nausea Only    Level of Care/Admitting Diagnosis ED Disposition    ED Disposition Condition [facility]: [facility] [100102]  Level of Care: Stepdown [14]  Admit to SDU based on following criteria: Cardiac Instability:  Patients experiencing chest pain, unconfirmed MI and stable, arrhythmias and CHF requiring medical management and potentially compromising patient's stability  Covid Evaluation: Screening Protocol (No Symptoms)  Diagnosis: Atrial fibrillation ([place]) [427.31.ICD-9-CM]  Admitting Physician: Frederica Kuster  Attending Physician: Adrian Prows [[id number]  PT Class (Do Not Modify): Observation [104]  PT Acc Code (Do Not Modify): Observation [10022]       B Medical/Surgery History Past Medical History:  Diagnosis Date  . ANXIETY   . AORTIC STENOSIS, MILD   . CHEST PAIN-UNSPECIFIED   . DYSPNEA   . HYPERCHOLESTEROLEMIA   . Hypertension   . INSOMNIA   . OSA on CPAP 05/31/2013  . OSTEOARTHRITIS, KNEE   . Prostate cancer [facility])    Past Surgical History:  Procedure Laterality Date  . COLONOSCOPY    . ELBOW SURGERY    . LUMBAR LAMINECTOMY  Late 1990's  . NEUROPLASTY / TRANSPOSITION  MEDIAN NERVE AT CARPAL TUNNEL BILATERAL    . PROSTATE BIOPSY       A IV Location/Drains/Wounds Patient Lines/Drains/Airways Status   Active Line/Drains/Airways    Name:   Placement date:   Placement time:   Site:   Days:   Peripheral IV 01/13/19 Right Antecubital   01/13/19    1940    Antecubital   less than 1   Peripheral IV 01/13/19 Left Wrist   01/13/19    1956    Wrist   less than 1          Intake/Output Last 24 hours No intake or output data in the 24 hours ending 01/13/19 2304  Labs/Imaging Results for orders placed or performed during the hospital encounter of 01/13/19 (from the past 48 hour(s))  Basic metabolic panel     Status: Abnormal   Collection Time: 01/13/19  7:41 PM  Result Value Ref Range   Sodium 132 (L) 135 - 145 mmol/L   Potassium 4.4 3.5 - 5.1 mmol/L   Chloride 98 98 - 111 mmol/L   CO2 20 (L) 22 - 32 mmol/L   Glucose, Bld 184 (H) 70 - 99 mg/dL   BUN 34 (H) 8 - 23 mg/dL   Creatinine, Ser 0.88 0.61 - 1.24 mg/dL   Calcium 8.6 (L) 8.9 - 10.3 mg/dL   GFR calc non Af Amer >60 >60 mL/min   GFR calc Af Amer >60 >60 mL/min   Anion gap 14 5 - 15    Comment: Performed at [facility]  [place] [address], [place], [place] [address]  CBC     Status: None   Collection Time: 01/13/19  7:41 PM  Result Value Ref Range   WBC 10.0 4.0 - 10.5 K/uL   RBC 4.94 4.22 - 5.81 MIL/uL   Hemoglobin 15.1 13.0 - 17.0 g/dL   HCT 47.6 39.0 - 52.0 %   MCV 96.4 80.0 - 100.0 fL   MCH 30.6 26.0 - 34.0 pg   MCHC 31.7 30.0 - 36.0 g/dL   RDW 14.1 11.5 - 15.5 %   Platelets 256 150 - 400 K/uL   nRBC 0.0 0.0 - 0.2 %    Comment: Performed at [facility], [place] [address], [place], [place] [address]  Troponin I - ONCE - STAT     Status: Abnormal   Collection Time: 01/13/19  7:41 PM  Result Value Ref Range   Troponin I 0.07 (HH) <0.03 ng/mL    Comment: CRITICAL RESULT CALLED TO, READ BACK BY AND VERIFIED WITH: rn j oxendaun at 2015 01/13/19 cru[place]hank  a Performed at [facility], [place] [address] [place], [place] [address]   Magnesium     Status: Abnormal   Collection Time: 01/13/19  7:54 PM  Result Value Ref Range   Magnesium 3.1 (H) 1.7 - 2.4 mg/dL    Comment: Performed at [facility], [place] [address] [place], [place] [address]  TSH     Status: None   Collection Time: 01/13/19  7:55 PM  Result Value Ref Range   TSH 0.550 0.350 - 4.500 uIU/mL    Comment: REPEATED TO VERIFY Performed at [facility], [place] [address], [place], [place] [address]    No results found.  Pending Labs Unresulted Labs (From admission, onward)    Start     Ordered   01/13/19 2253  Troponin I - Now Then Q6H  Now then every 6 hours,   R (with STAT occurrences)     01/13/19 2252   01/13/19 2247  HIV antibody (Routine Testing)  Once,   STAT     01/13/19 2251   01/13/19 2151  SARS Coronavirus 2 (CEPHEID - Performed in [facility] hospital lab), Hosp Order  (Asymptomatic Patients Labs)  Once,   STAT    Question:  Rule Out  Answer:  Yes   01/13/19 2150          Vitals/Pain Today's Vitals   01/13/19 2135 01/13/19 2145 01/13/19 2215 01/13/19 2230  BP:  122/80 110/73 (!) 130/95  Pulse:   (!) 101 (!) 104  Resp:  17 14 18   SpO2:  99% 97% 98%  Weight:      Height:      PainSc: Asleep       Isolation Precautions No active isolations  Medications Medications  sodium chloride flush (NS) 0.9 % injection 3 mL (3 mLs Intravenous Not Given 01/13/19 2030)  etomidate (AMIDATE) injection 15.52 mg (0 mg Intravenous Hold 01/13/19 2255)  acetaminophen (TYLENOL) tablet 650 mg (has no administration in time range)  ondansetron (ZOFRAN) injection 4 mg (has no administration in time range)  diltiazem (CARDIZEM) 100 mg in dextrose 5 % 100 mL (1 mg/mL) infusion (has no administration in time range)  sotalol (BETAPACE) tablet 80 mg (has no administration in time range)  dexamethasone (DECADRON) tablet 2 mg (has  no administration in time range)  diltiazem (CARDIZEM) 1 mg/mL load via infusion 15 mg (15 mg Intravenous Bolus from  Bag 01/13/19 2022)  morphine 4 MG/ML injection 4 mg (4 mg Intravenous Given 01/13/19 2025)    Mobility walks Low fall risk

## 2019-01-13 NOTE — ED Notes (Signed)
Patient to Res B from triage after 12 lead done-will finish triage in room-patient is diaphoretic and complaining of 10/10 mid chest pain-connected to hardwire monitor-MP atrial fib with RVR 160's-BP q 15 minutes-EDP made aware and has reviewed EKG-PIV and cardiac labs drawn.

## 2019-01-13 NOTE — ED Provider Notes (Signed)
[facility] DEPT Provider Note   CSN: [id number] Arrival date & time: 01/13/19  1924     History   Chief Complaint Chief Complaint  Patient presents with   Chest Pain    HPI Alejandro [name]. is a 63 y.o. male.    He has a history of metastatic prostate cancer and is just returned from [place] where he was there for 2 weeks for a skull mass that ended up causing him neurologic dysfunction.  He received steroids and radiation there with improvement in his symptoms and is still on a steroid taper.  They also had adjusted some medications to account for his higher blood sugar and took him off his Bystolic because his heart rates were low.  He said after he took his dose of steroids at 2 PM he acutely started experiencing an aching pain in his chest moderate to severe in nature that is been ongoing since then.  It caused him to be diaphoretic.  He is never had this before.  He knows he has coronary disease because he had a cath in the past but he has not had any intervention.  The history is provided by the patient.  Chest Pain Pain location:  Substernal area Pain quality: aching   Pain radiates to:  Does not radiate Pain severity:  Moderate Onset quality:  Sudden Duration:  6 hours Timing:  Constant Progression:  Unchanged Chronicity:  New Context: at rest   Relieved by:  None tried Worsened by:  Exertion Ineffective treatments:  None tried Associated symptoms: diaphoresis   Associated symptoms: no abdominal pain, no back pain, no cough, no fever, no shortness of breath, no syncope and no vomiting   Risk factors: coronary artery disease, high cholesterol and hypertension     Past Medical History:  Diagnosis Date   ANXIETY    AORTIC STENOSIS, MILD    CHEST PAIN-UNSPECIFIED    DYSPNEA    HYPERCHOLESTEROLEMIA    Hypertension    INSOMNIA    OSA on CPAP 05/31/2013   OSTEOARTHRITIS, KNEE    Prostate cancer [facility])      Patient Active Problem List   Diagnosis Date Noted   Orthostatic hypotension 12/03/2018   Abdominal aortic atherosclerosis ([place]): Severe calcification and ectasia 12/03/2018   Encounter for antineoplastic chemotherapy 12/04/2017   Prostate cancer metastatic to bone ([place]) 11/19/2017   Fever in adult 11/19/2017   Mucositis due to antineoplastic therapy 11/19/2017   Lactic acid acidosis 11/19/2017   Fever 11/19/2017   Neutropenic fever ([place]) 10/30/2017   Clinical stage T1c N1 M0 adenocarcinoma of the prostate with a Gleason's score of 4+4 and a PSA of 6.4 03/21/2014   OSA on CPAP 05/31/2013   DYSPNEA 01/23/2010   CHEST PAIN-UNSPECIFIED 01/23/2010   ANXIETY 12/10/2009   OSTEOARTHRITIS, KNEE 12/10/2009   INSOMNIA 12/10/2009   HYPERCHOLESTEROLEMIA 12/07/2009   Essential hypertension 12/07/2009   AORTIC STENOSIS, MILD 12/07/2009   CAD, native coronary artery: Occluded RCA (CTO) 2009 10/13/2007    Past Surgical History:  Procedure Laterality Date   COLONOSCOPY     ELBOW SURGERY     LUMBAR LAMINECTOMY  Late 1990's   NEUROPLASTY / TRANSPOSITION MEDIAN NERVE AT CARPAL TUNNEL BILATERAL     PROSTATE BIOPSY          Home Medications    Prior to Admission medications   Medication Sig Start Date End Date Taking? Authorizing Provider  Alirocumab (PRALUENT [place]) Inject 75 mg into the skin every  14 (fourteen) days.     [provider]  amitriptyline (ELAVIL) 10 MG tablet daily. 11/08/18   [provider]  amLODipine (NORVASC) 5 MG tablet TAKE 1 TABLET BY MOUTH DAILY 12/07/18   Miquel Dunn, NP  aspirin 325 MG EC tablet Take 325 mg by mouth daily.    [provider]  BYSTOLIC 10 MG tablet TAKE 1 TABLET BY MOUTH AT BEDTIME 12/07/18   Miquel Dunn, NP  calcium-vitamin D (OSCAL WITH D) 500-200 MG-UNIT tablet Take 1 tablet by mouth 2 (two) times daily. 03/16/18   Wyatt Portela, MD  cholecalciferol (VITAMIN D3) 25 MCG (1000 UT)  tablet Take 1,000 Units by mouth daily.    [provider]  ezetimibe (ZETIA) 10 MG tablet Take 10 mg by mouth daily.    [provider]  fluticasone (FLONASE) 50 MCG/ACT nasal spray Place 1-2 sprays into both nostrils daily. 12/16/18   [provider]  loratadine (CLARITIN) 10 MG tablet Take 10 mg by mouth daily.    [provider]  losartan (COZAAR) 50 MG tablet Take 50 mg by mouth daily.  12/04/17   [provider]  NUBEQA 300 MG TABS 2 tabs bid 11/11/18   [provider]  tamsulosin (FLOMAX) 0.4 MG CAPS capsule TAKE 1 CAPSULE BY MOUTH TWICE DAILY 01/03/19   Wyatt Portela, MD    Family History Family History  Problem Relation Age of Onset   Cancer Father        prostate    Social History Social History   Tobacco Use   Smoking status: Former Smoker    Packs/day: 3.00    Years: 25.00    Pack years: 75.00    Types: Cigars, Cigarettes    Quit date: 12/03/1998    Years since quitting: 20.1   Smokeless tobacco: Never Used   Tobacco comment: Former 3 ppd smoker for 30 years quit heavy smoking 15 years ago  Substance Use Topics   Alcohol use: Yes    Alcohol/week: 12.0 standard drinks    Types: 12 Standard drinks or equivalent per week    Comment: 2 drinks per day   Drug use: No     Allergies   Codeine   Review of Systems Review of Systems  Constitutional: Positive for diaphoresis. Negative for fever.  HENT: Negative for sore throat.   Eyes: Positive for visual disturbance.  Respiratory: Negative for cough and shortness of breath.   Cardiovascular: Positive for chest pain. Negative for syncope.  Gastrointestinal: Negative for abdominal pain and vomiting.  Genitourinary: Negative for dysuria.  Musculoskeletal: Negative for back pain.  Skin: Negative for rash.  Neurological: Negative for syncope.     Physical Exam Updated Vital Signs BP (!) 151/93 (BP Location: Left Arm)    Pulse (!) 154    Resp (!) 22    Ht 5'  10" (1.778 m)    Wt 103.4 kg    SpO2 98%    BMI 32.71 kg/m   Physical Exam Vitals signs and nursing note reviewed.  Constitutional:      Appearance: He is well-developed.  HENT:     Head: Normocephalic and atraumatic.  Eyes:     Conjunctiva/sclera: Conjunctivae normal.  Neck:     Musculoskeletal: Neck supple.  Cardiovascular:     Rate and Rhythm: Tachycardia present. Rhythm irregular.     Heart sounds: No murmur.  Pulmonary:     Effort: Pulmonary effort is normal. No respiratory distress.  Breath sounds: Normal breath sounds.  Abdominal:     Palpations: Abdomen is soft. There is no mass.     Tenderness: There is no abdominal tenderness. There is no guarding.  Musculoskeletal:     Right lower leg: He exhibits no tenderness. No edema.     Left lower leg: He exhibits no tenderness. No edema.  Skin:    General: Skin is warm and dry.     Capillary Refill: Capillary refill takes less than 2 seconds.  Neurological:     General: No focal deficit present.     Mental Status: He is alert.      ED Treatments / Results  Labs (all labs ordered are listed, but only abnormal results are displayed) Labs Reviewed  CBC  BASIC METABOLIC PANEL  TROPONIN I  MAGNESIUM  TSH    EKG    Radiology No results found.  Procedures .Critical Care Performed by: Hayden Rasmussen, MD Authorized by: Hayden Rasmussen, MD   Critical care provider statement:    Critical care time (minutes):  45   Critical care time was exclusive of:  Separately billable procedures and treating other patients   Critical care was necessary to treat or prevent imminent or life-threatening deterioration of the following conditions:  Cardiac failure   Critical care was time spent personally by me on the following activities:  Discussions with consultants, evaluation of patient's response to treatment, examination of patient, ordering and performing treatments and interventions, ordering and review of laboratory  studies, ordering and review of radiographic studies, pulse oximetry, re-evaluation of patient's condition, obtaining history from patient or surrogate, review of old charts and development of treatment plan with patient or surrogate   I assumed direction of critical care for this patient from another provider in my specialty: no     (including critical care time)  Medications Ordered in ED Medications  sodium chloride flush (NS) 0.9 % injection 3 mL (has no administration in time range)  diltiazem (CARDIZEM) 1 mg/mL load via infusion 15 mg (has no administration in time range)    And  diltiazem (CARDIZEM) 100 mg in dextrose 5 % 100 mL (1 mg/mL) infusion (has no administration in time range)     Initial Impression / Assessment and Plan / ED Course  I have reviewed the triage vital signs and the nursing notes.  Pertinent labs & imaging results that were available during my care of the patient were reviewed by me and considered in my medical decision making (see chart for details).  Clinical Course as of Jan 14 755  Thu Jan 13, 2019  2052 Differential diagnosis includes A. fib with RVR, metabolic derangement, ACS, hypothyroidism   [MB]  2053 Patient's labs coming back and he has a slight elevation of troponin but his metabolic stone look too bad.  Still waiting on the magnesium and TSH.   [MB]  2053   His heart rate is down around 100 although still bouncing around but he is feeling better and pain is controlled.   [MB]  2139 I reviewed the case with patient's cardiologist Dr. Einar Gip who did understand the conundrum of anticoagulation.  He said if the patient was agreeable to cardiovert him here and he would follow-up with him.  The patient states he would rather get admitted to the hospital and see if he would convert on his own rather than undergo cardioversion.  I will check back with Dr. Einar Gip regarding this.   [MB]  2151 I  reviewed this with Dr. Einar Gip and he said he would come down  and speak with the patient.   [MB]    Clinical Course User Index [MB] Hayden Rasmussen, MD   Alejandro [name]. was evaluated in Emergency Department on 01/13/2019 for the symptoms described in the history of present illness. He was evaluated in the context of the global COVID-19 pandemic, which necessitated consideration that the patient might be at risk for infection with the SARS-CoV-2 virus that causes COVID-19. Institutional protocols and algorithms that pertain to the evaluation of patients at risk for COVID-19 are in a state of rapid change based on information released by regulatory bodies including the CDC and federal and state organizations. These policies and algorithms were followed during the patient's care in the ED.  CHA2DS2/VAS Stroke Risk Points      N/A >= 2 Points: High Risk  1 - 1.99 Points: Medium Risk  0 Points: Low Risk    A final score could not be computed because of missing components.: Last  Change: N/A     This score determines the patient's risk of having a stroke if the  patient has atrial fibrillation.      This score is not applicable to this patient. Components are not  calculated.        Final Clinical Impressions(s) / ED Diagnoses   Final diagnoses:  Atrial fibrillation with rapid ventricular response (HCC)  Chest pain, unspecified type  Prostate cancer metastatic to bone [facility])    ED Discharge Orders         Ordered    Amb referral to AFIB Clinic     01/13/19 2000           Hayden Rasmussen, MD 01/14/19 [phone]

## 2019-01-13 NOTE — ED Triage Notes (Addendum)
Patient has had mid-chest pain throughout the day, it recently got worse in the past 45 min after dinner. Radiated to his jaw for a minute and then resolved. Patient was a patient in a hospital in [place] and got back on Tuesday, on a high-dose steroid regimen. Pulse 152, 98% RA. Hx of metastatic prostate cancer, and radiation.

## 2019-01-14 ENCOUNTER — Other Ambulatory Visit: Payer: Self-pay | Admitting: Cardiology

## 2019-01-14 ENCOUNTER — Other Ambulatory Visit: Payer: BLUE CROSS/BLUE SHIELD

## 2019-01-14 DIAGNOSIS — C61 Malignant neoplasm of prostate: Secondary | ICD-10-CM | POA: Diagnosis not present

## 2019-01-14 DIAGNOSIS — C7951 Secondary malignant neoplasm of bone: Secondary | ICD-10-CM | POA: Diagnosis not present

## 2019-01-14 DIAGNOSIS — I10 Essential (primary) hypertension: Secondary | ICD-10-CM | POA: Diagnosis not present

## 2019-01-14 DIAGNOSIS — I4891 Unspecified atrial fibrillation: Secondary | ICD-10-CM

## 2019-01-14 LAB — MRSA PCR SCREENING: MRSA by PCR: NEGATIVE

## 2019-01-14 LAB — TROPONIN I
Troponin I: 0.1 ng/mL (ref <0.03)
Troponin I: 0.08 ng/mL (ref <0.03)
Troponin I: 0.05 ng/mL (ref <0.03)

## 2019-01-14 LAB — HIV ANTIBODY (ROUTINE TESTING W REFLEX): HIV Screen 4th Generation wRfx: NONREACTIVE

## 2019-01-14 MED ORDER — CHLORHEXIDINE GLUCONATE CLOTH 2 % EX PADS: 6.0000 | MEDICATED_PAD | Freq: Every day | CUTANEOUS | Status: DC

## 2019-01-14 MED ORDER — DILTIAZEM HCL 60 MG PO TABS: 30.0000 mg | ORAL_TABLET | Freq: Three times a day (TID) | ORAL | 2 refills | Status: DC | PRN

## 2019-01-14 MED ORDER — DILTIAZEM HCL ER COATED BEADS 180 MG PO CP24: 180.0000 mg | ORAL_CAPSULE | Freq: Every day | ORAL | Status: DC

## 2019-01-14 MED ORDER — ACETAMINOPHEN 325 MG PO TABS: 650.0000 mg | ORAL_TABLET | ORAL | Status: DC | PRN

## 2019-01-14 MED ORDER — MORPHINE SULFATE ER 15 MG PO TBCR: 15.0000 mg | EXTENDED_RELEASE_TABLET | Freq: Every day | ORAL | Status: DC

## 2019-01-14 MED ORDER — DILTIAZEM HCL ER COATED BEADS 240 MG PO CP24: 240.0000 mg | ORAL_CAPSULE | Freq: Every day | ORAL | 3 refills | Status: DC

## 2019-01-14 NOTE — Plan of Care (Signed)
Care plan reviewed.

## 2019-01-14 NOTE — Discharge Summary (Signed)
Physician Discharge Summary  Patient ID: Alejandro [name]. MRN: [id number] DOB/AGE: September 18, 1955 63 y.o.  Admit date: 01/13/2019 Discharge date: 01/14/2019  Primary Discharge Diagnosis New onset atrial fibrillation CHA2DS2-VASc Score is 2.  Angina pectoris due to demand ischemia and type II MI. 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 EKG 11/27/2017: Sinus bradycardia at 50 bpm, left atrial enlargement, normal axis, LVH.  No evidence of ischemia.  One PAC.  Hospital Course: Patient admitted with chest pain that started suddenly associated with palpitations and upon arrival to the emergency room found to be in A. fib with RVR.  He was started on IV diltiazem with rate control.  In view of his intracranial bleed history and stage IV metastatic cancer, he was felt to be not a candidate for anticoagulation.  He was given 2 doses of sotalol in the evening and again in the morning in the hopes of conversion to sinus rhythm, but he persisted in A. fib, at this point as he was asymptomatic and rate control, it was felt that he can be discharged home on rate control strategy.  This was also the patient's preference.  He plans to move all his oncology evaluations and therapy to [facility], being treated in [place] so far.  He does have obstructive sleep apnea but has not been compliant with his CPAP and we discussed compliance.  Weight loss was discussed.  I like to see him in the office in 2 weeks for follow-up.  Recommendations on discharge: Rate control strategy recommended with regard to atrial fibrillation, not  being a candidate for anticoagulation.  If I do get clearance from oncology regarding anticoagulation, then he will be a candidate for possible cardioversion if he can tolerate at least 8 weeks of therapy. He was discharged home on diltiazem CD 240 mg daily and diltiazem plain 60 mg 1/2 to 1 tablet 3 times daily as needed for heart rate greater than 120 bpm.  Discharge Exam: Blood pressure 113/64, pulse 83, temperature 98.2 F (36.8 C), temperature source Oral, resp. rate 19, height 5\' 10"  (1.778 m), weight 102 kg, SpO2 95 %.  Physical Exam  Constitutional: He appears well-developed. No distress.  Moderately obese  HENT:  Head: Atraumatic.  Eyes: Conjunctivae are normal.  Neck: Neck supple. No JVD present. No thyromegaly present.  Cardiovascular: Intact distal pulses and normal pulses. An irregularly irregular rhythm present. Exam reveals no gallop, no S3 and no S4.  No murmur heard. S1 is variable, S2 is normal.   Pulmonary/Chest: Effort normal and breath sounds normal.  Abdominal: Soft. Bowel sounds are normal.  Musculoskeletal: Normal range of motion.        General: No edema.  Neurological: He is alert.  Skin: Skin is warm and dry.  Psychiatric: He has a normal mood and affect.   Labs:   Lab Results  Component Value Date   WBC 7.9 01/13/2019   HGB 13.4 01/13/2019   HCT 40.8 01/13/2019   MCV 96.2 01/13/2019   PLT 216 01/13/2019    Recent Labs  Lab 01/13/19 1941 01/13/19 2309  NA 132*  --   K 4.4  --   CL 98  --  CO2 20*  --   BUN 34*  --   CREATININE 0.88 0.91  CALCIUM 8.6*  --   GLUCOSE 184*  --     Lipid Panel  No results found for: CHOL, TRIG, HDL, CHOLHDL, VLDL, LDLCALC  BNP (last 3 results) No results for input(s): BNP in the last 8760 hours.  HEMOGLOBIN A1C No results found for: HGBA1C, MPG  Cardiac Panel (last 3 results) Recent Labs    01/13/19 2309 01/14/19 0524 01/14/19 1058  TROPONINI 0.10* 0.08* 0.05*   TSH Recent Labs     01/13/19 1955  TSH 0.550     Radiology: Ct Chest W Contrast  Result Date: 12/23/2018 CLINICAL DATA:  Prostate cancer with bone metastases. EXAM: CT CHEST, ABDOMEN, AND PELVIS WITH CONTRAST TECHNIQUE: Multidetector CT imaging of the chest, abdomen and pelvis was performed following the standard protocol during bolus administration of intravenous contrast. CONTRAST:  155mL ISOVUE-300 IOPAMIDOL (ISOVUE-300) INJECTION 61% COMPARISON:  None. FINDINGS: CT CHEST FINDINGS Cardiovascular: The heart size is normal. No substantial pericardial effusion. Coronary artery calcification is evident. Mediastinum/Nodes: No mediastinal lymphadenopathy. There is no hilar lymphadenopathy. The esophagus has normal imaging features. There is no axillary lymphadenopathy. 16 mm short axis right retrocrural node visible on image 58/series 3. 17 mm short axis left retrocrural node visible on 62/3. Lungs/Pleura: The central tracheobronchial airways are patent. Dense nodule medial right lung apex may be a granuloma or migrated brachytherapy seed. Centrilobular emphsyema noted. 10 mm ground-glass nodule noted posterior left lower lobe (109/5). Scarring noted in the lingula. No pleural effusion. Musculoskeletal: Sclerotic bone lesions noted in the sternum, ribs, and thoracic spine. CT ABDOMEN PELVIS FINDINGS Hepatobiliary: No suspicious focal abnormality within the liver parenchyma. There is no evidence for gallstones, gallbladder wall thickening, or pericholecystic fluid. No intrahepatic or extrahepatic biliary dilation. Pancreas: No focal mass lesion. No dilatation of the main duct. No intraparenchymal cyst. No peripancreatic edema. Spleen: No splenomegaly. No focal mass lesion. Adrenals/Urinary Tract: No adrenal nodule or mass. Kidneys unremarkable. No evidence for hydroureter. The urinary bladder appears normal for the degree of distention. Stomach/Bowel: Stomach is unremarkable. No gastric wall thickening. No evidence of outlet  obstruction. Duodenum is normally positioned as is the ligament of Treitz. No small bowel wall thickening. No small bowel dilatation. The terminal ileum is normal. The appendix is not visualized, but there is no edema or inflammation in the region of the cecum. No gross colonic mass. No colonic wall thickening. Diverticular changes are noted in the left colon without evidence of diverticulitis. Vascular/Lymphatic: There is abdominal aortic atherosclerosis without aneurysm. Relatively bulky lymphadenopathy identified in the upper abdomen. 2.5 cm short axis hepato duodenal ligament lymph node is identified posterior to the portal vein (62/3). 2.1 cm short axis pericaval node visible on 63/3. 1.3 cm aortocaval lymph node is associated with a 1.3 cm left para-aortic node on 65/3. Multiple small concerning lymph nodes are seen in the retroperitoneal space along the aorta down to the bifurcation. No pelvic sidewall lymphadenopathy. Lymph nodes in the right groin measure up to 16 mm short axis. Reproductive: Brachytherapy seeds noted in the prostate gland. Other: No intraperitoneal free fluid. Musculoskeletal: Bilateral small groin hernias contain only fat. Heterogeneous sclerotic lesions noted both iliac bones and lumbar spine, compatible with metastatic involvement. IMPRESSION: 1. Lymphadenopathy in the retrocrural spaces and abdomen, consistent with metastatic disease. Mild lymphadenopathy in the right groin is also concerning. 2. Widespread sclerotic bone metastases. 3. 10 mm ground-glass nodule posterior left lower lobe. Initial follow-up  with CT at 6-12 months is recommended to confirm persistence. If persistent, repeat CT is recommended every 2 years until 5 years of stability has been established. This recommendation follows the consensus statement: Guidelines for Management of Incidental Pulmonary Nodules Detected on CT Images: From the Fleischner Society 2017; Radiology 2017; 284:228-243. Aortic Atherosclerosis  (ICD10-I70.0). Emphysema. (BSJ62-E36.9) Electronically Signed   By: Misty Stanley M.D.   On: 12/23/2018 11:18   Nm Bone Scan Whole Body  Result Date: 12/23/2018 CLINICAL DATA:  Prostate cancer EXAM: NUCLEAR MEDICINE WHOLE BODY BONE SCAN TECHNIQUE: Whole body anterior and posterior images were obtained approximately 3 hours after intravenous injection of radiopharmaceutical. RADIOPHARMACEUTICALS:  19.5 mCi Technetium-51m MDP IV COMPARISON:  03/07/2014.  CT chest, abdomen and pelvis 12/22/2018 FINDINGS: Extensive areas of abnormal bony uptake noted throughout the calvarium, manubrium, bilateral ribs, thoracic and lumbar vertebral bodies and iliac bones, left greater than right compatible with osseous metastatic disease. Soft tissue activity unremarkable. IMPRESSION: Evidence of extensive osseous metastatic disease as described above. Electronically Signed   By: Rolm Baptise M.D.   On: 12/23/2018 13:46   Ct Abdomen Pelvis W Contrast  Result Date: 12/23/2018 CLINICAL DATA:  Prostate cancer with bone metastases. EXAM: CT CHEST, ABDOMEN, AND PELVIS WITH CONTRAST TECHNIQUE: Multidetector CT imaging of the chest, abdomen and pelvis was performed following the standard protocol during bolus administration of intravenous contrast. CONTRAST:  188mL ISOVUE-300 IOPAMIDOL (ISOVUE-300) INJECTION 61% COMPARISON:  None. FINDINGS: CT CHEST FINDINGS Cardiovascular: The heart size is normal. No substantial pericardial effusion. Coronary artery calcification is evident. Mediastinum/Nodes: No mediastinal lymphadenopathy. There is no hilar lymphadenopathy. The esophagus has normal imaging features. There is no axillary lymphadenopathy. 16 mm short axis right retrocrural node visible on image 58/series 3. 17 mm short axis left retrocrural node visible on 62/3. Lungs/Pleura: The central tracheobronchial airways are patent. Dense nodule medial right lung apex may be a granuloma or migrated brachytherapy seed. Centrilobular emphsyema  noted. 10 mm ground-glass nodule noted posterior left lower lobe (109/5). Scarring noted in the lingula. No pleural effusion. Musculoskeletal: Sclerotic bone lesions noted in the sternum, ribs, and thoracic spine. CT ABDOMEN PELVIS FINDINGS Hepatobiliary: No suspicious focal abnormality within the liver parenchyma. There is no evidence for gallstones, gallbladder wall thickening, or pericholecystic fluid. No intrahepatic or extrahepatic biliary dilation. Pancreas: No focal mass lesion. No dilatation of the main duct. No intraparenchymal cyst. No peripancreatic edema. Spleen: No splenomegaly. No focal mass lesion. Adrenals/Urinary Tract: No adrenal nodule or mass. Kidneys unremarkable. No evidence for hydroureter. The urinary bladder appears normal for the degree of distention. Stomach/Bowel: Stomach is unremarkable. No gastric wall thickening. No evidence of outlet obstruction. Duodenum is normally positioned as is the ligament of Treitz. No small bowel wall thickening. No small bowel dilatation. The terminal ileum is normal. The appendix is not visualized, but there is no edema or inflammation in the region of the cecum. No gross colonic mass. No colonic wall thickening. Diverticular changes are noted in the left colon without evidence of diverticulitis. Vascular/Lymphatic: There is abdominal aortic atherosclerosis without aneurysm. Relatively bulky lymphadenopathy identified in the upper abdomen. 2.5 cm short axis hepato duodenal ligament lymph node is identified posterior to the portal vein (62/3). 2.1 cm short axis pericaval node visible on 63/3. 1.3 cm aortocaval lymph node is associated with a 1.3 cm left para-aortic node on 65/3. Multiple small concerning lymph nodes are seen in the retroperitoneal space along the aorta down to the bifurcation. No pelvic sidewall lymphadenopathy. Lymph nodes in  the right groin measure up to 16 mm short axis. Reproductive: Brachytherapy seeds noted in the prostate gland.  Other: No intraperitoneal free fluid. Musculoskeletal: Bilateral small groin hernias contain only fat. Heterogeneous sclerotic lesions noted both iliac bones and lumbar spine, compatible with metastatic involvement. IMPRESSION: 1. Lymphadenopathy in the retrocrural spaces and abdomen, consistent with metastatic disease. Mild lymphadenopathy in the right groin is also concerning. 2. Widespread sclerotic bone metastases. 3. 10 mm ground-glass nodule posterior left lower lobe. Initial follow-up with CT at 6-12 months is recommended to confirm persistence. If persistent, repeat CT is recommended every 2 years until 5 years of stability has been established. This recommendation follows the consensus statement: Guidelines for Management of Incidental Pulmonary Nodules Detected on CT Images: From the Fleischner Society 2017; Radiology 2017; 284:228-243. Aortic Atherosclerosis (ICD10-I70.0). Emphysema. ([id number]-A25.9) Electronically Signed   By: Misty Stanley M.D.   On: 12/23/2018 11:18   Diltiazem CD 240 mg daily was also prescribed, this was missed during reconciliation and patient was discharged.  Outpatient medication sent.  FOLLOW UP PLANS AND APPOINTMENTS  Allergies as of 01/14/2019      Reactions   Codeine Nausea Only      Medication List    STOP taking these medications   amLODipine 5 MG tablet Commonly known as: NORVASC   Bystolic 10 MG tablet Generic drug: nebivolol     TAKE these medications   acetaminophen 325 MG tablet Commonly known as: TYLENOL Take 650 mg by mouth every 6 (six) hours as needed for moderate pain.   amitriptyline 10 MG tablet Commonly known as: ELAVIL Take 20 mg by mouth at bedtime.   calcium-vitamin D 500-200 MG-UNIT tablet Commonly known as: OSCAL WITH D Take 1 tablet by mouth 2 (two) times daily.   cholecalciferol 25 MCG (1000 UT) tablet Commonly known as: VITAMIN D3 Take 1,000 Units by mouth daily.   dexamethasone 1 MG tablet Commonly known as:  DECADRON Take 2 mg by mouth See admin instructions. Patient needs 4mg  qhs tonight, 01/13/19   diltiazem 60 MG tablet Commonly known as: Cardizem Take 0.5 tablets (30 mg total) by mouth 3 (three) times daily as needed (For HR > 120/min).   ezetimibe 10 MG tablet Commonly known as: ZETIA Take 10 mg by mouth daily.   fluticasone 50 MCG/ACT nasal spray Commonly known as: FLONASE Place 1-2 sprays into both nostrils daily as needed for allergies.   ibuprofen 600 MG tablet Commonly known as: ADVIL Take 600 mg by mouth every 6 (six) hours as needed for moderate pain.   loratadine 10 MG tablet Commonly known as: CLARITIN Take 10 mg by mouth daily.   losartan 50 MG tablet Commonly known as: COZAAR Take 50 mg by mouth daily.   morphine 15 MG 12 hr tablet Commonly known as: MS CONTIN Take 15 mg by mouth 2 (two) times daily as needed for pain.   tamsulosin 0.4 MG Caps capsule Commonly known as: FLOMAX TAKE 1 CAPSULE BY MOUTH TWICE DAILY   TURMERIC PO Take 1 tablet by mouth daily.       Adrian Prows, MD 01/14/2019, 11:58 AM  Pager: [phone] Office: [phone] If no answer: [phone]

## 2019-01-14 NOTE — Progress Notes (Signed)
Discharge instructions (including medications) discussed with and copy provided to patient/caregiver 

## 2019-01-17 ENCOUNTER — Other Ambulatory Visit: Payer: Self-pay

## 2019-01-17 ENCOUNTER — Telehealth: Payer: Self-pay | Admitting: Oncology

## 2019-01-17 ENCOUNTER — Inpatient Hospital Stay: Payer: BC Managed Care – PPO

## 2019-01-17 ENCOUNTER — Inpatient Hospital Stay: Payer: BC Managed Care – PPO | Attending: Oncology | Admitting: Oncology

## 2019-01-17 VITALS — BP 118/90 | HR 93 | Temp 98.2°F | Resp 18 | Ht 70.0 in | Wt 223.9 lb

## 2019-01-17 DIAGNOSIS — C7951 Secondary malignant neoplasm of bone: Secondary | ICD-10-CM

## 2019-01-17 DIAGNOSIS — C786 Secondary malignant neoplasm of retroperitoneum and peritoneum: Secondary | ICD-10-CM | POA: Diagnosis not present

## 2019-01-17 DIAGNOSIS — R42 Dizziness and giddiness: Secondary | ICD-10-CM | POA: Diagnosis not present

## 2019-01-17 DIAGNOSIS — R002 Palpitations: Secondary | ICD-10-CM

## 2019-01-17 DIAGNOSIS — R29818 Other symptoms and signs involving the nervous system: Secondary | ICD-10-CM | POA: Diagnosis not present

## 2019-01-17 DIAGNOSIS — I4891 Unspecified atrial fibrillation: Secondary | ICD-10-CM

## 2019-01-17 DIAGNOSIS — Z923 Personal history of irradiation: Secondary | ICD-10-CM | POA: Insufficient documentation

## 2019-01-17 DIAGNOSIS — C61 Malignant neoplasm of prostate: Secondary | ICD-10-CM

## 2019-01-17 DIAGNOSIS — Z79899 Other long term (current) drug therapy: Secondary | ICD-10-CM | POA: Diagnosis not present

## 2019-01-17 LAB — CMP (CANCER CENTER ONLY)
ALT: 109 U/L — ABNORMAL HIGH (ref 0–44)
AST: 37 U/L (ref 15–41)
Albumin: 3.5 g/dL (ref 3.5–5.0)
Alkaline Phosphatase: 47 U/L (ref 38–126)
Anion gap: 13 (ref 5–15)
BUN: 40 mg/dL — ABNORMAL HIGH (ref 8–23)
CO2: 20 mmol/L — ABNORMAL LOW (ref 22–32)
Calcium: 8.4 mg/dL — ABNORMAL LOW (ref 8.9–10.3)
Chloride: 98 mmol/L (ref 98–111)
Creatinine: 0.94 mg/dL (ref 0.61–1.24)
GFR, Est AFR Am: >60 mL/min (ref >60)
GFR, Estimated: >60 mL/min (ref >60)
Glucose, Bld: 156 mg/dL — ABNORMAL HIGH (ref 70–99)
Potassium: 4.9 mmol/L (ref 3.5–5.1)
Sodium: 131 mmol/L — ABNORMAL LOW (ref 135–145)
Total Bilirubin: 1 mg/dL (ref 0.3–1.2)
Total Protein: 6.2 g/dL — ABNORMAL LOW (ref 6.5–8.1)

## 2019-01-17 LAB — CBC WITH DIFFERENTIAL (CANCER CENTER ONLY)
Abs Immature Granulocytes: 0.1 10*3/uL — ABNORMAL HIGH (ref 0.00–0.07)
Basophils Absolute: 0 10*3/uL (ref 0.0–0.1)
Basophils Relative: 0 %
Eosinophils Absolute: 0 10*3/uL (ref 0.0–0.5)
Eosinophils Relative: 0 %
HCT: 41.7 % (ref 39.0–52.0)
Hemoglobin: 13.6 g/dL (ref 13.0–17.0)
Immature Granulocytes: 1 %
Lymphocytes Relative: 1 %
Lymphs Abs: 0.1 10*3/uL — ABNORMAL LOW (ref 0.7–4.0)
MCH: 31.1 pg (ref 26.0–34.0)
MCHC: 32.6 g/dL (ref 30.0–36.0)
MCV: 95.4 fL (ref 80.0–100.0)
Monocytes Absolute: 0.5 10*3/uL (ref 0.1–1.0)
Monocytes Relative: 7 %
Neutro Abs: 6.8 10*3/uL (ref 1.7–7.7)
Neutrophils Relative %: 91 %
Platelet Count: 170 10*3/uL (ref 150–400)
RBC: 4.37 MIL/uL (ref 4.22–5.81)
RDW: 13.8 % (ref 11.5–15.5)
WBC Count: 7.5 10*3/uL (ref 4.0–10.5)
nRBC: 0 % (ref 0.0–0.2)

## 2019-01-17 NOTE — Progress Notes (Signed)
Hematology and Oncology Follow Up Visit  Alejandro [name] [id number] Alejandro [name]: 63 year old man with advanced prostate cancer with disease to the bone and lymphadenopathy diagnosed in 2017.  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      Prior Therapy:   Status post biopsy done on July of 2015 followed by lymph node dissection completed in 2015 at [facility].  He was treated there with definitive radiation therapy utilizing seed implant as well as external beam radiation completed in 2016.  He developed advanced disease with bone involvement.  He received radiation therapy to the pelvis in 2017.  Xtandi 160 mg daily that was poorly tolerated and was discontinued.  He was switched to [name] which she took until 2019 and developed progression of disease with bony metastasis as well as PSA of 80.  Taxotere chemotherapy at 75 mg/m cycle 1 given in March 2019.  He is status post 6 cycles of chemotherapy last cycle given on February 03, 2018.   Status post clinical trial utilizing Darolutamide as well as LuPSAM a part of a clinical trial at [facility] in [place].  He is status post 4 cycles of therapy.  Last cycle given on October 01, 2018.  Therapy discontinued as a progression of disease.   Current therapy:   Lupron 30 mg every 4 months.   Xgeva 120 mg every 4 weeks to start on March 29, 2018.     Interim History:  Alejandro [name] returns today for a follow-up visit.  Since last visit, he returned to [place] for repeat evaluation prior to subsequent cycles of treatment and was hospitalized after developing neurological symptoms including dizziness and lightheadedness.  Imaging studies showed base of the skull metastasis that is causing neurological symptoms.  He was treated with radiation to the base of the skull as well as  the spine for palliative purposes.  He is currently on tapering doses of steroids.  Since he was discharged, he traveled back to [place] although hospitalized briefly on 01/13/2019 after presenting with symptoms of chest tightness and found to have atrial fibrillation with rapid ventricular response.  He was discharged on rate control after refusing cardioversion.  He feels reasonably fair at this time.  He is ambulating without any major difficulties.  His dizziness and back pain has improved.  He denies any chest tightness or shortness of breath.  He does report slight palpitation.   He denied any alteration mental status, neuropathy, confusion or dizziness.  Denies any headaches or lethargy.  Denies any night sweats, weight loss or changes in appetite.  Denied orthopnea, dyspnea on exertion or chest discomfort.  Denies shortness of breath, difficulty breathing hemoptysis or cough.  Denies any abdominal distention, nausea, early satiety or dyspepsia.  Denies any hematuria, frequency, dysuria or nocturia.  Denies any skin irritation, dryness or rash.  Denies any ecchymosis or petechiae.  Denies any lymphadenopathy or clotting.  Denies any heat or cold intolerance.  Denies any anxiety or depression.  Remaining review of system is negative.     Medications: I have reviewed the patient's current medications.  Current Outpatient Medications  Medication Sig Dispense Refill  . acetaminophen (TYLENOL) 325 MG tablet Take 650 mg by mouth every 6 (six) hours as needed for moderate pain.    Marland Kitchen amitriptyline (ELAVIL) 10 MG tablet Take 20 mg by mouth  at bedtime.     . calcium-vitamin D (OSCAL WITH D) 500-200 MG-UNIT tablet Take 1 tablet by mouth 2 (two) times daily. 90 tablet 3  . cholecalciferol (VITAMIN D3) 25 MCG (1000 UT) tablet Take 1,000 Units by mouth daily.    Marland Kitchen dexamethasone (DECADRON) 1 MG tablet Take 2 mg by mouth See admin instructions. Patient needs 4mg  qhs tonight, 01/13/19    . diltiazem  (CARDIZEM CD) 240 MG 24 hr capsule Take 1 capsule (240 mg total) by mouth daily. 30 capsule 3  . diltiazem (CARDIZEM) 60 MG tablet Take 0.5 tablets (30 mg total) by mouth 3 (three) times daily as needed (For HR > 120/min). 30 tablet 2  . ezetimibe (ZETIA) 10 MG tablet Take 10 mg by mouth daily.    . fluticasone (FLONASE) 50 MCG/ACT nasal spray Place 1-2 sprays into both nostrils daily as needed for allergies.     Marland Kitchen ibuprofen (ADVIL) 600 MG tablet Take 600 mg by mouth every 6 (six) hours as needed for moderate pain.    Marland Kitchen loratadine (CLARITIN) 10 MG tablet Take 10 mg by mouth daily.    Marland Kitchen losartan (COZAAR) 50 MG tablet Take 50 mg by mouth daily.     Marland Kitchen morphine (MS CONTIN) 15 MG 12 hr tablet Take 15 mg by mouth 2 (two) times daily as needed for pain.    . tamsulosin (FLOMAX) 0.4 MG CAPS capsule TAKE 1 CAPSULE BY MOUTH TWICE DAILY 60 capsule 0  . TURMERIC PO Take 1 tablet by mouth daily.     No current facility-administered medications for this visit.      Allergies:  Allergies  Allergen Reactions  . Codeine Nausea Only    Past Medical History, Surgical history, Social history, and Family History were reviewed and updated.   Physical Exam:   Blood pressure 118/90, pulse 93, temperature 98.2 F (36.8 C), temperature source Oral, resp. rate 18, height 5\' 10"  (1.778 m), weight 223 lb 14.4 oz (101.6 kg), SpO2 98 %.   ECOG: 1    General appearance: Comfortable appearing without any discomfort Head: Normocephalic without any trauma Oropharynx: Mucous membranes are moist and pink without any thrush or ulcers. Eyes: Pupils are equal and round reactive to light. Lymph nodes: No cervical, supraclavicular, inguinal or axillary lymphadenopathy.   Heart: Regular without any murmurs.  No leg edema. Lung: Clear without any rhonchi or wheezes.  No dullness to percussion. Abdomin: Soft, nontender, nondistended with good bowel sounds.  No hepatosplenomegaly. Musculoskeletal: No joint deformity or  effusion.  Full range of motion noted. Neurological: No deficits noted on motor, sensory and deep tendon reflex exam. Skin: No petechial rash or dryness.  Appeared moist.         Lab Results: Lab Results  Component Value Date   WBC 7.9 01/13/2019   HGB 13.4 01/13/2019   HCT 40.8 01/13/2019   MCV 96.2 01/13/2019   PLT 216 01/13/2019     Chemistry      Component Value Date/Time   NA 132 (L) 01/13/2019 1941   NA 135 (L) 06/13/2014 0944   K 4.4 01/13/2019 1941   K 5.4 (H) 06/13/2014 0944   CL 98 01/13/2019 1941   CO2 20 (L) 01/13/2019 1941   CO2 25 06/13/2014 0944   BUN 34 (H) 01/13/2019 1941   BUN 18.8 06/13/2014 0944   CREATININE 0.91 01/13/2019 2309   CREATININE 0.93 12/22/2018 1317   CREATININE 1.0 06/13/2014 0944      Component Value Date/Time  CALCIUM 8.6 (L) 01/13/2019 1941   CALCIUM 10.0 06/13/2014 0944   ALKPHOS 80 12/22/2018 1317   ALKPHOS 96 06/13/2014 0944   AST 30 12/22/2018 1317   AST 104 (H) 06/13/2014 0944   ALT 23 12/22/2018 1317   ALT 126 (H) 06/13/2014 0944   BILITOT 0.4 12/22/2018 1317   BILITOT 0.63 06/13/2014 0944       Results for RONOLD, HARDGROVE (MRN [id number]) as of 12/28/2018 14:57  Ref. 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    The natural course of this disease was discussed today and treatment options were reviewed.  He has progressed on multiple therapies outlined above although he has not received Jevtana chemotherapy.  Options of therapy would include treatment with systemic chemotherapy versus obtaining off label treatment if he has targetable mutation with next generation sequencing.  He had some of this analysis done in [place] and we will obtain these records.  In the meantime, I recommended he  stays off treatment given his recent events.   2.  Bone directed therapy: He received Xgeva on Dec 22, 2018 and that will be repeated every 6 to 8 weeks.  Complications occluding osteonecrosis of the jaw and hypocalcemia were reiterated.  3.  Androgen depravation: I recommended continuing this indefinitely.  He received Lupron on Dec 30, 2018.  4.  Spinal metastasis: He is status post radiation therapy and currently on tapering doses of steroids.  5.  Atrial fibrillation: He has follow-up with Dr. Einar Gip.  I recommended he consider cardioversion if it is offered to him.  4. Follow-up: Will be the next few weeks to follow his progress.  25 minutes was spent with the patient face-to-face today.  More than 50% of time was dedicated to reviewing his disease status, treatment options and future complications related therapy.       Zola Button, MD 6/15/20209:34 AM

## 2019-01-17 NOTE — Telephone Encounter (Signed)
Scheduled appt per sch msg. Called and spoke with patient. Confirmed date and time °

## 2019-01-18 ENCOUNTER — Telehealth: Payer: BC Managed Care – PPO

## 2019-01-18 DIAGNOSIS — I4819 Other persistent atrial fibrillation: Secondary | ICD-10-CM

## 2019-01-18 LAB — PROSTATE-SPECIFIC AG, SERUM (LABCORP): Prostate Specific Ag, Serum: 290 ng/mL — ABNORMAL HIGH (ref 0.0–4.0)

## 2019-01-18 NOTE — Telephone Encounter (Signed)
-----   Message from Adrian Prows, MD sent at 01/14/2019  5:28 PM EDT ----- Regarding: Needs TOC and VV in 10 days New Atrial fibrillation. Diltiazem CD 240 mg Rx sent not in the AVS. He should be taking this. I spoke to his this afternoon

## 2019-01-18 NOTE — Telephone Encounter (Signed)
Location of hospitalization: [place] Reason for hospitalization: afib Date of discharge: 01/14/19 Date of first communication with patient: 01/18/19 Person contacting patient: Alejandro [name] b Current symptoms: sitting to standing unstable, feel terrible today, HR 122 getting out of bed, took morphine for chest pain, did fall this morning on the floor.  Do you understand why you were in the Hospital: Yes Questions regarding discharge instructions: None Where were you discharged to: Home Medications reviewed: Yes Allergies reviewed: Yes Dietary changes reviewed: no. Discussed low fat and low salt diet.  Referals reviewed: NA Activities of Daily Living: Able to with mild limitations, no driving Any transportation issues/concerns: None Any patient concerns: None Confirmed importance & date/time of Follow up appt: Yes 01/21/19 Confirmed with patient if condition begins to worsen call. Pt was given the office number and encouraged to call back with questions or concerns: Yes

## 2019-01-21 ENCOUNTER — Other Ambulatory Visit: Payer: Self-pay

## 2019-01-21 ENCOUNTER — Encounter: Payer: Self-pay | Admitting: Cardiology

## 2019-01-21 ENCOUNTER — Ambulatory Visit: Payer: BC Managed Care – PPO | Admitting: Cardiology

## 2019-01-21 VITALS — BP 135/95 | Ht 70.0 in | Wt 222.0 lb

## 2019-01-21 DIAGNOSIS — I4819 Other persistent atrial fibrillation: Secondary | ICD-10-CM | POA: Diagnosis not present

## 2019-01-21 DIAGNOSIS — I10 Essential (primary) hypertension: Secondary | ICD-10-CM | POA: Diagnosis not present

## 2019-01-21 DIAGNOSIS — I25118 Atherosclerotic heart disease of native coronary artery with other forms of angina pectoris: Secondary | ICD-10-CM | POA: Diagnosis not present

## 2019-01-21 DIAGNOSIS — E7800 Pure hypercholesterolemia, unspecified: Secondary | ICD-10-CM | POA: Diagnosis not present

## 2019-01-21 MED ORDER — METOPROLOL TARTRATE 50 MG PO TABS: 50.0000 mg | ORAL_TABLET | Freq: Two times a day (BID) | ORAL | 2 refills | Status: DC

## 2019-01-22 ENCOUNTER — Encounter: Payer: Self-pay | Admitting: Cardiology

## 2019-01-22 NOTE — Progress Notes (Signed)
Virtual Visit via Video Note: This visit type was conducted due to national recommendations for restrictions regarding the COVID-19 Pandemic (e.g. social distancing).  This format is felt to be most appropriate for this patient at this time.  All issues noted in this document were discussed and addressed.  No physical exam was performed (except for noted visual exam findings with Telehealth visits).  The patient has consented to conduct a Telehealth visit and understands insurance will be billed.   I connected with@, on 01/21/19 at  by a video enabled telemedicine application and verified that I am speaking with the correct person using two identifiers.   I discussed the limitations of evaluation and management by telemedicine and the availability of in person appointments. The patient expressed understanding and agreed to proceed.   I have discussed with patient regarding the safety during COVID Pandemic and steps and precautions to be taken including social distancing, frequent hand wash and use of detergent soap, gels with the patient. I asked the patient to avoid touching mouth, nose, eyes, ears with the hands. I encouraged regular walking around the neighborhood and exercise and regular diet, as long as social distancing can be maintained.  Primary Physician/Referring:  [name], MD  Patient ID: Alejandro Ready., male    DOB: [birth year], 63 y.o.   MRN: [id number]  Chief Complaint  Patient presents with  . Hypertension  . Shortness of Breath  . Atrial Fibrillation    HPI: Alejandro Jonn Chaikin.  is a 63 y.o. male  with  coronary atherosclerosis, coronary angiography in 10/13/2007 had revealed occluded RCA. He had mild disease diffusely in the other vessels, dyslipidemia, hypertension, OSA on CPAP and moderate obesity as well as an abdominal aortic atherosclerosis with severe calcification. He also has chronic orthostatic hypotension and supine hypertension.   Patient with  fairly aggressive prostate cancer, he is being treated at [facility] in [place] and locally follows Dr. Kathie Rhodes, now felt to be treated for palliative purposes only.  He presented to the emergency room with chest pain, shortness of breath, palpitations and dizziness and found to be in atrial fibrillation with rapid ventricular response.  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  Patient states that he is not doing well, continues to have elevated blood pressure and heart rate, feels extremely dizzy and still has chest discomfort in the heart rate goes up and is extremely nervous and anxious. Past Medical History:  Diagnosis Date  . ANXIETY   . AORTIC STENOSIS, MILD   . CHEST PAIN-UNSPECIFIED   . DYSPNEA   . HYPERCHOLESTEROLEMIA   . Hypertension   . INSOMNIA   . OSA on CPAP 05/31/2013  . OSTEOARTHRITIS, KNEE   . Prostate cancer [facility])     Past Surgical History:  Procedure Laterality Date  . COLONOSCOPY    . ELBOW SURGERY    . LUMBAR LAMINECTOMY  Late 1990's  . NEUROPLASTY / TRANSPOSITION MEDIAN NERVE AT CARPAL TUNNEL BILATERAL    . PROSTATE BIOPSY      Social History   Socioeconomic History  . Marital status: Divorced    Spouse name: Not on file  . Number of children: 1  . Years of education: college  . Highest education level: Not on file  Occupational  History  . Occupation: CONSULTANT    Employer: [employer]  Social Needs  . Financial resource strain: Not on file  . Food insecurity    Worry: Not on file    Inability: Not on file  . Transportation needs    Medical: Not on file    Non-medical: Not on file  Tobacco Use  . Smoking status: Former Smoker    Packs/day: 3.00    Years: 25.00    Pack years: 75.00     Types: Cigars, Cigarettes    Quit date: 12/03/1998    Years since quitting: 20.1  . Smokeless tobacco: Never Used  . Tobacco comment: Former 3 ppd smoker for 30 years quit heavy smoking 15 years ago  Substance and Sexual Activity  . Alcohol use: Yes    Alcohol/week: 12.0 standard drinks    Types: 12 Standard drinks or equivalent per week    Comment: 2 drinks per day  . Drug use: No  . Sexual activity: Yes  Lifestyle  . Physical activity    Days per week: Not on file    Minutes per session: Not on file  . Stress: Not on file  Relationships  . Social Herbalist on phone: Not on file    Gets together: Not on file    Attends religious service: Not on file    Active member of club or organization: Not on file    Attends meetings of clubs or organizations: Not on file    Relationship status: Not on file  . Intimate partner violence    Fear of current or ex partner: Not on file    Emotionally abused: Not on file    Physically abused: Not on file    Forced sexual activity: Not on file  Other Topics Concern  . Not on file  Social History Narrative  . Not on file    Current Outpatient Medications on File Prior to Visit  Medication Sig Dispense Refill  . acetaminophen (TYLENOL) 325 MG tablet Take 650 mg by mouth every 6 (six) hours as needed for moderate pain.    Marland Kitchen amitriptyline (ELAVIL) 10 MG tablet Take 20 mg by mouth at bedtime.     . calcium-vitamin D (OSCAL WITH D) 500-200 MG-UNIT tablet Take 1 tablet by mouth 2 (two) times daily. 90 tablet 3  . cholecalciferol (VITAMIN D3) 25 MCG (1000 UT) tablet Take 1,000 Units by mouth daily.    Marland Kitchen dexamethasone (DECADRON) 1 MG tablet Take 2 mg by mouth See admin instructions. Patient needs 4mg  qhs tonight, 01/13/19    . diltiazem (CARDIZEM CD) 240 MG 24 hr capsule Take 1 capsule (240 mg total) by mouth daily. 30 capsule 3  . diltiazem (CARDIZEM) 60 MG tablet Take 0.5 tablets (30 mg total) by mouth 3 (three) times daily as needed (For  HR > 120/min). 30 tablet 2  . ezetimibe (ZETIA) 10 MG tablet Take 10 mg by mouth daily.    . fluticasone (FLONASE) 50 MCG/ACT nasal spray Place 1-2 sprays into both nostrils daily as needed for allergies.     Marland Kitchen ibuprofen (ADVIL) 600 MG tablet Take 600 mg by mouth every 6 (six) hours as needed for moderate pain.    Marland Kitchen loratadine (CLARITIN) 10 MG tablet Take 10 mg by mouth daily.    Marland Kitchen losartan (COZAAR) 50 MG tablet Take 50 mg by mouth daily.     Marland Kitchen morphine (MS CONTIN) 15 MG 12 hr tablet Take 15  mg by mouth 2 (two) times daily as needed for pain.    . tamsulosin (FLOMAX) 0.4 MG CAPS capsule TAKE 1 CAPSULE BY MOUTH TWICE DAILY 60 capsule 0  . TURMERIC PO Take 1 tablet by mouth daily.     No current facility-administered medications on file prior to visit.     Review of Systems  Constitution: Positive for malaise/fatigue and weight loss (50 Lbs and intentional). Negative for chills, decreased appetite and weight gain.  Cardiovascular: Positive for dyspnea on exertion (worsening). Negative for leg swelling and syncope.  Endocrine: Negative for cold intolerance.  Hematologic/Lymphatic: Does not bruise/bleed easily.  Musculoskeletal: Negative for joint swelling.  Gastrointestinal: Negative for abdominal pain, anorexia and change in bowel habit.  Genitourinary: Positive for decreased libido and frequency.  Neurological: Positive for dizziness. Negative for headaches and light-headedness.  Psychiatric/Behavioral: Positive for depression. Negative for substance abuse. The patient is nervous/anxious.   All other systems reviewed and are negative.     Objective  Blood pressure (!) 135/95, height 5\' 10"  (1.778 m), weight 222 lb (100.7 kg). Body mass index is 31.85 kg/m.  Physical exam not performed or limited due to virtual visit.  Patient appeared to be in no distress, Neck was supple, respiration was not labored.  Please see exam details from prior visit is as below.   Physical Exam   Constitutional: He appears well-developed. No distress.  Mildly obese  HENT:  Head: Atraumatic.  Eyes: Conjunctivae are normal.  Neck: Neck supple. No JVD present. No thyromegaly present.  Cardiovascular: Intact distal pulses and normal pulses. An irregularly irregular rhythm present. Exam reveals no gallop, no S3 and no S4.  No murmur heard. S1 is variable, S2 is normal.   Pulmonary/Chest: Effort normal and breath sounds normal.  Abdominal: Soft. Bowel sounds are normal.  Musculoskeletal: Normal range of motion.        General: No edema.  Neurological: He is alert.  Skin: Skin is warm and dry.  Psychiatric: He has a normal mood and affect.   Radiology: No results found.  Laboratory examination:   CMP Latest Ref Rng & Units 01/17/2019 01/13/2019 01/13/2019  Glucose 70 - 99 mg/dL 156(H) - 184(H)  BUN 8 - 23 mg/dL 40(H) - 34(H)  Creatinine 0.61 - 1.24 mg/dL 0.94 0.91 0.88  Sodium 135 - 145 mmol/L 131(L) - 132(L)  Potassium 3.5 - 5.1 mmol/L 4.9 - 4.4  Chloride 98 - 111 mmol/L 98 - 98  CO2 22 - 32 mmol/L 20(L) - 20(L)  Calcium 8.9 - 10.3 mg/dL 8.4(L) - 8.6(L)  Total Protein 6.5 - 8.1 g/dL 6.2(L) - -  Total Bilirubin 0.3 - 1.2 mg/dL 1.0 - -  Alkaline Phos 38 - 126 U/L 47 - -  AST 15 - 41 U/L 37 - -  ALT 0 - 44 U/L 109(H) - -   CBC Latest Ref Rng & Units 01/17/2019 01/13/2019 01/13/2019  WBC 4.0 - 10.5 K/uL 7.5 7.9 10.0  Hemoglobin 13.0 - 17.0 g/dL 13.6 13.4 15.1  Hematocrit 39.0 - 52.0 % 41.7 40.8 47.6  Platelets 150 - 400 K/uL 170 216 256   Lipid Panel  No results found for: CHOL, TRIG, HDL, CHOLHDL, VLDL, LDLCALC, LDLDIRECT HEMOGLOBIN A1C No results found for: HGBA1C, MPG TSH Recent Labs    01/13/19 1955  TSH 0.550    Cardiac Studies:   Coronary angiogram 10/13/2007: Mild diffuse CAD involving LAD and circumflex coronary artery.  RCA occluded in the proximal segment, with left-to-right and right-to-left  right region collaterals.  Ejection fraction 55% with mild  inferobasal hypokinesis.  Mild aortic stenosis.  Lexiscan sestamibi stress test 03/17/2014: 1. Resting EKG demonstrated normal sinus rhythm. Initially patient exercised for 8 minutes and 35 seconds on a Bruce protocol and achieved 13.4 METs. However patient achieved only 73% of age-predicted maximum heart rate, hence stress test had to be terminated and switched over to [place] sestamibi stress test. There is no ST-T wave changes for ischemia with exercise stress test, Lexiscan stress EKG revealed no ST-T wave changes ischemia. 2. The quality of the stress images was good. There is minimal inferior wall diaphragmatic attenuation artifact without evidence of ischemia. Left ventricle systolic function Related by QGS was 64%. The left ventricle was normal in size both in rest and stress images. This is a low risk study.  Echocardiogram [12/03/2016]: Left ventricle cavity is normal in size. Mild concentric hypertrophy of the left ventricle. Normal global wall motion. Normal diastolic filling pattern. Calculated EF 66%. Left atrial cavity is moderately dilated at 4.7 cm. Mild aortic valve leaflet thickening with mild calcification. No evidence of aortic valve stenosis. Mild to moderate aortic regurgitation. Mild (Grade I) mitral regurgitation. Mild tricuspid regurgitation. Mild pulmonary hypertension. Pulmonary artery systolic pressure is estimated at 34 mm Hg. The aortic root is mildly dilated at 4.0 cm. Compared to the study done on 03/30/2014, previously aortic regurgitation was very mild. Pulmonary hypertension is new.   Assessment   Other persistent atrial fibrillation - CHA2DS2-VASc Score is 2.  - Plan: metoprolol tartrate (LOPRESSOR) 50 MG tablet  Essential hypertension - Plan: metoprolol tartrate (LOPRESSOR) 50 MG tablet  Coronary artery disease of native artery of native heart with stable angina pectoris ([place])   Hypercholesteremia   EKG 01/13/2019: Atrial fibrillation with rapid  ventricular response at the rate of 160 bpm, LVH with repolarization abnormality, cannot exclude inferior and lateral ischemia.  EKG 11/27/2017: Sinus bradycardia at 50 bpm, left atrial enlargement, normal axis, LVH.  No evidence of ischemia.  One PAC.  Recommendations:   Patient now has advanced prostate cancer with metastatic disease, due to intracranial bleed that was noted recently after metastases to the skull, he was not started on anticoagulation.  I will sent a message to his oncologist whether anticoagulation could be started even if it is short term.  With regard to atrial fibrillation, rate continues to be uncontrolled.  Will increase metoprolol tartrate 25 mg to 50 mg b.i.d., continue diltiazem, previously patient has had underlying sinus bradycardia and could only tolerate Bystolic.  His symptoms of angina was stable until atrial fibrillation, suspect demand ischemia.  He also has hyperlipidemia and could not tolerate statins may need PCSK 9 inhibitors however his long-term therapy with regard to CAD and cardiac issues had to be decided upon his response and prognosis with regard to prostate cancer.   I will see him in the office in 10 days for rate control and hyptertension. This is a TOC visit < < 7 days  Adrian Prows, MD, [facility] 01/23/2019, [facility]. [place] Pager: [phone] Office: [phone] If no answer Cell [phone]

## 2019-01-23 ENCOUNTER — Other Ambulatory Visit: Payer: Self-pay | Admitting: Oncology

## 2019-01-26 ENCOUNTER — Telehealth: Payer: Self-pay

## 2019-01-26 ENCOUNTER — Other Ambulatory Visit: Payer: Self-pay

## 2019-01-26 MED ORDER — MAGIC MOUTHWASH W/LIDOCAINE: 5.0000 mL | Freq: Four times a day (QID) | ORAL | 0 refills | Status: DC | PRN

## 2019-01-26 NOTE — Telephone Encounter (Signed)
-----   Message from Wyatt Portela, MD sent at 01/26/2019  9:12 AM EDT ----- Please send Rx for him. Thanks. ----- Message ----- From: Tami Lin, RN Sent: 01/26/2019   8:40 AM EDT To: Wyatt Portela, MD  Patient called and stated he had head/neck radiation treatment at [facility] in [place] last week. He is complaining of gum and mouth soreness. Denies any other complaints. He wants to know if he can get a prescription for magic mouthwash. Lanelle Bal

## 2019-01-26 NOTE — Telephone Encounter (Signed)
Prescription called in to [facility].

## 2019-01-31 ENCOUNTER — Telehealth: Payer: Self-pay

## 2019-01-31 NOTE — Telephone Encounter (Signed)
Pt of Alejandro [name] called stating that he woke up and his HR was 120 then jumped up to 131. He took his regular daily dose of cardizem 240 and then his prn dose of 60 he took a whole tab instead of 1/2. Please advise.

## 2019-01-31 NOTE — Telephone Encounter (Signed)
How is he feeling now with doing that?

## 2019-01-31 NOTE — Telephone Encounter (Signed)
Pt states that HR has dropped down to 90's. Told him he could take 1/2 if it went back up but not more than that due to hitting max dose. He will cb if any problems.//ah

## 2019-02-01 ENCOUNTER — Other Ambulatory Visit: Payer: Self-pay

## 2019-02-01 ENCOUNTER — Encounter: Payer: Self-pay | Admitting: Cardiology

## 2019-02-01 ENCOUNTER — Ambulatory Visit (INDEPENDENT_AMBULATORY_CARE_PROVIDER_SITE_OTHER): Payer: BC Managed Care – PPO | Admitting: Cardiology

## 2019-02-01 VITALS — BP 110/72 | HR 68 | Ht 70.0 in | Wt 230.0 lb

## 2019-02-01 DIAGNOSIS — I10 Essential (primary) hypertension: Secondary | ICD-10-CM | POA: Diagnosis not present

## 2019-02-01 DIAGNOSIS — E7800 Pure hypercholesterolemia, unspecified: Secondary | ICD-10-CM

## 2019-02-01 DIAGNOSIS — I4819 Other persistent atrial fibrillation: Secondary | ICD-10-CM | POA: Diagnosis not present

## 2019-02-01 DIAGNOSIS — I25118 Atherosclerotic heart disease of native coronary artery with other forms of angina pectoris: Secondary | ICD-10-CM | POA: Diagnosis not present

## 2019-02-01 DIAGNOSIS — R42 Dizziness and giddiness: Secondary | ICD-10-CM | POA: Diagnosis not present

## 2019-02-01 MED ORDER — METOPROLOL TARTRATE 50 MG PO TABS: 50.0000 mg | ORAL_TABLET | Freq: Two times a day (BID) | ORAL | 2 refills | Status: DC

## 2019-02-01 NOTE — Progress Notes (Signed)
EKG 02/01/2019: Atrial fibrillation at 66 bpm, normal axis, LVH, no evidence of ischemia. Previously documented in error

## 2019-02-01 NOTE — Progress Notes (Signed)
Primary Physician/Referring:  [name], MD  Patient ID: Alejandro [name]., male    DOB: 20-Feb-1956, 63 y.o.   MRN: [id number]  Chief Complaint  Patient presents with  . Atrial Fibrillation    10 days f/u, pt has not taken Metoprolol     HPI: Alejandro [name].  is a 63 y.o. male  with  coronary atherosclerosis, coronary angiography in 10/13/2007 had revealed occluded RCA. He had mild disease diffusely in the other vessels, dyslipidemia, hypertension, OSA on CPAP and moderate obesity as well as an abdominal aortic atherosclerosis with severe calcification. He also has chronic orthostatic hypotension and supine hypertension.   Patient with fairly aggressive prostate cancer, he is being treated at [facility] in [place] and locally follows Dr. Kathie Rhodes, now felt to be treated for palliative purposes only.  He presented to the emergency room with chest pain, shortness of breath, palpitations and dizziness and found to be in atrial fibrillation with rapid ventricular response.  Due to recent metastatic disease to his skull and intracranial bleeding, felt not to be a candidate for anticoagulation. he was started on sotalol to see whether he would convert back to sinus rhythm, remained in atrial fibrillation, discharged home on diltiazem.  Patient continues to state that he is not doing well, continues to have fast heart rate, feels extremely dizzy and still has chest discomfort when the heart rate goes up and is extremely nervous and anxious. Heart rate elevation is mostly in the morning. He is now on 240 mg Diltiazem with 30 mg up to 3 times a day as needed for high heart rate. He states that he is having to use the as needed doses regularly. Also states that the new cancer medication that his oncologist wants to try will require him to stop diltiazem due to interaction. He was thought to be on Metoprolol at his last office visit and dose was  increased to 50 mg; however, patient states that he has never been on this medication.   He does have some dizziness with going from sitting to standing. He is now wearing support stockings that are helping some. BP has mostly been in the 379-024 range systolic and 09-73 range diastolic.  Past Medical History:  Diagnosis Date  . ANXIETY   . AORTIC STENOSIS, MILD   . CHEST PAIN-UNSPECIFIED   . DYSPNEA   . HYPERCHOLESTEROLEMIA   . Hypertension   . INSOMNIA   . OSA on CPAP 05/31/2013  . OSTEOARTHRITIS, KNEE   . Prostate cancer [facility])     Past Surgical History:  Procedure Laterality Date  . COLONOSCOPY    . ELBOW SURGERY    . LUMBAR LAMINECTOMY  Late 1990's  . NEUROPLASTY / TRANSPOSITION MEDIAN NERVE AT CARPAL TUNNEL BILATERAL    . PROSTATE BIOPSY      Social History   Socioeconomic History  . Marital status: Divorced    Spouse name: Not on file  . Number of children: 1  . Years of education: college  . Highest education level: Not on file  Occupational History  . Occupation: CONSULTANT    Employer: [employer]  Social Needs  . Financial resource strain: Not on file  . Food insecurity    Worry: Not on file    Inability: Not on file  . Transportation needs    Medical: Not on file    Non-medical: Not on file  Tobacco Use  . Smoking status: Former Smoker  Packs/day: 3.00    Years: 25.00    Pack years: 75.00    Types: Cigars, Cigarettes    Quit date: 12/03/1998    Years since quitting: 20.1  . Smokeless tobacco: Never Used  . Tobacco comment: Former 3 ppd smoker for 30 years quit heavy smoking 15 years ago  Substance and Sexual Activity  . Alcohol use: Yes    Alcohol/week: 12.0 standard drinks    Types: 12 Standard drinks or equivalent per week    Comment: 2 drinks per day  . Drug use: No  . Sexual activity: Yes  Lifestyle  . Physical activity    Days per week: Not on file    Minutes per session: Not on file  . Stress: Not on file  Relationships  .  Social Herbalist on phone: Not on file    Gets together: Not on file    Attends religious service: Not on file    Active member of club or organization: Not on file    Attends meetings of clubs or organizations: Not on file    Relationship status: Not on file  . Intimate partner violence    Fear of current or ex partner: Not on file    Emotionally abused: Not on file    Physically abused: Not on file    Forced sexual activity: Not on file  Other Topics Concern  . Not on file  Social History Narrative  . Not on file    Current Outpatient Medications on File Prior to Visit  Medication Sig Dispense Refill  . acetaminophen (TYLENOL) 325 MG tablet Take 650 mg by mouth every 6 (six) hours as needed for moderate pain.    Alejandro [name] Kitchen amitriptyline (ELAVIL) 10 MG tablet Take 20 mg by mouth at bedtime.     . calcium-vitamin D (OSCAL WITH D) 500-200 MG-UNIT tablet Take 1 tablet by mouth 2 (two) times daily. 90 tablet 3  . cholecalciferol (VITAMIN D3) 25 MCG (1000 UT) tablet Take 1,000 Units by mouth daily.    Alejandro [name] Kitchen diltiazem (CARDIZEM CD) 240 MG 24 hr capsule Take 1 capsule (240 mg total) by mouth daily. 30 capsule 3  . diltiazem (CARDIZEM) 60 MG tablet Take 0.5 tablets (30 mg total) by mouth 3 (three) times daily as needed (For HR > 120/min). 30 tablet 2  . ezetimibe (ZETIA) 10 MG tablet Take 10 mg by mouth daily.    . fluticasone (FLONASE) 50 MCG/ACT nasal spray Place 1-2 sprays into both nostrils daily as needed for allergies.     Alejandro [name] Kitchen ibuprofen (ADVIL) 600 MG tablet Take 600 mg by mouth every 6 (six) hours as needed for moderate pain.    Alejandro [name] Kitchen losartan (COZAAR) 50 MG tablet Take 50 mg by mouth daily.     . magic mouthwash w/lidocaine SOLN Take 5 mLs by mouth 4 (four) times daily as needed for mouth pain. 240 mL 0  . morphine (MS CONTIN) 15 MG 12 hr tablet Take 15 mg by mouth 2 (two) times daily as needed for pain.    . NON FORMULARY 1 gram salt tablet    . NON FORMULARY Pt on antiobiotic for 1 more  week doesn't know the name    . tamsulosin (FLOMAX) 0.4 MG CAPS capsule TAKE 1 CAPSULE BY MOUTH TWICE DAILY 60 capsule 0  . TURMERIC PO Take 1 tablet by mouth daily.    Alejandro [name] Kitchen loratadine (CLARITIN) 10 MG tablet Take 10 mg by mouth daily.    Alejandro [name] Kitchen  metoprolol tartrate (LOPRESSOR) 50 MG tablet Take 1 tablet (50 mg total) by mouth 2 (two) times daily. (Patient not taking: Reported on 02/01/2019) 60 tablet 2   No current facility-administered medications on file prior to visit.     Review of Systems  Constitution: Positive for malaise/fatigue and weight loss (50 Lbs and intentional). Negative for chills, decreased appetite and weight gain.  Cardiovascular: Positive for dyspnea on exertion (worsening). Negative for leg swelling and syncope.  Endocrine: Negative for cold intolerance.  Hematologic/Lymphatic: Does not bruise/bleed easily.  Musculoskeletal: Negative for joint swelling.  Gastrointestinal: Negative for abdominal pain, anorexia and change in bowel habit.  Genitourinary: Positive for decreased libido and frequency.  Neurological: Positive for dizziness. Negative for headaches and light-headedness.  Psychiatric/Behavioral: Positive for depression. Negative for substance abuse. The patient is nervous/anxious.   All other systems reviewed and are negative.     Objective  Blood pressure 97/66, pulse 68, height 5\' 10"  (1.778 m), weight 230 lb (104.3 kg), SpO2 98 %. Body mass index is 33 kg/m.  Physical exam not performed or limited due to virtual visit.  Patient appeared to be in no distress, Neck was supple, respiration was not labored.  Please see exam details from prior visit is as below.   Physical Exam  Constitutional: He appears well-developed. No distress.  Mildly obese  HENT:  Head: Atraumatic.  Eyes: Conjunctivae are normal.  Neck: Neck supple. No JVD present. No thyromegaly present.  Cardiovascular: Intact distal pulses and normal pulses. An irregularly irregular rhythm present. Exam  reveals no gallop, no S3 and no S4.  No murmur heard. S1 is variable, S2 is normal.   Pulmonary/Chest: Effort normal and breath sounds normal.  Abdominal: Soft. Bowel sounds are normal.  Musculoskeletal: Normal range of motion.        General: No edema.  Neurological: He is alert.  Skin: Skin is warm and dry.  Psychiatric: He has a normal mood and affect.   Radiology: No results found.  Laboratory examination:   CMP Latest Ref Rng & Units 01/17/2019 01/13/2019 01/13/2019  Glucose 70 - 99 mg/dL 156(H) - 184(H)  BUN 8 - 23 mg/dL 40(H) - 34(H)  Creatinine 0.61 - 1.24 mg/dL 0.94 0.91 0.88  Sodium 135 - 145 mmol/L 131(L) - 132(L)  Potassium 3.5 - 5.1 mmol/L 4.9 - 4.4  Chloride 98 - 111 mmol/L 98 - 98  CO2 22 - 32 mmol/L 20(L) - 20(L)  Calcium 8.9 - 10.3 mg/dL 8.4(L) - 8.6(L)  Total Protein 6.5 - 8.1 g/dL 6.2(L) - -  Total Bilirubin 0.3 - 1.2 mg/dL 1.0 - -  Alkaline Phos 38 - 126 U/L 47 - -  AST 15 - 41 U/L 37 - -  ALT 0 - 44 U/L 109(H) - -   CBC Latest Ref Rng & Units 01/17/2019 01/13/2019 01/13/2019  WBC 4.0 - 10.5 K/uL 7.5 7.9 10.0  Hemoglobin 13.0 - 17.0 g/dL 13.6 13.4 15.1  Hematocrit 39.0 - 52.0 % 41.7 40.8 47.6  Platelets 150 - 400 K/uL 170 216 256   Lipid Panel  No results found for: CHOL, TRIG, HDL, CHOLHDL, VLDL, LDLCALC, LDLDIRECT HEMOGLOBIN A1C No results found for: HGBA1C, MPG TSH Recent Labs    01/13/19 1955  TSH 0.550    Cardiac Studies:   Coronary angiogram 10/13/2007: Mild diffuse CAD involving LAD and circumflex coronary artery.  RCA occluded in the proximal segment, with left-to-right and right-to-left right region collaterals.  Ejection fraction 55% with mild inferobasal hypokinesis.  Mild aortic stenosis.  Lexiscan sestamibi stress test 03/17/2014: 1. Resting EKG demonstrated normal sinus rhythm. Initially patient exercised for 8 minutes and 35 seconds on a Bruce protocol and achieved 13.4 METs. However patient achieved only 73% of age-predicted maximum  heart rate, hence stress test had to be terminated and switched over to [place] sestamibi stress test. There is no ST-T wave changes for ischemia with exercise stress test, Lexiscan stress EKG revealed no ST-T wave changes ischemia. 2. The quality of the stress images was good. There is minimal inferior wall diaphragmatic attenuation artifact without evidence of ischemia. Left ventricle systolic function Related by QGS was 64%. The left ventricle was normal in size both in rest and stress images. This is a low risk study.  Echocardiogram [12/03/2016]: Left ventricle cavity is normal in size. Mild concentric hypertrophy of the left ventricle. Normal global wall motion. Normal diastolic filling pattern. Calculated EF 66%. Left atrial cavity is moderately dilated at 4.7 cm. Mild aortic valve leaflet thickening with mild calcification. No evidence of aortic valve stenosis. Mild to moderate aortic regurgitation. Mild (Grade I) mitral regurgitation. Mild tricuspid regurgitation. Mild pulmonary hypertension. Pulmonary artery systolic pressure is estimated at 34 mm Hg. The aortic root is mildly dilated at 4.0 cm. Compared to the study done on 03/30/2014, previously aortic regurgitation was very mild. Pulmonary hypertension is new.   Assessment     ICD-10-CM   1. Persistent atrial fibrillation  I48.19 EKG 12-Lead  2. Coronary artery disease of native artery of native heart with stable angina pectoris ([place])  I25.118   3. Essential hypertension  I10 metoprolol tartrate (LOPRESSOR) 50 MG tablet  4. Orthostatic hypotension  I95.1      EKG 02/01/2019: Atrial fibrillation at 66 bpm, normal axis, LVH, no evidence of ischemia.   EKG 11/27/2017: Sinus bradycardia at 50 bpm, left atrial enlargement, normal axis, LVH.  No evidence of ischemia.  One PAC.  Recommendations:   Patient now has advanced prostate cancer with metastatic disease, due to intracranial bleed that was noted recently after metastases to  the skull, he was not started on anticoagulation. We have discussed with his oncologist and he does not recommend starting anticoagulation even on short term basis as he feels his risk of bleeding is high.  Patient continues to be in Atrial fibrillation and his continuing to have RVR particularly first thing in the morning with having to take several doses of diltiazem. He is rate controlled today by EKG in our office. Cardioversion is not an option in view of inability to anticoagulate. Will persue rate control therapy and have advised that he will likely continue to have at least some mild symptoms from A fib, but should improve with time. He is hoping to start a new cancer medication, and there is interaction with diltiazem. Will discontinue this. He is found to not be on Metoprolol, that we previously had prescribed. I will send in another prescription of Metoprolol 50 mg BID, he can take an additional dose as needed for elevated HR. He previously had symptomatic bradycardia with metoprolol and was on Bystolic.   Blood pressure is low in our office, but generally stable at home. He does have symptoms of orthostatic hypotension, but not noted to be orthostatic today. I have advised him to continue with wearing support stockings, staying well hydrated, and slow position changes. Likely related to A fib.   He does have some chest tightness with RVR episodes that is likely demand ischemia. Previously without symptoms of  angina prior to A fib. He does have hyperlipidemia and has not tolerated statins in the past. He could benefit from PCSK9 inhibitor; however, will hold off for now once A fib is controlled and see how he is responding to therapy for prostate cancer. We will see him back in 4 weeks for follow up on A fib.    *I have discussed this case with Dr. Einar Gip and he personally examined the patient and participated in formulating the plan.*   Miquel Dunn, MSN, APRN, FNP-C [facility]. [place] Office: [phone] Fax: [phone]

## 2019-02-09 ENCOUNTER — Other Ambulatory Visit: Payer: Self-pay | Admitting: Cardiology

## 2019-02-09 DIAGNOSIS — I4819 Other persistent atrial fibrillation: Secondary | ICD-10-CM

## 2019-02-09 MED ORDER — DIGOXIN 125 MCG PO TABS: 0.1250 mg | ORAL_TABLET | Freq: Every day | ORAL | 1 refills | Status: DC

## 2019-02-09 NOTE — Telephone Encounter (Signed)
Please respond

## 2019-02-09 NOTE — Progress Notes (Signed)
Patient sent a message to the portal stating that he is dizzy with metoprolol and would like to discontinue.  In view of atrial fibrillation with RVR, I'll start the patient on digoxin.

## 2019-02-10 NOTE — Telephone Encounter (Signed)
Alejandro respond

## 2019-02-10 NOTE — Telephone Encounter (Signed)
He still wants a reply from you he called this morning

## 2019-02-11 ENCOUNTER — Other Ambulatory Visit: Payer: Self-pay | Admitting: Cardiology

## 2019-02-11 DIAGNOSIS — I10 Essential (primary) hypertension: Secondary | ICD-10-CM

## 2019-02-11 DIAGNOSIS — I4819 Other persistent atrial fibrillation: Secondary | ICD-10-CM

## 2019-02-11 MED ORDER — METOPROLOL TARTRATE 50 MG PO TABS: 25.0000 mg | ORAL_TABLET | Freq: Two times a day (BID) | ORAL | 2 refills | Status: DC

## 2019-02-11 MED ORDER — AMIODARONE HCL 200 MG PO TABS: 200.0000 mg | ORAL_TABLET | Freq: Two times a day (BID) | ORAL | 1 refills | Status: DC

## 2019-02-11 NOTE — Telephone Encounter (Signed)
Patient called me stating that he has been having worsening symptoms of fatigue, palpitations, heart rate from 110 bpm all the way up to 150 bpm and he does not feel well.  His blood pressure is very soft, he has measured his blood pressure to be 110/7274 mmHg, he is unable to tolerate metoprolol as it makes him to feel very fatigued and tired, only option is amiodarone for rate control.  He will start 200 mg p.o. 3 times daily for 1 week followed by 200 mg daily.  He will keep his appointment to see me back in couple weeks.  He is not a candidate for anticoagulation.  His only other option would be AV node ablation and placement of a pacemaker if this fails.  He is presently on digoxin, will continue the same and once rate is controlled I will consider to discontinue digoxin.

## 2019-02-11 NOTE — Telephone Encounter (Signed)
Please respond

## 2019-02-14 ENCOUNTER — Inpatient Hospital Stay: Payer: BC Managed Care – PPO | Attending: Oncology | Admitting: Oncology

## 2019-02-14 ENCOUNTER — Other Ambulatory Visit: Payer: Self-pay

## 2019-02-14 ENCOUNTER — Inpatient Hospital Stay: Payer: BC Managed Care – PPO

## 2019-02-14 VITALS — BP 127/85 | HR 63 | Temp 98.3°F | Resp 17 | Wt 231.0 lb

## 2019-02-14 DIAGNOSIS — Z9221 Personal history of antineoplastic chemotherapy: Secondary | ICD-10-CM | POA: Diagnosis not present

## 2019-02-14 DIAGNOSIS — Z79899 Other long term (current) drug therapy: Secondary | ICD-10-CM

## 2019-02-14 DIAGNOSIS — Z192 Hormone resistant malignancy status: Secondary | ICD-10-CM | POA: Diagnosis not present

## 2019-02-14 DIAGNOSIS — R5383 Other fatigue: Secondary | ICD-10-CM | POA: Insufficient documentation

## 2019-02-14 DIAGNOSIS — I4891 Unspecified atrial fibrillation: Secondary | ICD-10-CM | POA: Diagnosis not present

## 2019-02-14 DIAGNOSIS — D696 Thrombocytopenia, unspecified: Secondary | ICD-10-CM | POA: Diagnosis not present

## 2019-02-14 DIAGNOSIS — C7951 Secondary malignant neoplasm of bone: Secondary | ICD-10-CM

## 2019-02-14 DIAGNOSIS — Z923 Personal history of irradiation: Secondary | ICD-10-CM | POA: Insufficient documentation

## 2019-02-14 DIAGNOSIS — C61 Malignant neoplasm of prostate: Secondary | ICD-10-CM | POA: Insufficient documentation

## 2019-02-14 DIAGNOSIS — D72819 Decreased white blood cell count, unspecified: Secondary | ICD-10-CM

## 2019-02-14 LAB — CBC WITH DIFFERENTIAL (CANCER CENTER ONLY)
Abs Immature Granulocytes: 0.07 10*3/uL (ref 0.00–0.07)
Basophils Absolute: 0 10*3/uL (ref 0.0–0.1)
Basophils Relative: 1 %
Eosinophils Absolute: 0 10*3/uL (ref 0.0–0.5)
Eosinophils Relative: 0 %
HCT: 28.3 % — ABNORMAL LOW (ref 39.0–52.0)
Hemoglobin: 9.2 g/dL — ABNORMAL LOW (ref 13.0–17.0)
Immature Granulocytes: 2 %
Lymphocytes Relative: 9 %
Lymphs Abs: 0.4 10*3/uL — ABNORMAL LOW (ref 0.7–4.0)
MCH: 31.5 pg (ref 26.0–34.0)
MCHC: 32.5 g/dL (ref 30.0–36.0)
MCV: 96.9 fL (ref 80.0–100.0)
Monocytes Absolute: 1.4 10*3/uL — ABNORMAL HIGH (ref 0.1–1.0)
Monocytes Relative: 30 %
Neutro Abs: 2.8 10*3/uL (ref 1.7–7.7)
Neutrophils Relative %: 58 %
Platelet Count: 172 10*3/uL (ref 150–400)
RBC: 2.92 MIL/uL — ABNORMAL LOW (ref 4.22–5.81)
RDW: 16.7 % — ABNORMAL HIGH (ref 11.5–15.5)
WBC Count: 4.7 10*3/uL (ref 4.0–10.5)
nRBC: 1.5 % — ABNORMAL HIGH (ref 0.0–0.2)

## 2019-02-14 LAB — CMP (CANCER CENTER ONLY)
ALT: 21 U/L (ref 0–44)
AST: 22 U/L (ref 15–41)
Albumin: 3.7 g/dL (ref 3.5–5.0)
Alkaline Phosphatase: 68 U/L (ref 38–126)
Anion gap: 9 (ref 5–15)
BUN: 18 mg/dL (ref 8–23)
CO2: 24 mmol/L (ref 22–32)
Calcium: 8.9 mg/dL (ref 8.9–10.3)
Chloride: 103 mmol/L (ref 98–111)
Creatinine: 0.96 mg/dL (ref 0.61–1.24)
GFR, Est AFR Am: >60 mL/min (ref >60)
GFR, Estimated: >60 mL/min (ref >60)
Glucose, Bld: 115 mg/dL — ABNORMAL HIGH (ref 70–99)
Potassium: 4.8 mmol/L (ref 3.5–5.1)
Sodium: 136 mmol/L (ref 135–145)
Total Bilirubin: 0.6 mg/dL (ref 0.3–1.2)
Total Protein: 6.6 g/dL (ref 6.5–8.1)

## 2019-02-14 LAB — DIC (DISSEMINATED INTRAVASCULAR COAGULATION)PANEL
D-Dimer, Quant: 2.35 ug/mL-FEU — ABNORMAL HIGH (ref 0.00–0.50)
Fibrinogen: 518 mg/dL — ABNORMAL HIGH (ref 210–475)
INR: 1.1 (ref 0.8–1.2)
Platelets: 187 10*3/uL (ref 150–400)
Prothrombin Time: 13.6 seconds (ref 11.4–15.2)
Smear Review: NONE SEEN
aPTT: 29 seconds (ref 24–36)

## 2019-02-14 LAB — PROTIME-INR
INR: 1.1 (ref 0.8–1.2)
Prothrombin Time: 13.8 seconds (ref 11.4–15.2)

## 2019-02-14 LAB — APTT: aPTT: 28 seconds (ref 24–36)

## 2019-02-14 NOTE — Progress Notes (Signed)
Hematology and Oncology Follow Up Visit  [name] [id number] 1956/02/19 63 y.o. 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  He initially was found to have PSA 6.4 and a Gleason score of 8.      Prior Therapy:   Status post biopsy done on July of 2015 followed by lymph node dissection completed in 2015 at [facility].  He was treated there with definitive radiation therapy utilizing seed implant as well as external beam radiation completed in 2016.  He developed advanced disease with bone involvement.  He received radiation therapy to the pelvis in 2017.  Xtandi 160 mg daily that was poorly tolerated and was discontinued.  He was switched to [name] which she took until 2019 and developed progression of disease with bony metastasis as well as PSA of 80.  Taxotere chemotherapy at 75 mg/m cycle 1 given in March 2019.  He is status post 6 cycles of chemotherapy last cycle given on February 03, 2018.   Status post clinical trial utilizing Darolutamide as well as LuPSAM a part of a clinical trial at [facility] in [place].  He is status post 4 cycles of therapy.  Last cycle given on October 01, 2018.  Therapy discontinued as a progression of disease.   Current therapy:   Lupron 30 mg every 4 months.   Xgeva 120 mg every 4 weeks.   Other evaluation for different salvage therapy.    Interim History:  Mr. Alejandro [name] is here for return evaluation.  Since the last visit, he reports no major changes in his health.  He continues to have issues with fatigue and tiredness associated with his atrial fibrillation.  He denies any excessive bleeding, mopped assist or hematochezia.  He denies any chest pain but does report occasional palpitation.  He has not reported any recent hospitalization or illnesses.  He  does ambulate although moving slowly at this time.  He denies any recent falls or syncope.  Patient denied headaches, blurry vision, syncope or seizures.  Denies any fevers, chills or sweats.  Denied chest pain, palpitation, orthopnea or leg edema.  Denied cough, wheezing or hemoptysis.  Denied nausea, vomiting or abdominal pain.  Denies any constipation or diarrhea.  Denies any frequency urgency or hesitancy.  Denies any arthralgias or myalgias.  Denies any skin rashes or lesions.  Denies any bleeding or clotting tendency.  Denies any easy bruising.  Denies any hair or nail changes.  Denies any anxiety or depression.  Remaining review of system is negative.       Medications: I have reviewed the patient's current medications.  Unchanged by my review. Current Outpatient Medications  Medication Sig Dispense Refill  . acetaminophen (TYLENOL) 325 MG tablet Take 650 mg by mouth every 6 (six) hours as needed for moderate pain.    Marland Kitchen amiodarone (PACERONE) 200 MG tablet Take 1 tablet (200 mg total) by mouth 2 (two) times daily. Take one tab three times a day for 1 week 70 tablet 1  . amitriptyline (ELAVIL) 10 MG tablet Take 20 mg by mouth at bedtime.     . calcium-vitamin D (OSCAL WITH D) 500-200 MG-UNIT tablet Take 1 tablet by mouth 2 (two) times daily. 90 tablet 3  . cholecalciferol (VITAMIN D3) 25 MCG (1000 UT) tablet Take 1,000 Units by mouth daily.    . digoxin (LANOXIN) 0.125 MG tablet  Take 1 tablet (0.125 mg total) by mouth daily. 30 tablet 1  . ezetimibe (ZETIA) 10 MG tablet Take 10 mg by mouth daily.    . fluticasone (FLONASE) 50 MCG/ACT nasal spray Place 1-2 sprays into both nostrils daily as needed for allergies.     Marland Kitchen ibuprofen (ADVIL) 600 MG tablet Take 600 mg by mouth every 6 (six) hours as needed for moderate pain.    Marland Kitchen loratadine (CLARITIN) 10 MG tablet Take 10 mg by mouth daily.    Marland Kitchen losartan (COZAAR) 50 MG tablet Take 50 mg by mouth daily.     . magic mouthwash w/lidocaine SOLN Take  5 mLs by mouth 4 (four) times daily as needed for mouth pain. 240 mL 0  . metoprolol tartrate (LOPRESSOR) 50 MG tablet Take 0.5 tablets (25 mg total) by mouth 2 (two) times daily. Can take an additional dose for uncontrolled HR 30 tablet 2  . morphine (MS CONTIN) 15 MG 12 hr tablet Take 15 mg by mouth 2 (two) times daily as needed for pain.    . NON FORMULARY 1 gram salt tablet    . NON FORMULARY Pt on antiobiotic for 1 more week doesn't know the name    . tamsulosin (FLOMAX) 0.4 MG CAPS capsule TAKE 1 CAPSULE BY MOUTH TWICE DAILY 60 capsule 0  . TURMERIC PO Take 1 tablet by mouth daily.     No current facility-administered medications for this visit.      Allergies:  Allergies  Allergen Reactions  . Codeine Nausea Only    Past Medical History, Surgical history, Social history, and Family History remained unchanged by review.   Physical Exam:   Blood pressure 127/85, pulse 63, temperature 98.3 F (36.8 C), temperature source Tympanic, resp. rate 17, weight 231 lb (104.8 kg), SpO2 100 %.     ECOG: 1    General appearance: Alert, awake without any distress. Head: Atraumatic without abnormalities Oropharynx: Without any thrush or ulcers. Eyes: No scleral icterus. Lymph nodes: No lymphadenopathy noted in the cervical, supraclavicular, or axillary nodes Heart: Irregular without any murmur.  No lower extremity edema. Lung: Clear to auscultation without any rhonchi, wheezes or dullness to percussion. Abdomin: Soft, nontender without any shifting dullness or ascites. Musculoskeletal: No clubbing or cyanosis. Neurological: No motor or sensory deficits. Skin: No rashes or lesions. Psychiatric: Mood and affect appeared normal.         Lab Results: Lab Results  Component Value Date   WBC 7.5 01/17/2019   HGB 13.6 01/17/2019   HCT 41.7 01/17/2019   MCV 95.4 01/17/2019   PLT 170 01/17/2019     Chemistry      Component Value Date/Time   NA 131 (L) 01/17/2019 0925    NA 135 (L) 06/13/2014 0944   K 4.9 01/17/2019 0925   K 5.4 (H) 06/13/2014 0944   CL 98 01/17/2019 0925   CO2 20 (L) 01/17/2019 0925   CO2 25 06/13/2014 0944   BUN 40 (H) 01/17/2019 0925   BUN 18.8 06/13/2014 0944   CREATININE 0.94 01/17/2019 0925   CREATININE 1.0 06/13/2014 0944      Component Value Date/Time   CALCIUM 8.4 (L) 01/17/2019 0925   CALCIUM 10.0 06/13/2014 0944   ALKPHOS 47 01/17/2019 0925   ALKPHOS 96 06/13/2014 0944   AST 37 01/17/2019 0925   AST 104 (H) 06/13/2014 0944   ALT 109 (H) 01/17/2019 0925   ALT 126 (H) 06/13/2014 0944   BILITOT 1.0 01/17/2019 0925  BILITOT 0.63 06/13/2014 0944       Results for NIRVAN, LABAN (MRN [id number]) as of 12/28/2018 14:57  Ref. Range 03/29/2018 09:29 12/22/2018 13:17  Prostate Specific Ag, Serum Latest Ref Range: 0.0 - 4.0 ng/mL 83.9 (H) 359.0 (H)       Impression and Plan:  63 year old man with the:  1.Advanced prostate cancer with lymphadenopathy disease to the bone that is currently castration-resistant.     He is status post therapy as outlined above and has recently progressed on clinical trial in [place].  Treatment options were reviewed which include systemic chemotherapy in the form of Jevtana versus Lynparza off label therapy.  He has been able to obtain this medication via his oncologist in [place] although his next generation sequencing results are not available to me.  At this time he would like to try this medication and will obtain baseline labs before starting at this time.   2.  Bone directed therapy: He is currently receiving Xgeva every 6 to 8 weeks.  Therapy will be on hold for the time being based on his wishes.  3.  Androgen depravation: Last Lupron was given in May 2020 and will be repeated at the end of September.  Long-term complications associated with this treatment include osteoporosis and weight gain.  4.  Spinal metastasis: Completed radiation therapy and tapering doses of  steroids.  Bone pain is under control.  5.  Atrial fibrillation: He continues to follow with Dr. Einar Gip regarding this issue.  6.  Leukocytopenia and thrombocytopenia: Likely related to his previous treatment and marrow failure related to prostate cancer.  Will obtain repeat CBC as well as a coagulation panel as well as DIC panel.  No active bleeding noted at this time.  7.  Prognosis: His disease is incurable and any therapy appears to be palliative moving forward.  His performance status remains adequate however he continues to desire aggressive measures.  We have discussed advanced directives today in detail.  He is not against cardioversion but would be against full cardiopulmonary resuscitation in case of both cardiac or respiratory failures.  8. Follow-up: In 4 weeks to follow his progress.  25 minutes was spent with the patient face-to-face today.  More than 50% of time was spent on reviewing his disease status, treatment options, complications related to these therapies and answering questions regarding future plan of care and prognosis.Zola Button, MD 7/13/20209:12 AM

## 2019-02-15 LAB — PROSTATE-SPECIFIC AG, SERUM (LABCORP): Prostate Specific Ag, Serum: 369 ng/mL — ABNORMAL HIGH (ref 0.0–4.0)

## 2019-02-15 NOTE — Telephone Encounter (Signed)
Please respond

## 2019-02-17 NOTE — Telephone Encounter (Signed)
Please respond

## 2019-02-18 ENCOUNTER — Encounter: Payer: Self-pay | Admitting: Oncology

## 2019-02-19 ENCOUNTER — Telehealth: Payer: BC Managed Care – PPO | Admitting: Cardiology

## 2019-02-19 DIAGNOSIS — I480 Paroxysmal atrial fibrillation: Secondary | ICD-10-CM

## 2019-02-19 NOTE — Telephone Encounter (Signed)
This morning regarding patient picking up with atrial fibrillation with RVR, heart rate of 170-180 bpm, patient not feeling well.  I will had multiple emails and telephone encounter with the patient regarding medications, he is been having multiple complaints of not feeling well with any of the medications are prescribed.  I also offered him office visit.  Advised him to take 400 mg of amiodarone t.i.d. today and to reduce to 200 mg b.i.d. starting tomorrow four-week and then 200 mg daily.  Also advised him to restart digoxin 0.125 mg daily, and to take one extra dose of metoprolol heart rate 50 mg.  I discussed with him that he has to deal with having mild dizziness on my fatigue with metoprolol and the medications prescribed versus having episodes of atrial fibrillation with RVR. 10 minute telephone encounter.

## 2019-02-24 ENCOUNTER — Other Ambulatory Visit: Payer: Self-pay | Admitting: Oncology

## 2019-02-25 ENCOUNTER — Other Ambulatory Visit: Payer: Self-pay | Admitting: Oncology

## 2019-02-25 MED ORDER — MORPHINE SULFATE ER 15 MG PO TBCR: 15.0000 mg | EXTENDED_RELEASE_TABLET | Freq: Two times a day (BID) | ORAL | 0 refills | Status: DC | PRN

## 2019-02-27 ENCOUNTER — Other Ambulatory Visit: Payer: Self-pay | Admitting: Oncology

## 2019-02-27 DIAGNOSIS — C7951 Secondary malignant neoplasm of bone: Secondary | ICD-10-CM

## 2019-02-27 DIAGNOSIS — C61 Malignant neoplasm of prostate: Secondary | ICD-10-CM

## 2019-03-01 ENCOUNTER — Other Ambulatory Visit: Payer: Self-pay | Admitting: Cardiology

## 2019-03-01 NOTE — Telephone Encounter (Signed)
Please read

## 2019-03-04 ENCOUNTER — Telehealth: Payer: Self-pay

## 2019-03-04 NOTE — Telephone Encounter (Signed)
Received call from the patient stating that he started taking Lynparza last week and has been having "real bad" side effects and would like to know if he can just stop taking the Lynparza or if he needs to wean himself off. Per Dr. Alen Blew patient is to stop taking the [place] Contacted patient and he stated that since he has started taking the [place] he has been so weak and fatigued that he has been in bed more hours in the day than out of bed. He also stated that he has been out of A-fib since last week Monday but he has had episodes in the last week in which he has had an irregular HR and SOB. Informed the patient to stop taking the Lynparza per Dr. Alen Blew and if the irregular HR worsens along with the SOB to go to the ED. Patient verbalized understanding and had no other questions or concerns.

## 2019-03-09 ENCOUNTER — Other Ambulatory Visit: Payer: Self-pay

## 2019-03-09 ENCOUNTER — Encounter: Payer: Self-pay | Admitting: Cardiology

## 2019-03-09 ENCOUNTER — Ambulatory Visit (INDEPENDENT_AMBULATORY_CARE_PROVIDER_SITE_OTHER): Payer: BC Managed Care – PPO | Admitting: Cardiology

## 2019-03-09 VITALS — BP 143/89 | HR 75 | Ht 70.0 in | Wt 229.2 lb

## 2019-03-09 DIAGNOSIS — I480 Paroxysmal atrial fibrillation: Secondary | ICD-10-CM

## 2019-03-09 DIAGNOSIS — I25118 Atherosclerotic heart disease of native coronary artery with other forms of angina pectoris: Secondary | ICD-10-CM

## 2019-03-09 DIAGNOSIS — I10 Essential (primary) hypertension: Secondary | ICD-10-CM

## 2019-03-09 DIAGNOSIS — R0600 Dyspnea, unspecified: Secondary | ICD-10-CM

## 2019-03-09 DIAGNOSIS — I4819 Other persistent atrial fibrillation: Secondary | ICD-10-CM

## 2019-03-09 DIAGNOSIS — R0609 Other forms of dyspnea: Secondary | ICD-10-CM

## 2019-03-09 MED ORDER — DILTIAZEM HCL ER COATED BEADS 240 MG PO CP24: 240.0000 mg | ORAL_CAPSULE | Freq: Every day | ORAL | 2 refills | Status: DC

## 2019-03-09 MED ORDER — AMIODARONE HCL 200 MG PO TABS: 200.0000 mg | ORAL_TABLET | Freq: Two times a day (BID) | ORAL | 1 refills | Status: DC

## 2019-03-09 NOTE — Progress Notes (Signed)
Primary Physician/Referring:  [name], MD  Patient ID: Alejandro Ready., male    DOB: 10/25/1955, 63 y.o.   MRN: [id number]  Chief Complaint  Patient presents with  . Atrial Fibrillation  . Follow-up    4wk    HPI: Alejandro Phares Zaccone.  is a 63 y.o. male  with  coronary atherosclerosis, coronary angiography in 10/13/2007 had revealed occluded RCA. He had mild disease diffusely in the other vessels, dyslipidemia, hypertension, OSA on CPAP and moderate obesity as well as an abdominal aortic atherosclerosis with severe calcification. He also has chronic orthostatic hypotension and supine hypertension. He has now developed Paroxysmal extremely symptomatic atrial fibrillation with RVR and felt to be not a candidate for anticoagulation.  Patient with fairly aggressive prostate cancer, now felt to be treated for palliative purposes only.  I have had multiple telephone encounter send emails regarding him not feeling well when he goes into atrial fibrillation, continues to have elevated blood pressure and heart rate, feels extremely dizzy and has chest discomfort and  extremely nervous and anxiety.   Is unable to tolerate metoprolol as it causes him to have marked fatigue.  He is been taking metoprolol on a p.r.n. basis when he goes into atrial fibrillation.  He had discontinued amlodipine due to low blood pressure.  He has now developed elevated blood pressure. 1 month OV. He has multiple questions regarding A. FIb. He has now decided not to undergo any more chemotherapy for advanced prostate cancer and( comfort measures only. Past Medical History:  Diagnosis Date  . ANXIETY   . AORTIC STENOSIS, MILD   . CHEST PAIN-UNSPECIFIED   . DYSPNEA   . HYPERCHOLESTEROLEMIA   . Hypertension   . INSOMNIA   . OSA on CPAP 05/31/2013  . OSTEOARTHRITIS, KNEE   . Prostate cancer [facility])     Past Surgical History:  Procedure Laterality Date  . COLONOSCOPY    . ELBOW SURGERY    . LUMBAR  LAMINECTOMY  Late 1990's  . NEUROPLASTY / TRANSPOSITION MEDIAN NERVE AT CARPAL TUNNEL BILATERAL    . PROSTATE BIOPSY      Social History   Socioeconomic History  . Marital status: Divorced    Spouse name: Not on file  . Number of children: 1  . Years of education: college  . Highest education level: Not on file  Occupational History  . Occupation: CONSULTANT    Employer: [employer]  Social Needs  . Financial resource strain: Not on file  . Food insecurity    Worry: Not on file    Inability: Not on file  . Transportation needs    Medical: Not on file    Non-medical: Not on file  Tobacco Use  . Smoking status: Former Smoker    Packs/day: 3.00    Years: 25.00    Pack years: 75.00    Types: Cigars, Cigarettes    Quit date: 12/03/1998    Years since quitting: 20.2  . Smokeless tobacco: Never Used  . Tobacco comment: Former 3 ppd smoker for 30 years quit heavy smoking 15 years ago  Substance and Sexual Activity  . Alcohol use: Yes    Alcohol/week: 12.0 standard drinks    Types: 12 Standard drinks or equivalent per week    Comment: 2 drinks per day  . Drug use: No  . Sexual activity: Yes  Lifestyle  . Physical activity    Days per week: Not on file    Minutes per session: Not  on file  . Stress: Not on file  Relationships  . Social Herbalist on phone: Not on file    Gets together: Not on file    Attends religious service: Not on file    Active member of club or organization: Not on file    Attends meetings of clubs or organizations: Not on file    Relationship status: Not on file  . Intimate partner violence    Fear of current or ex partner: Not on file    Emotionally abused: Not on file    Physically abused: Not on file    Forced sexual activity: Not on file  Other Topics Concern  . Not on file  Social History Narrative  . Not on file    Current Outpatient Medications on File Prior to Visit  Medication Sig Dispense Refill  . acetaminophen  (TYLENOL) 325 MG tablet Take 650 mg by mouth every 6 (six) hours as needed for moderate pain.    Marland Kitchen amitriptyline (ELAVIL) 10 MG tablet Take 20 mg by mouth at bedtime.     . calcium-vitamin D (OSCAL WITH D) 500-200 MG-UNIT tablet Take 1 tablet by mouth 2 (two) times daily. 90 tablet 3  . cholecalciferol (VITAMIN D3) 25 MCG (1000 UT) tablet Take 1,000 Units by mouth daily.    Marland Kitchen ezetimibe (ZETIA) 10 MG tablet Take 10 mg by mouth daily.    . fluticasone (FLONASE) 50 MCG/ACT nasal spray Place 1-2 sprays into both nostrils daily as needed for allergies.     Marland Kitchen ibuprofen (ADVIL) 600 MG tablet Take 600 mg by mouth every 6 (six) hours as needed for moderate pain.    Marland Kitchen loratadine (CLARITIN) 10 MG tablet Take 10 mg by mouth as needed.     Marland Kitchen losartan (COZAAR) 50 MG tablet Take 50 mg by mouth daily.     Marland Kitchen MELATONIN PO Take 12 mg by mouth at bedtime.    . metoprolol tartrate (LOPRESSOR) 50 MG tablet Take 0.5 tablets (25 mg total) by mouth 2 (two) times daily. Can take an additional dose for uncontrolled HR (Patient taking differently: Take 25 mg by mouth as needed. Can take an additional dose for uncontrolled HR) 30 tablet 2  . morphine (MS CONTIN) 15 MG 12 hr tablet Take 1 tablet (15 mg total) by mouth 2 (two) times daily as needed for pain. (Patient taking differently: Take 15 mg by mouth. 1-2 at bedtime) 60 tablet 0  . NON FORMULARY 1 gram salt tablet    . tamsulosin (FLOMAX) 0.4 MG CAPS capsule TAKE ONE CAPSULE BY MOUTH TWICE DAILY 60 capsule 0  . TURMERIC PO Take 1 tablet by mouth daily.    Marland Kitchen amLODipine (NORVASC) 5 MG tablet TAKE 1 TABLET BY MOUTH DAILY (Patient not taking: PT HOLDING) 90 tablet 0   No current facility-administered medications on file prior to visit.    Review of Systems  Constitution: Positive for malaise/fatigue and weight loss (stable and intentional). Negative for chills, decreased appetite and weight gain.  Cardiovascular: Positive for dyspnea on exertion (stable). Negative for leg  swelling and syncope.  Endocrine: Negative for cold intolerance.  Hematologic/Lymphatic: Does not bruise/bleed easily.  Musculoskeletal: Negative for joint swelling.  Gastrointestinal: Negative for abdominal pain, anorexia and change in bowel habit.  Genitourinary: Positive for decreased libido and frequency.  Neurological: Positive for dizziness. Negative for headaches and light-headedness.  Psychiatric/Behavioral: Positive for depression. Negative for substance abuse. The patient is nervous/anxious.   All  other systems reviewed and are negative.     Objective  Blood pressure (!) 143/89, pulse 75, height 5\' 10"  (1.778 m), weight 229 lb 3.2 oz (104 kg), SpO2 98 %. Body mass index is 32.89 kg/m.     Physical Exam  Constitutional: He appears well-developed. No distress.  Mildly obese  HENT:  Head: Atraumatic.  Eyes: Conjunctivae are normal.  Neck: Neck supple. No JVD present. No thyromegaly present.  Cardiovascular: Regular rhythm, normal heart sounds, intact distal pulses and normal pulses. Exam reveals no gallop, no S3 and no S4.  No murmur heard. No edema  Pulmonary/Chest: Effort normal and breath sounds normal.  Abdominal: Soft. Bowel sounds are normal.  Musculoskeletal: Normal range of motion.  Neurological: He is alert.  Skin: Skin is warm and dry.  Psychiatric: He has a normal mood and affect.   Radiology: No results found.  Laboratory examination:   CMP Latest Ref Rng & Units 02/14/2019 01/17/2019 01/13/2019  Glucose 70 - 99 mg/dL 115(H) 156(H) -  BUN 8 - 23 mg/dL 18 40(H) -  Creatinine 0.61 - 1.24 mg/dL 0.96 0.94 0.91  Sodium 135 - 145 mmol/L 136 131(L) -  Potassium 3.5 - 5.1 mmol/L 4.8 4.9 -  Chloride 98 - 111 mmol/L 103 98 -  CO2 22 - 32 mmol/L 24 20(L) -  Calcium 8.9 - 10.3 mg/dL 8.9 8.4(L) -  Total Protein 6.5 - 8.1 g/dL 6.6 6.2(L) -  Total Bilirubin 0.3 - 1.2 mg/dL 0.6 1.0 -  Alkaline Phos 38 - 126 U/L 68 47 -  AST 15 - 41 U/L 22 37 -  ALT 0 - 44 U/L 21  109(H) -   CBC Latest Ref Rng & Units 02/14/2019 02/14/2019 01/17/2019  WBC 4.0 - 10.5 K/uL 4.7 - 7.5  Hemoglobin 13.0 - 17.0 g/dL 9.2(L) - 13.6  Hematocrit 39.0 - 52.0 % 28.3(L) - 41.7  Platelets 150 - 400 K/uL 172 187 170   Lipid Panel  No results found for: CHOL, TRIG, HDL, CHOLHDL, VLDL, LDLCALC, LDLDIRECT HEMOGLOBIN A1C No results found for: HGBA1C, MPG TSH Recent Labs    01/13/19 1955  TSH 0.550    Cardiac Studies:   Coronary angiogram 10/13/2007: Mild diffuse CAD involving LAD and circumflex coronary artery.  RCA occluded in the proximal segment, with left-to-right and right-to-left right region collaterals.  Ejection fraction 55% with mild inferobasal hypokinesis.  Mild aortic stenosis.  Lexiscan sestamibi stress test 03/17/2014: 1. Resting EKG demonstrated normal sinus rhythm. Initially patient exercised for 8 minutes and 35 seconds on a Bruce protocol and achieved 13.4 METs. However patient achieved only 73% of age-predicted maximum heart rate, hence stress test had to be terminated and switched over to [place] sestamibi stress test. There is no ST-T wave changes for ischemia with exercise stress test, Lexiscan stress EKG revealed no ST-T wave changes ischemia. 2. The quality of the stress images was good. There is minimal inferior wall diaphragmatic attenuation artifact without evidence of ischemia. Left ventricle systolic function Related by QGS was 64%. The left ventricle was normal in size both in rest and stress images. This is a low risk study.  Echocardiogram [12/03/2016]: Left ventricle cavity is normal in size. Mild concentric hypertrophy of the left ventricle. Normal global wall motion. Normal diastolic filling pattern. Calculated EF 66%. Left atrial cavity is moderately dilated at 4.7 cm. Mild aortic valve leaflet thickening with mild calcification. No evidence of aortic valve stenosis. Mild to moderate aortic regurgitation. Mild (Grade I) mitral regurgitation. Mild  tricuspid regurgitation. Mild pulmonary hypertension. Pulmonary artery systolic pressure is estimated at 34 mm Hg. The aortic root is mildly dilated at 4.0 cm. Compared to the study done on 03/30/2014, previously aortic regurgitation was very mild. Pulmonary hypertension is new.   Assessment     ICD-10-CM   1. Paroxysmal atrial fibrillation (HCC)  I48.0 EKG 12-Lead  2. Essential hypertension  I10   3. Coronary artery disease of native artery of native heart with stable angina pectoris ([place])  I25.118   4. Dyspnea on exertion  R06.09     CHA2DS2-VASc Score is 2.  Yearly risk of stroke: 2.2%.    EKG 0/12/2018: Normal sinus rhythm at rate of 70 bpm, left atrial abnormality.  Normal axis, nonspecific T abnormality.  Normal QT interval.  EKG 01/13/2019: Atrial fibrillation with rapid ventricular response at the rate of 160 bpm, LVH with repolarization abnormality, cannot exclude inferior and lateral ischemia.  EKG 11/27/2017: Sinus bradycardia at 50 bpm, left atrial enlargement, normal axis, LVH.  No evidence of ischemia.  One PAC.  Recommendations:   Patient now has advanced prostate cancer with metastatic disease, due to intracranial bleed that was noted recently after metastases to the skull, he was not started on anticoagulation.  I have had multiple telephone calls, telephone encounter and also messages regarding persistent atrial fibrillation, inability to tolerate multiple medications.  He has been unable to tolerate metoprolol on a daily basis although it appears that metoprolol is helped him with conversion back to sinus rhythm and also rate control.  He has discontinued amlodipine.  Blood pressure is now trending upwards, we discussed at length regarding rate control versus rhythm control, patient extremely symptomatic with atrial fibrillation onset.  Hence rhythm control would be appropriate although he is not a candidate for anticoagulation due to metastatic prostate cancer.  I will  start him on diltiazem CD 240 mg daily both for rate control and for hypertension.  If he tolerates this for a week, then advised him to start amiodarone 200 mg p.o. b.i.d. for a week followed by 200 mg once a day.  I'd like to see him back in 4 weeks for follow-up hypertension and atrial fibrillation with repeat EKG.  Adrian Prows, MD, [facility] 03/09/2019, 10:38 AM [facility] [place] Pager: [phone] Office: [phone] If no answer Cell [phone]

## 2019-03-14 ENCOUNTER — Inpatient Hospital Stay: Payer: BC Managed Care – PPO | Attending: Oncology | Admitting: Oncology

## 2019-03-14 ENCOUNTER — Inpatient Hospital Stay: Payer: BC Managed Care – PPO

## 2019-03-14 ENCOUNTER — Other Ambulatory Visit: Payer: Self-pay

## 2019-03-14 VITALS — BP 128/84 | HR 90 | Temp 97.8°F | Resp 17 | Wt 225.1 lb

## 2019-03-14 DIAGNOSIS — Z923 Personal history of irradiation: Secondary | ICD-10-CM | POA: Insufficient documentation

## 2019-03-14 DIAGNOSIS — R110 Nausea: Secondary | ICD-10-CM | POA: Insufficient documentation

## 2019-03-14 DIAGNOSIS — C61 Malignant neoplasm of prostate: Secondary | ICD-10-CM

## 2019-03-14 DIAGNOSIS — I4891 Unspecified atrial fibrillation: Secondary | ICD-10-CM | POA: Diagnosis not present

## 2019-03-14 DIAGNOSIS — C7951 Secondary malignant neoplasm of bone: Secondary | ICD-10-CM

## 2019-03-14 DIAGNOSIS — R9721 Rising PSA following treatment for malignant neoplasm of prostate: Secondary | ICD-10-CM | POA: Diagnosis not present

## 2019-03-14 DIAGNOSIS — R5383 Other fatigue: Secondary | ICD-10-CM | POA: Insufficient documentation

## 2019-03-14 DIAGNOSIS — D72819 Decreased white blood cell count, unspecified: Secondary | ICD-10-CM | POA: Insufficient documentation

## 2019-03-14 DIAGNOSIS — R590 Localized enlarged lymph nodes: Secondary | ICD-10-CM | POA: Diagnosis not present

## 2019-03-14 DIAGNOSIS — D696 Thrombocytopenia, unspecified: Secondary | ICD-10-CM | POA: Insufficient documentation

## 2019-03-14 DIAGNOSIS — R42 Dizziness and giddiness: Secondary | ICD-10-CM | POA: Insufficient documentation

## 2019-03-14 DIAGNOSIS — Z79899 Other long term (current) drug therapy: Secondary | ICD-10-CM | POA: Insufficient documentation

## 2019-03-14 DIAGNOSIS — Z191 Hormone sensitive malignancy status: Secondary | ICD-10-CM | POA: Insufficient documentation

## 2019-03-14 LAB — CBC WITH DIFFERENTIAL (CANCER CENTER ONLY)
Abs Immature Granulocytes: 0.02 K/uL (ref 0.00–0.07)
Basophils Absolute: 0.1 K/uL (ref 0.0–0.1)
Basophils Relative: 1 %
Eosinophils Absolute: 0.1 K/uL (ref 0.0–0.5)
Eosinophils Relative: 2 %
HCT: 33.4 % — ABNORMAL LOW (ref 39.0–52.0)
Hemoglobin: 11 g/dL — ABNORMAL LOW (ref 13.0–17.0)
Immature Granulocytes: 0 %
Lymphocytes Relative: 9 %
Lymphs Abs: 0.5 K/uL — ABNORMAL LOW (ref 0.7–4.0)
MCH: 30.7 pg (ref 26.0–34.0)
MCHC: 32.9 g/dL (ref 30.0–36.0)
MCV: 93.3 fL (ref 80.0–100.0)
Monocytes Absolute: 0.8 K/uL (ref 0.1–1.0)
Monocytes Relative: 14 %
Neutro Abs: 4.3 K/uL (ref 1.7–7.7)
Neutrophils Relative %: 74 %
Platelet Count: 295 K/uL (ref 150–400)
RBC: 3.58 MIL/uL — ABNORMAL LOW (ref 4.22–5.81)
RDW: 16.2 % — ABNORMAL HIGH (ref 11.5–15.5)
WBC Count: 5.8 K/uL (ref 4.0–10.5)
nRBC: 0 % (ref 0.0–0.2)

## 2019-03-14 LAB — CMP (CANCER CENTER ONLY)
ALT: 8 U/L (ref 0–44)
AST: 14 U/L — ABNORMAL LOW (ref 15–41)
Albumin: 3.6 g/dL (ref 3.5–5.0)
Alkaline Phosphatase: 87 U/L (ref 38–126)
Anion gap: 14 (ref 5–15)
BUN: 12 mg/dL (ref 8–23)
CO2: 23 mmol/L (ref 22–32)
Calcium: 9.6 mg/dL (ref 8.9–10.3)
Chloride: 101 mmol/L (ref 98–111)
Creatinine: 1.11 mg/dL (ref 0.61–1.24)
GFR, Est AFR Am: >60 mL/min (ref >60)
GFR, Estimated: >60 mL/min (ref >60)
Glucose, Bld: 143 mg/dL — ABNORMAL HIGH (ref 70–99)
Potassium: 3.7 mmol/L (ref 3.5–5.1)
Sodium: 138 mmol/L (ref 135–145)
Total Bilirubin: 0.7 mg/dL (ref 0.3–1.2)
Total Protein: 7.3 g/dL (ref 6.5–8.1)

## 2019-03-14 MED ORDER — MORPHINE SULFATE 15 MG PO TABS: 15.0000 mg | ORAL_TABLET | ORAL | 0 refills | Status: DC | PRN

## 2019-03-14 NOTE — Progress Notes (Signed)
Hematology and Oncology Follow Up Visit  Alejandro [name] [id number] [birth year] 63 y.o. 03/14/2019 8:44 AM Alejandro [name], MDRusso, Alejandro Reichmann, MD   Principle Diagnosis: 63 year old man with advanced prostate cancer with lymphadenopathy and bone disease diagnosed in 2015.  He was found to have PSA 6.4 and a Gleason score of 8 at the time of diagnosis.   Prior Therapy:   Status post biopsy done on July of 2015 followed by lymph node dissection completed in 2015 at [facility].  He was treated there with definitive radiation therapy utilizing seed implant as well as external beam radiation completed in 2016.  He developed advanced disease with bone involvement.  He received radiation therapy to the pelvis in 2017.  Xtandi 160 mg daily that was poorly tolerated and was discontinued.  He was switched to [name] which she took until 2019 and developed progression of disease with bony metastasis as well as PSA of 80.  Taxotere chemotherapy at 75 mg/m cycle 1 given in March 2019.  He is status post 6 cycles of chemotherapy last cycle given on February 03, 2018.   Status post clinical trial utilizing Darolutamide as well as LuPSAM a part of a clinical trial at [facility] in [place].  He is status post 4 cycles of therapy.  Last cycle given on October 01, 2018.  Therapy discontinued as a progression of disease.   Current therapy:   Lupron 30 mg every 4 months.   Xgeva 120 mg every 4 weeks.   Lynparza 300 mg twice daily started in July 2020.    Interim History:  [name] presents today for a repeat evaluation.  Since the last visit, he continues to have issues and further decline in his quality of life and performance status.  He tried [place] for about a week towards the end of July and therapy withheld because of toxicities.  He reported nausea, fatigue and dizziness.  He has also reported periodic pain in his skull and his back he also reported an enlarging  lymph node in the right supraclavicular area.  His appetite has declined slightly and has reported less dizziness after stopping [place]   He denied any alteration mental status, neuropathy, or confusion. Denies any headaches or lethargy.  Denies any night sweats, weight loss or changes in appetite.  Denied orthopnea, dyspnea on exertion or chest discomfort.  Denies shortness of breath, difficulty breathing hemoptysis or cough.  Denies any abdominal distention, nausea, early satiety or dyspepsia.  Denies any hematuria, frequency, dysuria or nocturia.  Denies any skin irritation, dryness or rash.  Denies any ecchymosis or petechiae.  Denies any lymphadenopathy or clotting.  Denies any heat or cold intolerance.  Denies any anxiety or depression.  Remaining review of system is negative.         Medications: Updated without changes. Current Outpatient Medications  Medication Sig Dispense Refill  . acetaminophen (TYLENOL) 325 MG tablet Take 650 mg by mouth every 6 (six) hours as needed for moderate pain.    Marland Kitchen amiodarone (PACERONE) 200 MG tablet Take 1 tablet (200 mg total) by mouth 2 (two) times daily. One tablet twice daily for 1 week then once daily 70 tablet 1  . amitriptyline (ELAVIL) 10 MG tablet Take 20 mg by mouth at bedtime.     Marland Kitchen amLODipine (NORVASC) 5 MG tablet TAKE 1 TABLET BY MOUTH DAILY (Patient not taking: PT HOLDING) 90 tablet 0  . calcium-vitamin D (OSCAL WITH D) 500-200 MG-UNIT tablet Take 1 tablet  by mouth 2 (two) times daily. 90 tablet 3  . cholecalciferol (VITAMIN D3) 25 MCG (1000 UT) tablet Take 1,000 Units by mouth daily.    Marland Kitchen diltiazem (CARDIZEM CD) 240 MG 24 hr capsule Take 1 capsule (240 mg total) by mouth daily. 30 capsule 2  . ezetimibe (ZETIA) 10 MG tablet Take 10 mg by mouth daily.    . fluticasone (FLONASE) 50 MCG/ACT nasal spray Place 1-2 sprays into both nostrils daily as needed for allergies.     Marland Kitchen ibuprofen (ADVIL) 600 MG tablet Take 600 mg by mouth every 6  (six) hours as needed for moderate pain.    Marland Kitchen loratadine (CLARITIN) 10 MG tablet Take 10 mg by mouth as needed.     Marland Kitchen losartan (COZAAR) 50 MG tablet Take 50 mg by mouth daily.     Marland Kitchen MELATONIN PO Take 12 mg by mouth at bedtime.    . metoprolol tartrate (LOPRESSOR) 50 MG tablet Take 0.5 tablets (25 mg total) by mouth 2 (two) times daily. Can take an additional dose for uncontrolled HR (Patient taking differently: Take 25 mg by mouth as needed. Can take an additional dose for uncontrolled HR) 30 tablet 2  . morphine (MS CONTIN) 15 MG 12 hr tablet Take 1 tablet (15 mg total) by mouth 2 (two) times daily as needed for pain. (Patient taking differently: Take 15 mg by mouth. 1-2 at bedtime) 60 tablet 0  . NON FORMULARY 1 gram salt tablet    . tamsulosin (FLOMAX) 0.4 MG CAPS capsule TAKE ONE CAPSULE BY MOUTH TWICE DAILY 60 capsule 0  . TURMERIC PO Take 1 tablet by mouth daily.     No current facility-administered medications for this visit.      Allergies:  Allergies  Allergen Reactions  . Codeine Nausea Only    Past Medical History, Surgical history, Social history, and Family History without any change on review.   Physical Exam:     Blood pressure 128/84, pulse 90, temperature 97.8 F (36.6 C), temperature source Tympanic, resp. rate 17, weight 225 lb 2 oz (102.1 kg), SpO2 100 %.    ECOG: 1   General appearance: Comfortable appearing without any discomfort Head: Normocephalic without any trauma Oropharynx: Mucous membranes are moist and pink without any thrush or ulcers. Eyes: Pupils are equal and round reactive to light. Lymph nodes:  Palpable right supraclavicular lymph node without any erythema. Heart:regular rate and rhythm.  S1 and S2 without leg edema. Lung: Clear without any rhonchi or wheezes.  No dullness to percussion. Abdomin: Soft, nontender, nondistended with good bowel sounds.  No hepatosplenomegaly. Musculoskeletal: No joint deformity or effusion.  Full range of  motion noted. Neurological: No deficits noted on motor, sensory and deep tendon reflex exam. Skin: No petechial rash or dryness.  Appeared moist.          Lab Results: Lab Results  Component Value Date   WBC 4.7 02/14/2019   HGB 9.2 (L) 02/14/2019   HCT 28.3 (L) 02/14/2019   MCV 96.9 02/14/2019   PLT 187 02/14/2019   PLT 172 02/14/2019     Chemistry      Component Value Date/Time   NA 136 02/14/2019 0944   NA 135 (L) 06/13/2014 0944   K 4.8 02/14/2019 0944   K 5.4 (H) 06/13/2014 0944   CL 103 02/14/2019 0944   CO2 24 02/14/2019 0944   CO2 25 06/13/2014 0944   BUN 18 02/14/2019 0944   BUN 18.8 06/13/2014 0944  CREATININE 0.96 02/14/2019 0944   CREATININE 1.0 06/13/2014 0944      Component Value Date/Time   CALCIUM 8.9 02/14/2019 0944   CALCIUM 10.0 06/13/2014 0944   ALKPHOS 68 02/14/2019 0944   ALKPHOS 96 06/13/2014 0944   AST 22 02/14/2019 0944   AST 104 (H) 06/13/2014 0944   ALT 21 02/14/2019 0944   ALT 126 (H) 06/13/2014 0944   BILITOT 0.6 02/14/2019 0944   BILITOT 0.63 06/13/2014 0944             Impression and Plan:  63 year old man with the:  1.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  Marland Kitchen     He is status post multiple therapies and has progressed and currently on [place] which was started last week of July was tolerated poorly.  Risks and benefits of continuing this medication at a lower dose was reviewed and he opted to discontinue all anticancer treatment.  The natural course of his disease was reviewed and he understands that likely he will continue to decline but prefers to have quality of life moving forward.  2.  Bone directed therapy: He has received Xgeva in the past although therapy withheld because of patient preferences.  3.  Androgen depravation: He is currently on Lupron which she has been receiving every 4 months.  Last treatment was given in May will be repeated in  September.  4.  Spinal metastasis and pain: He status post radiation therapy although pain remains an issue.  His pain is reasonably manageable and currently taking morphine long-acting at nighttime only.  He does not have any short acting pain medication to use and I will prescribe short acting morphine as needed.  5.  Atrial fibrillation: Controlled and followed by cardiology at this time.  6.  Leukocytopenia and thrombocytopenia: Resolved at this time after treatment break from radiation.  7.  Prognosis: His prognosis is poor with limited life expectancy.  He is incurable malignancy that is progressing.  Advanced directives as well as end-of-life issues have been addressed on multiple occasions.  8.  Adenopathy in the right supraclavicular region: Inevitably related to progressive cancer.  Active radiation therapy can be used if they become symptomatic.  8. Follow-up: In 6 to 8 weeks for repeat evaluation.  25 minutes was spent with the patient face-to-face today.  More than 50% of time was dedicated to reviewing his disease status, discussing treatment options and managing complications related to therapy.       Zola Button, MD 8/10/20208:44 AM

## 2019-03-15 ENCOUNTER — Telehealth: Payer: Self-pay | Admitting: Oncology

## 2019-03-15 LAB — PROSTATE-SPECIFIC AG, SERUM (LABCORP): Prostate Specific Ag, Serum: 566 ng/mL — ABNORMAL HIGH (ref 0.0–4.0)

## 2019-03-15 NOTE — Telephone Encounter (Signed)
Called and left msg. Mailed printout  °

## 2019-03-16 ENCOUNTER — Telehealth: Payer: Self-pay

## 2019-03-16 NOTE — Telephone Encounter (Signed)
Returned patient call and made him aware that per Dr. Alen Blew there is no need to take the salt tablets. Patient verbalized understanding.

## 2019-03-16 NOTE — Telephone Encounter (Signed)
-----   Message from Wyatt Portela, MD sent at 03/15/2019  4:38 PM EDT ----- Regarding: RE: pt question No need to take anymore. Thanks.  ----- Message ----- From: Scot Dock, RN Sent: 03/15/2019   4:15 PM EDT To: Wyatt Portela, MD Subject: pt question                                    Pt states he has been taking a salt tab 1 gram prescribed by his [place] MD. He would like to know if you want him to continue taking and if so he will need a new script. Thanks

## 2019-03-18 ENCOUNTER — Other Ambulatory Visit: Payer: Self-pay | Admitting: Oncology

## 2019-03-18 ENCOUNTER — Other Ambulatory Visit: Payer: Self-pay

## 2019-03-18 DIAGNOSIS — C61 Malignant neoplasm of prostate: Secondary | ICD-10-CM

## 2019-03-18 DIAGNOSIS — C7951 Secondary malignant neoplasm of bone: Secondary | ICD-10-CM

## 2019-03-18 MED ORDER — PROCHLORPERAZINE MALEATE 10 MG PO TABS: 10.0000 mg | ORAL_TABLET | Freq: Four times a day (QID) | ORAL | 0 refills | Status: DC | PRN

## 2019-03-18 NOTE — Telephone Encounter (Signed)
-----   Message from Wyatt Portela, MD sent at 03/18/2019 10:07 AM EDT ----- Regarding: RE: patient concerns Please send an Rx for compazine 10 q6 prn #30. I will refer him to radiation for his lymph nodes.  Thanks.  ----- Message ----- From: Scot Dock, RN Sent: 03/18/2019   9:35 AM EDT To: Wyatt Portela, MD Subject: patient concerns                               He states that he has had nausea x 2 days and has used OTC emetrol that calms it down a bit but he has constant underlying nausea. He also has 4 more lymph nodes on his neck that are enlarged and tender to touch since his last appt with you. No fever or other changes at this time.

## 2019-03-18 NOTE — Telephone Encounter (Signed)
Contacted patient and left message of new prescription and referral and to call back with any questions or concerns.

## 2019-03-21 ENCOUNTER — Encounter: Payer: Self-pay | Admitting: Radiation Oncology

## 2019-03-21 NOTE — Progress Notes (Signed)
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 Noted left supraclavicular adenopathy three days ago. Reports these areas are only painful if touched.   Past/Anticipated chemotherapy by medical oncology, if any:  Previous therapy:  Status post biopsy done on July of 2015 followed by lymph node dissection completed in 2015 at [facility] [place].  He was treated there with definitive radiation therapy utilizing seed implant as well as external beam radiation completed in 2016.  He developed advanced disease with bone involvement.  He received radiation therapy to the pelvis in 2017.  Xtandi 160 mg daily that was poorly tolerated and was discontinued.  He was switched to [name] which she took until 2019 and developed progression of disease with bony metastasis as well as PSA of 80.  Taxotere chemotherapy at 75 mg/m cycle 1 given in March 2019.  He is status post 6 cycles of chemotherapy last cycle given on February 03, 2018.   Status post clinical trial utilizing Darolutamide as well as LuPSAM a part of a clinical trial at [facility] in [place].  He is status post 4 cycles of therapy.  Last cycle given on October 01, 2018.  Therapy discontinued as a progression of disease.   Current therapy:   Lupron 30 mg every 4 months. Last dose May 2020 next dose due 2019/05/18.   Xgeva 120 mg every 4 weeks.   Lynparza 300 mg twice daily started in July 2020.  Patient's main complaints related to symptomatic tumor(s) are: Periodic pain in his skull and back. Taking Morphine long acting at night only  Plus MSIR every four hours prn.  Pain on a scale of 0-10 is: Denies pain at this time. Reports periodic pain in skull and back improved greatly after radiation therapy in June. Patient  reports he can manage this discomfort mostly with tylenol and ibuprofen. Reports pain in supraclavicular areas only with palpation. Also, endorses taking Morphine long acting before bed and MSIR prn.      If Spine Met(s), symptoms, if any, include:  Bowel/Bladder retention or incontinence (please describe): Reports constipation from morphine is under control now. Patient denies any urinary issues at this time.   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 Reports a generalized weakness has developed on his left side. Reports this weakness began in April 2020.   Current Decadron regimen, if applicable: denies   Ambulatory status? Walker? Wheelchair?: ambulatory   SAFETY ISSUES:  Prior radiation? yes  Pacemaker/ICD? no  Possible current pregnancy? No, male patient  Is the patient on methotrexate? no  Additional Complaints / other details:  63 year old male. Divorced with one child. Reports he received his last Lupron injection in May and will be due for the next "if he receives it" in 05-18-2023. Father died of prostate cancer.

## 2019-03-22 ENCOUNTER — Other Ambulatory Visit: Payer: Self-pay

## 2019-03-22 ENCOUNTER — Ambulatory Visit
Admission: RE | Admit: 2019-03-22 | Discharge: 2019-03-22 | Disposition: A | Discharge status: Home / Self Care | Payer: BC Managed Care – PPO | Source: Ambulatory Visit | Attending: Radiation Oncology | Admitting: Radiation Oncology

## 2019-03-22 ENCOUNTER — Encounter: Payer: Self-pay | Admitting: Radiation Oncology

## 2019-03-22 VITALS — Ht 70.0 in | Wt 218.0 lb

## 2019-03-22 DIAGNOSIS — C770 Secondary and unspecified malignant neoplasm of lymph nodes of head, face and neck: Secondary | ICD-10-CM

## 2019-03-22 DIAGNOSIS — C7951 Secondary malignant neoplasm of bone: Secondary | ICD-10-CM

## 2019-03-22 DIAGNOSIS — C61 Malignant neoplasm of prostate: Secondary | ICD-10-CM

## 2019-03-22 NOTE — Progress Notes (Signed)
Radiation Oncology         [phone] ________________________________  Initial outpatient Consultation - Conducted via MyChart due to current COVID-19 concerns for limiting patient exposure  Name: Alejandro [name]. MRN: [id number]  Date: 03/22/2019  DOB: 1955-10-24  XH:BZJIR, [name], MD  Wyatt Portela, MD   REFERRING PHYSICIAN: Wyatt Portela, MD  DIAGNOSIS: 63 y.o. 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    ICD-10-CM   1. Clinical stage T1c N1 M0 adenocarcinoma of the prostate with a Gleason's score of 4+4 and a PSA of 6.4  C61   2. Prostate cancer metastatic to bone ([place])  C61    C79.51   3. Secondary and unspecified malignant neoplasm of lymph nodes of head, face and neck (HCC)  C77.0     HISTORY OF PRESENT ILLNESS: Alejandro [name]. is a 63 y.o. male with a diagnosis of metastatic prostate cancer. He was initially diagnosed with advanced, high risk, Gleason 8 adenocarcinoma of the prostate with a PSA of 6.4 and inguinal and pelvic lymphadenopathy noted at the time of diagnosis. He was started on Lupron ADT and referred for consult in the multidisciplinary prostate clinic on 04/14/2014.    He elected to proceed with treatment at at [facility] where he underwent pelvic lymph node dissection on 08/14/2014, which showed 3/18 lymph nodes positive for metastatic disease. He opted to proceed with external beam radiation with upfront brachytherapy boost in February to March 2016 at [facility].  Unfortunately, his PSA became detectable at 0.43 on 11/09/2015. Prostate MRI and PET scan performed on 02/28/2016 showed disease progression with new bony disease in the right iliac bone and, new right mesorectal lymphadenopathy.  He received 3 Fxs of SBRT to a total dose of 27 Gy to the right  iliac bone lesion in 03/2016.  Systemic therapy was initiated with Gillermina Phy which he did not tolerate and he was switched to [facility] which he was taking until 2019 but eventually discontinued due to disease progression with a PSA of 80.  He has also remained on Lupron ADT every 4 months.  He opted to transfer his care back to [facility] to proceed with systemic therapy locally under the care and direction of Dr. Alen Blew in 11/2017.  He completed 6 of 6 cycles of docetaxel with neulasta from 10/22/2017 - 02/03/2018, which was also discontinued secondary to toxicity as well as a rising PSA despite treatment.  He underwent another right groin lymph node biopsy on 05/04/2018, which was again positive for metastatic disease.  His PSA was elevated at 1255 in November 2019.  At that time, he elected to enroll in a clinical trial at [facility] where he completed 4 cycles of Darolutamide from 05/19/2018 - 09/07/2018, discontinued early due to the COVID-19 pandemic resulting in significant travel limitations.  His PSA was decreased to 85 in February 2020 but back up to 359 in May 2020.  On 01/13/2019, he presented to the local ED with balance issues and dizziness and was admitted and treated for SIADH and Afib.  He returned back to [facility] for follow-up in the end of June 2020 and while he was there, developed headaches, weakness and severe low back pain resulting in a visit to the emergency department where he was noted to have a skull base lesion at the occiput as well as  spinal metastases and T11-L5 which were treated with palliative radiotherapy.  Towards the end of 02/2019, he elected to proceed with further systemic therapy with [place] but only tolerated this for about a week and then discontinued due to toxicities. His PSA was further elevated to 566 at the time of his most recent visit with Dr. Alen Blew on 03/14/2019.  At the time of that visit, he reported progressive palpable adenopathy of the right  supraclavicular region, for approximately 10 days and since that time, has noticed progressive enlargement on the left for the past 3 days. He states these areas are only painful with palpation but appear to be rapidly enlarging.  Of note, his most recent systemic imaging with CT chest abdomen pelvis in May 2020 demonstrated extensive metastatic disease in the abdominopelvic lymph nodes and throughout the bony skeleton.  His most recent imaging with MRI brain and thoracic MRI were performed at [facility] on 12/30/2018.  The patient has kindly been referred today for discussion of potential palliative radiation treatment to the enlarging, bilateral [place] nodes.  PREVIOUS RADIATION THERAPY: Yes  09/27/14: Prostate Brachytherapy / 100 Gy [id number] implant  10/31/14 - 12/04/14: Prostate +Pelvis EBRT / 45 Gy in 25 fractions (Dr. Rutherford Limerick)  03/17/16 - 03/19/16: Right Iliac Metastasis / 27 Gy in 3 fractions  01/04/2019 - 01/08/2019: Right Occipital Mass  01/06/2019 - 01/10/2019: Right T11-L5  PAST MEDICAL HISTORY:  Past Medical History:  Diagnosis Date   ANXIETY    AORTIC STENOSIS, MILD    CHEST PAIN-UNSPECIFIED    DYSPNEA    HYPERCHOLESTEROLEMIA    Hypertension    INSOMNIA    OSA on CPAP 05/31/2013   OSTEOARTHRITIS, KNEE    Prostate cancer ([place])       PAST SURGICAL HISTORY: Past Surgical History:  Procedure Laterality Date   COLONOSCOPY     ELBOW SURGERY     LUMBAR LAMINECTOMY  Late 1990's   NEUROPLASTY / TRANSPOSITION MEDIAN NERVE AT CARPAL TUNNEL BILATERAL     PROSTATE BIOPSY      FAMILY HISTORY:  Family History  Problem Relation Age of Onset   Prostate cancer Father    Breast cancer Neg Hx    Colon cancer Neg Hx     SOCIAL HISTORY:  Social History   Socioeconomic History   Marital status: Divorced    Spouse name: Not on file   Number of children: 1   Years of education: college   Highest education level: Not on file  Occupational History   Occupation:  CONSULTANT    Employer: [employer]    Comment: No longer working  Scientist, product/process development strain: Not on file   Food insecurity    Worry: Not on file    Inability: Not on Lexicographer needs    Medical: Not on file    Non-medical: Not on file  Tobacco Use   Smoking status: Former Smoker    Packs/day: 3.00    Years: 25.00    Pack years: 75.00    Types: Cigars, Cigarettes    Quit date: 12/03/1998    Years since quitting: 20.3   Smokeless tobacco: Never Used   Tobacco comment: Former 3 ppd smoker for 30 years quit heavy smoking 15 years ago  Substance and Sexual Activity   Alcohol use: Yes    Alcohol/week: 12.0 standard drinks    Types: 12 Standard drinks or equivalent per week    Comment: 2 drinks per day  Drug use: Not Currently    Types: Cocaine    Comment: in his 75s   Sexual activity: Yes  Lifestyle   Physical activity    Days per week: Not on file    Minutes per session: Not on file   Stress: Not on file  Relationships   Social connections    Talks on phone: Not on file    Gets together: Not on file    Attends religious service: Not on file    Active member of club or organization: Not on file    Attends meetings of clubs or organizations: Not on file    Relationship status: Not on file   Intimate partner violence    Fear of current or ex partner: Not on file    Emotionally abused: Not on file    Physically abused: Not on file    Forced sexual activity: Not on file  Other Topics Concern   Not on file  Social History Narrative   Not on file    ALLERGIES: Codeine  MEDICATIONS:  Current Outpatient Medications  Medication Sig Dispense Refill   acetaminophen (TYLENOL) 325 MG tablet Take 650 mg by mouth every 6 (six) hours as needed for moderate pain.     amitriptyline (ELAVIL) 10 MG tablet Take 20 mg by mouth at bedtime.      ezetimibe (ZETIA) 10 MG tablet Take 10 mg by mouth daily.     ibuprofen (ADVIL) 600 MG  tablet Take 600 mg by mouth every 6 (six) hours as needed for moderate pain.     losartan (COZAAR) 50 MG tablet Take 50 mg by mouth daily.      morphine (MS CONTIN) 15 MG 12 hr tablet Take 1 tablet (15 mg total) by mouth 2 (two) times daily as needed for pain. (Patient taking differently: Take 15 mg by mouth. 1-2 at bedtime) 60 tablet 0   morphine (MSIR) 15 MG tablet Take 1 tablet (15 mg total) by mouth every 4 (four) hours as needed for severe pain. 30 tablet 0   prochlorperazine (COMPAZINE) 10 MG tablet Take 1 tablet (10 mg total) by mouth every 6 (six) hours as needed for nausea or vomiting. 30 tablet 0   tamsulosin (FLOMAX) 0.4 MG CAPS capsule TAKE ONE CAPSULE BY MOUTH TWICE DAILY 60 capsule 0   calcium-vitamin D (OSCAL WITH D) 500-200 MG-UNIT tablet Take 1 tablet by mouth 2 (two) times daily. (Patient not taking: Reported on 03/22/2019) 90 tablet 3   No current facility-administered medications for this encounter.     REVIEW OF SYSTEMS:  On review of systems, the patient reports that he is doing well overall. He denies any chest pain, shortness of breath, cough, fevers, chills, night sweats, unintended weight changes. He denies any bowel disturbances, and denies abdominal pain, nausea or vomiting. He denies pain in the enlarged [place] nodes at this time but reports periodic pain in his skull and back, which is greatly improved since completing radiation treatments in 01/2019. He reports tenderness to the supraclavicular areas only with palpation. He endorses using tylenol and ibuprofen, as well as morphine before bed. He reports occasional constipation related to the morphine which is controlled with laxatives ad stool softeners prn. 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 A complete review of systems is obtained and is otherwise negative.  PHYSICAL EXAM:  Wt Readings from Last 3 Encounters:  03/22/19 218 lb (98.9 kg)  03/14/19 225 lb 2 oz (102.1 kg)  03/09/19 229 lb 3.2 oz (104 kg)   Temp Readings from Last 3 Encounters:  03/14/19 97.8 F (36.6 C) (Tympanic)  02/14/19 98.3 F (36.8 C) (Tympanic)  01/17/19 98.2 F (36.8 C) (Oral)   BP Readings from Last 3 Encounters:  03/14/19 128/84  03/09/19 (!) 143/89  02/14/19 127/85   Pulse Readings from Last 3 Encounters:  03/14/19 90  03/09/19 75  02/14/19 63   Pain Assessment Pain Score: 0-No pain/10  In general this is a well appearing Caucasian gentleman in no acute distress. He's alert and oriented x4 and appropriate throughout the examination. Cardiopulmonary assessment is negative for acute distress and he exhibits normal effort. He was able to show [place] his enlarged neck lymph nodes on his phone video camera. The involved right [place] node is approximately 5 cm, and the one on the left is approximately 2.5 cm.  KPS = 80  100 - Normal; no complaints; no evidence of disease. 90   - Able to carry on normal activity; minor signs or symptoms of disease. 80   - Normal activity with effort; some signs or symptoms of disease. 35   - Cares for self; unable to carry on normal activity or to do active work. 60   - Requires occasional assistance, but is able to care for most of his personal needs. 50   - Requires considerable assistance and frequent medical care. 59   - Disabled; requires special care and assistance. 57   - Severely disabled; hospital admission is indicated although death not imminent. 37   - Very sick; hospital admission necessary; active supportive treatment necessary. 10   - Moribund; fatal processes progressing rapidly. 0     - Dead  Karnofsky DA, Abelmann McLean, Craver LS and Burchenal [facility] [phone]) The use of the nitrogen mustards in the palliative treatment of carcinoma: with particular reference to bronchogenic carcinoma Cancer 1 634-56  LABORATORY DATA:  Lab Results  Component Value Date   WBC 5.8 03/14/2019    HGB 11.0 (L) 03/14/2019   HCT 33.4 (L) 03/14/2019   MCV 93.3 03/14/2019   PLT 295 03/14/2019   Lab Results  Component Value Date   NA 138 03/14/2019   K 3.7 03/14/2019   CL 101 03/14/2019   CO2 23 03/14/2019   Lab Results  Component Value Date   ALT 8 03/14/2019   AST 14 (L) 03/14/2019   ALKPHOS 87 03/14/2019   BILITOT 0.7 03/14/2019     RADIOGRAPHY: No results found.    IMPRESSION/PLAN: 1. 63 y.o. 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 This visit was conducted via MyChart to spare the patient unnecessary potential exposure in the healthcare setting during the current COVID-19 pandemic. Today, we talked to the patient about the findings and workup thus far.  We discussed the natural history of metastatic prostate carcinoma and general treatment, highlighting the role of palliative radiotherapy in the management of progressive disease in the lymph nodes. We discussed the available radiation techniques, and focused on the details of logistics and delivery. The recommendation is for a 10 day course of daily radiotherapy delivered to the bilateral [place] nodes over a period of 2 weeks. We reviewed the anticipated acute and late sequelae associated with radiation in this setting. The patient was  encouraged to ask questions that were answered to his satisfaction.  At the conclusion of our conversation, the patient elects to proceed with palliative radiation therapy to the enlarging bilateral supraclavicular nodes. He is scheduled for CT simulation on Thursday, 03/24/2019, at 11 am in anticipation of beginning treatments in the near future.  Given current concerns for patient exposure during the COVID-19 pandemic, this encounter was conducted via video-enabled MyChart visit. The patient has given verbal consent for this  type of encounter. The time spent during this encounter was 55 minutes. The attendants for this meeting include Tyler Pita MD, Ashlyn Bruning PA-C, [place], and patient Alejandro [name]. During the encounter, Tyler Pita MD, Ashlyn Bruning PA-C, and scribe, Wilburn Mylar were located at [place].  Patient Alejandro [name] was located at home.    Nicholos Johns, PA-C    Tyler Pita, MD  [facility] Direct Dial: [phone]   Fax: [phone] [email or web address]   Skype   [email or web address]   This document serves as a record of services personally performed by Tyler Pita, MD and Freeman Caldron, PA-C. It was created on their behalf by Wilburn Mylar, a trained medical scribe. The creation of this record is based on the scribe's personal observations and the provider's statements to them. This document has been checked and approved by the attending provider.

## 2019-03-24 ENCOUNTER — Ambulatory Visit
Admission: RE | Admit: 2019-03-24 | Discharge: 2019-03-24 | Disposition: A | Discharge status: Home / Self Care | Payer: BC Managed Care – PPO | Source: Ambulatory Visit | Attending: Radiation Oncology | Admitting: Radiation Oncology

## 2019-03-24 ENCOUNTER — Other Ambulatory Visit: Payer: Self-pay

## 2019-03-24 DIAGNOSIS — C61 Malignant neoplasm of prostate: Secondary | ICD-10-CM

## 2019-03-24 DIAGNOSIS — C778 Secondary and unspecified malignant neoplasm of lymph nodes of multiple regions: Secondary | ICD-10-CM | POA: Diagnosis not present

## 2019-03-24 DIAGNOSIS — C770 Secondary and unspecified malignant neoplasm of lymph nodes of head, face and neck: Secondary | ICD-10-CM

## 2019-03-24 DIAGNOSIS — Z510 Encounter for antineoplastic radiation therapy: Secondary | ICD-10-CM | POA: Insufficient documentation

## 2019-03-24 DIAGNOSIS — C7951 Secondary malignant neoplasm of bone: Secondary | ICD-10-CM

## 2019-03-27 DIAGNOSIS — C770 Secondary and unspecified malignant neoplasm of lymph nodes of head, face and neck: Secondary | ICD-10-CM | POA: Insufficient documentation

## 2019-03-27 NOTE — Progress Notes (Signed)
  Radiation Oncology         [phone]) [phone] ________________________________  Name: Alejandro [name]. MRN: [id number]  Date: 03/24/2019  DOB: [birth year]  SIMULATION AND TREATMENT PLANNING NOTE    ICD-10-CM   1. Prostate cancer metastatic to bone ([place])  C61    C79.51   2. Clinical stage T1c N1 M0 adenocarcinoma of the prostate with a Gleason's score of 4+4 and a PSA of 6.4  C61   3. Secondary and unspecified malignant neoplasm of lymph nodes of head, face and neck (HCC)  C77.0     DIAGNOSIS:  63 yo man with castration resistant metastatic prostate cancer involving supraclavicular lymph nodes.  NARRATIVE:  The patient was brought to the [place].  Identity was confirmed.  All relevant records and images related to the planned course of therapy were reviewed.  The patient freely provided informed written consent to proceed with treatment after reviewing the details related to the planned course of therapy. The consent form was witnessed and verified by the simulation staff.  Then, the patient was set-up in a stable reproducible  supine position for radiation therapy.  CT images were obtained.  Surface markings were placed.  The CT images were loaded into the planning software.  Then the target and avoidance structures were contoured.  Treatment planning then occurred.  The radiation prescription was entered and confirmed.  Then, I designed and supervised the construction of a total of multiple medically necessary complex treatment devices including mask for positioning and MLC apertures to shield spinal cord.  I have requested : 3D Simulation  I have requested a DVH of the following structures: spinal cord, lungs and target.  PLAN:  The patient will receive 30 Gy in 10 fractions.  ________________________________  Sheral Apley Tammi Klippel, M.D.

## 2019-03-29 ENCOUNTER — Other Ambulatory Visit: Payer: Self-pay | Admitting: Oncology

## 2019-03-29 DIAGNOSIS — C7951 Secondary malignant neoplasm of bone: Secondary | ICD-10-CM

## 2019-03-29 DIAGNOSIS — R4182 Altered mental status, unspecified: Secondary | ICD-10-CM

## 2019-03-29 DIAGNOSIS — C61 Malignant neoplasm of prostate: Secondary | ICD-10-CM

## 2019-03-30 ENCOUNTER — Other Ambulatory Visit: Payer: Self-pay

## 2019-03-30 ENCOUNTER — Encounter (HOSPITAL_COMMUNITY): Payer: Self-pay

## 2019-03-30 ENCOUNTER — Inpatient Hospital Stay (HOSPITAL_COMMUNITY)
Admission: EM | Admit: 2019-03-30 | Discharge: 2019-04-03 | DRG: 948 | Disposition: A | Discharge status: Home Health | Payer: BC Managed Care – PPO | Attending: Family Medicine | Admitting: Family Medicine

## 2019-03-30 ENCOUNTER — Emergency Department (HOSPITAL_COMMUNITY): Payer: BC Managed Care – PPO

## 2019-03-30 ENCOUNTER — Telehealth: Payer: Self-pay

## 2019-03-30 DIAGNOSIS — I700 Atherosclerosis of aorta: Secondary | ICD-10-CM | POA: Diagnosis present

## 2019-03-30 DIAGNOSIS — I359 Nonrheumatic aortic valve disorder, unspecified: Secondary | ICD-10-CM | POA: Diagnosis present

## 2019-03-30 DIAGNOSIS — Z66 Do not resuscitate: Secondary | ICD-10-CM | POA: Diagnosis present

## 2019-03-30 DIAGNOSIS — C7931 Secondary malignant neoplasm of brain: Secondary | ICD-10-CM | POA: Diagnosis present

## 2019-03-30 DIAGNOSIS — R1084 Generalized abdominal pain: Secondary | ICD-10-CM | POA: Diagnosis not present

## 2019-03-30 DIAGNOSIS — G936 Cerebral edema: Secondary | ICD-10-CM

## 2019-03-30 DIAGNOSIS — C7951 Secondary malignant neoplasm of bone: Secondary | ICD-10-CM | POA: Diagnosis present

## 2019-03-30 DIAGNOSIS — R5081 Fever presenting with conditions classified elsewhere: Secondary | ICD-10-CM | POA: Diagnosis present

## 2019-03-30 DIAGNOSIS — I951 Orthostatic hypotension: Secondary | ICD-10-CM | POA: Diagnosis present

## 2019-03-30 DIAGNOSIS — I272 Pulmonary hypertension, unspecified: Secondary | ICD-10-CM | POA: Diagnosis present

## 2019-03-30 DIAGNOSIS — I4891 Unspecified atrial fibrillation: Secondary | ICD-10-CM | POA: Diagnosis present

## 2019-03-30 DIAGNOSIS — C770 Secondary and unspecified malignant neoplasm of lymph nodes of head, face and neck: Secondary | ICD-10-CM | POA: Diagnosis present

## 2019-03-30 DIAGNOSIS — R7302 Impaired glucose tolerance (oral): Secondary | ICD-10-CM | POA: Diagnosis present

## 2019-03-30 DIAGNOSIS — C778 Secondary and unspecified malignant neoplasm of lymph nodes of multiple regions: Secondary | ICD-10-CM | POA: Diagnosis present

## 2019-03-30 DIAGNOSIS — R52 Pain, unspecified: Secondary | ICD-10-CM

## 2019-03-30 DIAGNOSIS — E785 Hyperlipidemia, unspecified: Secondary | ICD-10-CM | POA: Diagnosis present

## 2019-03-30 DIAGNOSIS — Z87891 Personal history of nicotine dependence: Secondary | ICD-10-CM

## 2019-03-30 DIAGNOSIS — D709 Neutropenia, unspecified: Secondary | ICD-10-CM | POA: Diagnosis present

## 2019-03-30 DIAGNOSIS — G893 Neoplasm related pain (acute) (chronic): Secondary | ICD-10-CM | POA: Diagnosis not present

## 2019-03-30 DIAGNOSIS — R591 Generalized enlarged lymph nodes: Secondary | ICD-10-CM

## 2019-03-30 DIAGNOSIS — E7800 Pure hypercholesterolemia, unspecified: Secondary | ICD-10-CM | POA: Diagnosis present

## 2019-03-30 DIAGNOSIS — C61 Malignant neoplasm of prostate: Secondary | ICD-10-CM | POA: Diagnosis present

## 2019-03-30 DIAGNOSIS — Z885 Allergy status to narcotic agent status: Secondary | ICD-10-CM | POA: Diagnosis not present

## 2019-03-30 DIAGNOSIS — G4733 Obstructive sleep apnea (adult) (pediatric): Secondary | ICD-10-CM | POA: Diagnosis present

## 2019-03-30 DIAGNOSIS — Z20828 Contact with and (suspected) exposure to other viral communicable diseases: Secondary | ICD-10-CM | POA: Diagnosis present

## 2019-03-30 DIAGNOSIS — IMO0002 Reserved for concepts with insufficient information to code with codable children: Secondary | ICD-10-CM | POA: Diagnosis present

## 2019-03-30 DIAGNOSIS — M1710 Unilateral primary osteoarthritis, unspecified knee: Secondary | ICD-10-CM | POA: Diagnosis present

## 2019-03-30 DIAGNOSIS — Z8042 Family history of malignant neoplasm of prostate: Secondary | ICD-10-CM | POA: Diagnosis not present

## 2019-03-30 DIAGNOSIS — I10 Essential (primary) hypertension: Secondary | ICD-10-CM | POA: Diagnosis present

## 2019-03-30 DIAGNOSIS — I350 Nonrheumatic aortic (valve) stenosis: Secondary | ICD-10-CM | POA: Diagnosis present

## 2019-03-30 LAB — CBC
HCT: 32.5 % — ABNORMAL LOW (ref 39.0–52.0)
Hemoglobin: 10.2 g/dL — ABNORMAL LOW (ref 13.0–17.0)
MCH: 29.4 pg (ref 26.0–34.0)
MCHC: 31.4 g/dL (ref 30.0–36.0)
MCV: 93.7 fL (ref 80.0–100.0)
Platelets: 274 10*3/uL (ref 150–400)
RBC: 3.47 MIL/uL — ABNORMAL LOW (ref 4.22–5.81)
RDW: 15.3 % (ref 11.5–15.5)
WBC: 9.3 10*3/uL (ref 4.0–10.5)
nRBC: 0 % (ref 0.0–0.2)

## 2019-03-30 LAB — COMPREHENSIVE METABOLIC PANEL
ALT: 10 U/L (ref 0–44)
AST: 18 U/L (ref 15–41)
Albumin: 3.6 g/dL (ref 3.5–5.0)
Alkaline Phosphatase: 89 U/L (ref 38–126)
Anion gap: 11 (ref 5–15)
BUN: 11 mg/dL (ref 8–23)
CO2: 25 mmol/L (ref 22–32)
Calcium: 8.7 mg/dL — ABNORMAL LOW (ref 8.9–10.3)
Chloride: 99 mmol/L (ref 98–111)
Creatinine, Ser: 0.79 mg/dL (ref 0.61–1.24)
GFR calc Af Amer: >60 mL/min (ref >60)
GFR calc non Af Amer: >60 mL/min (ref >60)
Glucose, Bld: 200 mg/dL — ABNORMAL HIGH (ref 70–99)
Potassium: 3.6 mmol/L (ref 3.5–5.1)
Sodium: 135 mmol/L (ref 135–145)
Total Bilirubin: 0.6 mg/dL (ref 0.3–1.2)
Total Protein: 7.3 g/dL (ref 6.5–8.1)

## 2019-03-30 LAB — URINALYSIS, ROUTINE W REFLEX MICROSCOPIC
Bacteria, UA: NONE SEEN
Bilirubin Urine: NEGATIVE
Glucose, UA: NEGATIVE mg/dL
Hgb urine dipstick: NEGATIVE
Ketones, ur: NEGATIVE mg/dL
Leukocytes,Ua: NEGATIVE
Nitrite: NEGATIVE
Protein, ur: 30 mg/dL — AB
Specific Gravity, Urine: 1.015 (ref 1.005–1.030)
pH: 5 (ref 5.0–8.0)

## 2019-03-30 LAB — LIPASE, BLOOD: Lipase: 20 U/L (ref 11–51)

## 2019-03-30 MED ORDER — LEVETIRACETAM 500 MG PO TABS
500.0000 mg | ORAL_TABLET | Freq: Two times a day (BID) | ORAL | Status: DC
  Administered 2019-03-30 – 2019-04-03 (×8): 500 mg via ORAL
  Filled 2019-03-30 (×8): qty 1

## 2019-03-30 MED ORDER — SODIUM CHLORIDE 0.9 % IV BOLUS
1000.0000 mL | Freq: Once | INTRAVENOUS | Status: AC
  Administered 2019-03-30: 1000 mL via INTRAVENOUS

## 2019-03-30 MED ORDER — MELATONIN 3 MG PO TABS
12.0000 mg | ORAL_TABLET | Freq: Every day | ORAL | Status: DC
  Administered 2019-03-30 – 2019-04-02 (×4): 12 mg via ORAL
  Filled 2019-03-30 (×5): qty 4

## 2019-03-30 MED ORDER — MORPHINE SULFATE ER 15 MG PO TBCR
15.0000 mg | EXTENDED_RELEASE_TABLET | Freq: Every day | ORAL | Status: DC
  Administered 2019-03-30: 30 mg via ORAL
  Filled 2019-03-30: qty 2

## 2019-03-30 MED ORDER — ONDANSETRON HCL 4 MG/2ML IJ SOLN
4.0000 mg | Freq: Once | INTRAMUSCULAR | Status: AC
  Administered 2019-03-30: 4 mg via INTRAVENOUS
  Filled 2019-03-30: qty 2

## 2019-03-30 MED ORDER — HYDROMORPHONE HCL 2 MG/ML IJ SOLN
2.0000 mg | Freq: Once | INTRAMUSCULAR | Status: AC
  Administered 2019-03-30: 2 mg via INTRAVENOUS
  Filled 2019-03-30: qty 1

## 2019-03-30 MED ORDER — SODIUM CHLORIDE 0.9% FLUSH
3.0000 mL | Freq: Once | INTRAVENOUS | Status: AC
  Administered 2019-03-30: 3 mL via INTRAVENOUS

## 2019-03-30 MED ORDER — TAMSULOSIN HCL 0.4 MG PO CAPS
0.4000 mg | ORAL_CAPSULE | Freq: Two times a day (BID) | ORAL | Status: DC
  Administered 2019-03-30 – 2019-04-03 (×8): 0.4 mg via ORAL
  Filled 2019-03-30 (×8): qty 1

## 2019-03-30 MED ORDER — SODIUM CHLORIDE (PF) 0.9 % IJ SOLN
INTRAMUSCULAR | Status: AC
  Filled 2019-03-30: qty 50

## 2019-03-30 MED ORDER — HYDROMORPHONE HCL 2 MG/ML IJ SOLN: 2.0000 mg | INTRAMUSCULAR | Status: DC | PRN

## 2019-03-30 MED ORDER — ENOXAPARIN SODIUM 40 MG/0.4ML ~~LOC~~ SOLN
40.0000 mg | SUBCUTANEOUS | Status: DC
  Administered 2019-03-30 – 2019-03-31 (×2): 40 mg via SUBCUTANEOUS
  Filled 2019-03-30 (×2): qty 0.4

## 2019-03-30 MED ORDER — HYDROMORPHONE HCL 1 MG/ML IJ SOLN
2.0000 mg | INTRAMUSCULAR | Status: DC | PRN
  Administered 2019-03-30 – 2019-03-31 (×5): 2 mg via INTRAVENOUS
  Filled 2019-03-30 (×5): qty 2

## 2019-03-30 MED ORDER — IOHEXOL 300 MG/ML  SOLN
100.0000 mL | Freq: Once | INTRAMUSCULAR | Status: AC | PRN
  Administered 2019-03-30: 100 mL via INTRAVENOUS

## 2019-03-30 MED ORDER — DEXAMETHASONE 4 MG PO TABS
4.0000 mg | ORAL_TABLET | Freq: Four times a day (QID) | ORAL | Status: DC
  Administered 2019-03-30 – 2019-04-02 (×11): 4 mg via ORAL
  Filled 2019-03-30 (×10): qty 1

## 2019-03-30 MED ORDER — HYDROMORPHONE HCL 1 MG/ML IJ SOLN
1.0000 mg | Freq: Once | INTRAMUSCULAR | Status: AC
  Administered 2019-03-30: 15:00:00 1 mg via INTRAVENOUS
  Filled 2019-03-30: qty 1

## 2019-03-30 MED ORDER — DEXAMETHASONE SODIUM PHOSPHATE 4 MG/ML IJ SOLN
4.0000 mg | Freq: Once | INTRAMUSCULAR | Status: AC
  Administered 2019-03-30: 4 mg via INTRAVENOUS
  Filled 2019-03-30: qty 1

## 2019-03-30 MED ORDER — PROCHLORPERAZINE MALEATE 10 MG PO TABS
10.0000 mg | ORAL_TABLET | Freq: Four times a day (QID) | ORAL | Status: DC | PRN
  Administered 2019-04-01: 10 mg via ORAL
  Filled 2019-03-30: qty 1

## 2019-03-30 NOTE — ED Notes (Signed)
Patient in radiology

## 2019-03-30 NOTE — ED Provider Notes (Signed)
[facility] DEPT Provider Note   CSN: [id number] Arrival date & time: 03/30/19  1151     History   Chief Complaint Chief Complaint  Patient presents with   Abdominal Pain    HPI Alejandro [name]. is a 63 y.o. male.     Pt presents to the ED today with abdominal pain.  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   He is not currently on any treatment.  He has enlarged lymph nodes in his scalp and in his head.  He has had to take morphine more frequently for his bony mets to his back.  He has been having bowel movements.  No n/v.  No f/c.  The pt has a poor prognosis with limited life expectancy.  He developed abdominal pain in the LUQ last night.  He took an extra dose of morphine which did not help.  He is followed by Dr. Alen Blew.  He also has a headache and has equilibrium problems like when he had the scalp lesion pressing into his brain in the past.            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[place]) 03/27/2019   Atrial fibrillation ([place]) 01/13/2019   Orthostatic hypotension 12/03/2018   Abdominal aortic atherosclerosis ([place]): Severe calcification and ectasia 12/03/2018   Encounter for antineoplastic chemotherapy 12/04/2017   Prostate cancer metastatic to bone ([name]) 11/19/2017   Fever in adult 11/19/2017   Mucositis due to antineoplastic therapy 11/19/2017   Lactic acid acidosis 11/19/2017   Fever 11/19/2017   Neutropenic fever ([place]) 10/30/2017   Clinical stage T1c N1 M0 adenocarcinoma of the  prostate with a Gleason's score of 4+4 and a PSA of 6.4 03/21/2014   OSA on CPAP 05/31/2013   DYSPNEA 01/23/2010   CHEST PAIN-UNSPECIFIED 01/23/2010   ANXIETY 12/10/2009   OSTEOARTHRITIS, KNEE 12/10/2009   INSOMNIA 12/10/2009   HYPERCHOLESTEROLEMIA 12/07/2009   Essential hypertension 12/07/2009   AORTIC STENOSIS, MILD 12/07/2009   CAD, native coronary artery: Occluded RCA (CTO) 2009 10/13/2007    Past Surgical History:  Procedure Laterality Date   COLONOSCOPY     ELBOW SURGERY     LUMBAR LAMINECTOMY  Late 1990's   NEUROPLASTY / TRANSPOSITION MEDIAN NERVE AT CARPAL TUNNEL BILATERAL     PROSTATE BIOPSY          Home Medications    Prior to Admission medications   Medication Sig Start Date End Date Taking? Authorizing Provider  calcium-vitamin D (OSCAL WITH D) 500-200 MG-UNIT tablet Take 1 tablet by mouth 2 (two) times daily. 03/16/18  Yes Wyatt Portela, MD  ezetimibe (ZETIA) 10 MG tablet Take 10 mg by mouth daily.   Yes [provider]  losartan (COZAAR) 50 MG tablet Take 50 mg by mouth daily.  12/04/17  Yes [provider]  Melatonin 12 MG TABS Take 12 mg by mouth at bedtime.   Yes [provider]  morphine (MS CONTIN) 15 MG 12  hr tablet Take 1 tablet (15 mg total) by mouth 2 (two) times daily as needed for pain. Patient taking differently: Take 15-30 mg by mouth at bedtime.  02/25/19  Yes Wyatt Portela, MD  morphine (MSIR) 15 MG tablet Take 1 tablet (15 mg total) by mouth every 4 (four) hours as needed for severe pain. 03/14/19  Yes Wyatt Portela, MD  prochlorperazine (COMPAZINE) 10 MG tablet Take 1 tablet (10 mg total) by mouth every 6 (six) hours as needed for nausea or vomiting. 03/18/19  Yes Wyatt Portela, MD  tamsulosin (FLOMAX) 0.4 MG CAPS capsule TAKE ONE CAPSULE BY MOUTH TWICE DAILY Patient taking differently: Take 0.4 mg by mouth 2 (two) times daily.  02/28/19  Yes Wyatt Portela, MD    Family History Family History    Problem Relation Age of Onset   Prostate cancer Father    Breast cancer Neg Hx    Colon cancer Neg Hx     Social History Social History   Tobacco Use   Smoking status: Former Smoker    Packs/day: 3.00    Years: 25.00    Pack years: 75.00    Types: Cigars, Cigarettes    Quit date: 12/03/1998    Years since quitting: 20.3   Smokeless tobacco: Never Used   Tobacco comment: Former 3 ppd smoker for 30 years quit heavy smoking 15 years ago  Substance Use Topics   Alcohol use: Yes    Alcohol/week: 12.0 standard drinks    Types: 12 Standard drinks or equivalent per week    Comment: 2 drinks per day   Drug use: Not Currently    Types: Cocaine    Comment: in his 25s     Allergies   Codeine   Review of Systems Review of Systems  Gastrointestinal: Positive for abdominal pain.  All other systems reviewed and are negative.    Physical Exam Updated Vital Signs BP (!) 180/94    Pulse 80    Temp 98.9 F (37.2 C) (Oral)    Resp 16    Ht '5\' 10"'  (1.778 m)    Wt 97.5 kg    SpO2 97%    BMI 30.85 kg/m   Physical Exam Vitals signs and nursing note reviewed.  Constitutional:      Appearance: He is well-developed.  HENT:     Head:     Comments: Enlarged LNs in scalp    Mouth/Throat:     Comments: Enlarged LNs in neck Eyes:     Extraocular Movements: Extraocular movements intact.     Pupils: Pupils are equal, round, and reactive to light.  Cardiovascular:     Rate and Rhythm: Normal rate and regular rhythm.     Heart sounds: Normal heart sounds.  Pulmonary:     Effort: Pulmonary effort is normal.     Breath sounds: Normal breath sounds.  Abdominal:     General: Abdomen is flat.     Palpations: Abdomen is soft.     Tenderness: There is abdominal tenderness in the left upper quadrant.  Skin:    General: Skin is warm.     Capillary Refill: Capillary refill takes less than 2 seconds.  Neurological:     General: No focal deficit present.     Mental Status: He is  alert and oriented to person, place, and time.  Psychiatric:        Mood and Affect: Mood normal.        Behavior: Behavior  normal.      ED Treatments / Results  Labs (all labs ordered are listed, but only abnormal results are displayed) Labs Reviewed  COMPREHENSIVE METABOLIC PANEL - Abnormal; Notable for the following components:      Result Value   Glucose, Bld 200 (*)    Calcium 8.7 (*)    All other components within normal limits  CBC - Abnormal; Notable for the following components:   RBC 3.47 (*)    Hemoglobin 10.2 (*)    HCT 32.5 (*)    All other components within normal limits  URINALYSIS, ROUTINE W REFLEX MICROSCOPIC - Abnormal; Notable for the following components:   Protein, ur 30 (*)    All other components within normal limits  SARS CORONAVIRUS 2 (TAT 6-12 HRS)  LIPASE, BLOOD    EKG None  Radiology Ct Head Wo Contrast  Result Date: 03/30/2019 CLINICAL DATA:  Altered level of consciousness. History of metastatic prostate cancer. Known right occipital mass treated with radiation therapy in 01/2019. EXAM: CT HEAD WITHOUT CONTRAST TECHNIQUE: Contiguous axial images were obtained from the base of the skull through the vertex without intravenous contrast. COMPARISON:  12/29/2017 head CT. 12/23/2018 nuclear medicine whole body bone scan. FINDINGS: Brain: There is an approximately 3.2 x 1.8 x 3.2 cm mass in the posterior right occipital region which is heterogeneously hyperdense which may reflect mineralization versus petechial type hemorrhage. It is not completely clear whether the mass is extra-axial or intra-axial although the former is favored (potentially with brain invasion) given sclerosis involving the overlying skull. There is moderately extensive vasogenic edema in the right occipital lobe and posterior temporal lobe with regional sulcal effacement and mass effect on the right lateral ventricle. There is no midline shift. No acute cortically based infarct or  extra-axial fluid collection is identified. Cerebral atrophy is mildly age advanced. Hypodensities in the cerebral white matter bilaterally are nonspecific but compatible with mild chronic small vessel ischemic disease. Vascular: Calcified atherosclerosis at the skull base. No hyperdense vessel. Skull: Multiple sclerotic skull lesions consistent with metastases including a large area of right parieto-occipital skull involvement. Sinuses/Orbits: Opacification of a single posterior left ethmoid air cell. Unremarkable orbits. Other: Small right frontal scalp lipoma. IMPRESSION: 1. 3.2 cm right occipital mass with moderately extensive vasogenic edema consistent with metastatic disease. Regional mass effect without midline shift. 2. Multiple skull metastases. Electronically Signed   By: Logan Bores M.D.   On: 03/30/2019 16:29   Ct Soft Tissue Neck W Contrast  Result Date: 03/30/2019 CLINICAL DATA:  Neck mass, afebrile. History of prostate cancer with metastatic disease. EXAM: CT NECK WITH CONTRAST TECHNIQUE: Multidetector CT imaging of the neck was performed using the standard protocol following the bolus administration of intravenous contrast. CONTRAST:  19m OMNIPAQUE IOHEXOL 300 MG/ML  SOLN COMPARISON:  CT head 12/29/2017 FINDINGS: Pharynx and larynx: Normal. No mass or swelling. Salivary glands: No inflammation, mass, or stone. Thyroid: Negative Lymph nodes: Numerous lymph nodes in the neck, left greater than right. Largest lymph node is left supraclavicular, level 4 node measuring 28 x 34 mm. Additional multiple subcentimeter nodes left lower neck. Necrotic lymph node on the right measures 15 mm posteriorly, level 5 in the mid neck. Additional small level 5 nodes bilaterally Vascular: Normal vascular enhancement.  Mild atherosclerotic disease Limited intracranial: Enhancing mass lesion in the right occipital parietal region. This is a peripheral mass lesion with extensive vasogenic edema in the right occipital  lobe. There is associated sclerotic bony metastasis in the  overlying right parietal bone. Visualized orbits: Negative Mastoids and visualized paranasal sinuses: Mild mucosal edema paranasal sinuses. Negative orbit Skeleton: Sclerotic lesions in the cervical spine and sternum compatible with metastatic disease. Sclerotic lesion right parietal bone compatible with metastatic disease. Upper chest: Lung apices clear bilaterally appear Other: None IMPRESSION: Cervical lymphadenopathy left greater than right compatible with metastatic disease. Largest left supraclavicular node measures 28 x 34 mm. Skeletal metastatic disease cervical spine, sternum, right parietal bone Enhancing mass lesion right occipital parietal lobe. Peripheral mass lesion is likely associated with bony metastatic disease extending into the dura. There is considerable white matter edema. No brain mass identified on the prior CT head 12/29/2017. Electronically Signed   By: Franchot Gallo M.D.   On: 03/30/2019 16:16   Ct Chest W Contrast  Result Date: 03/30/2019 CLINICAL DATA:  Left upper quadrant pain since last night. History of metastatic prostate cancer. EXAM: CT CHEST, ABDOMEN, AND PELVIS WITH CONTRAST TECHNIQUE: Multidetector CT imaging of the chest, abdomen and pelvis was performed following the standard protocol during bolus administration of intravenous contrast. CONTRAST:  125m OMNIPAQUE IOHEXOL 300 MG/ML  SOLN COMPARISON:  CT chest, abdomen, and pelvis dated Dec 22, 2018. FINDINGS: CT CHEST FINDINGS Cardiovascular: Normal heart size. No pericardial effusion. No thoracic aortic aneurysm or dissection. Coronary, aortic arch, and branch vessel atherosclerotic vascular disease. No central pulmonary embolism. Mediastinum/Nodes: Enlarging left supraclavicular lymph nodes measuring up to 1.8 cm, previously 10 mm. New mildly enlarged right paratracheal lymph node measuring 1.2 cm in short axis. Unchanged enlarged retrocrural lymph nodes  measuring up to 15 mm. No enlarged axillary or hilar lymph nodes. The thyroid gland, trachea, and esophagus are unremarkable. Lungs/Pleura: Subsegmental atelectasis in both lower lobes. No focal consolidation, pleural effusion, or pneumothorax. Unchanged 10 mm ground-glass nodule in the left lower lobe (series 9, image 111). Musculoskeletal: Bilateral gynecomastia. Sclerotic lesions noted in the sternum, ribs, left scapula, and thoracic spine are not significantly changed. Old bilateral rib fractures again noted. CT ABDOMEN PELVIS FINDINGS Hepatobiliary: No focal liver abnormality is seen. No gallstones, gallbladder wall thickening, or biliary dilatation. Pancreas: Unremarkable. No pancreatic ductal dilatation or surrounding inflammatory changes. Spleen: Normal in size without focal abnormality. Adrenals/Urinary Tract: Adrenal glands are unremarkable. Kidneys are normal, without renal calculi, focal lesion, or hydronephrosis. Bladder is decompressed. Stomach/Bowel: Stomach is within normal limits. Appendix is normal. No evidence of bowel wall thickening, distention, or inflammatory changes. Mild left-sided colonic diverticulosis again noted. Vascular/Lymphatic: Bulky lymphadenopathy in the upper abdomen and retroperitoneum has increased in size. For example, a hepatoduodenal ligament lymph node now measures 4.3 cm in short axis, previously 2.5 cm. Right inguinal lymph nodes have increased in size, now measuring up to 2.5 cm, previously 1.6 cm. The left external iliac lymph node now measures 1.6 cm in short axis, previously 0.7 cm. Aortic atherosclerosis. Reproductive: Brachytherapy seeds again noted in the prostate gland. Other: Increased mild presacral stranding. Unchanged small bilateral fat containing inguinal hernias. No free fluid or pneumoperitoneum. Musculoskeletal: Sclerotic lesions in the lumbar spine and both iliac bones are grossly unchanged. IMPRESSION: 1.  No acute intrathoracic or intra-abdominal  process. 2. Progressive left supraclavicular, upper abdominal, retroperitoneal, and right inguinal nodal metastatic disease. 3. Grossly unchanged osseous metastases. 4. Unchanged 10 mm ground-glass nodule in the left lower lobe. Initial follow-up with CT in 9 months is recommended to confirm persistence. If persistent, repeat CT is recommended every 2 years until 5 years of stability has been established. This recommendation follows the consensus statement:  Guidelines for Management of Incidental Pulmonary Nodules Detected on CT Images: From the Fleischner Society 2017; Radiology 2017; 284:228-243. 5.  Aortic atherosclerosis (ICD10-I70.0). Electronically Signed   By: Titus Dubin M.D.   On: 03/30/2019 16:25   Ct Abdomen Pelvis W Contrast  Result Date: 03/30/2019 CLINICAL DATA:  Left upper quadrant pain since last night. History of metastatic prostate cancer. EXAM: CT CHEST, ABDOMEN, AND PELVIS WITH CONTRAST TECHNIQUE: Multidetector CT imaging of the chest, abdomen and pelvis was performed following the standard protocol during bolus administration of intravenous contrast. CONTRAST:  136m OMNIPAQUE IOHEXOL 300 MG/ML  SOLN COMPARISON:  CT chest, abdomen, and pelvis dated Dec 22, 2018. FINDINGS: CT CHEST FINDINGS Cardiovascular: Normal heart size. No pericardial effusion. No thoracic aortic aneurysm or dissection. Coronary, aortic arch, and branch vessel atherosclerotic vascular disease. No central pulmonary embolism. Mediastinum/Nodes: Enlarging left supraclavicular lymph nodes measuring up to 1.8 cm, previously 10 mm. New mildly enlarged right paratracheal lymph node measuring 1.2 cm in short axis. Unchanged enlarged retrocrural lymph nodes measuring up to 15 mm. No enlarged axillary or hilar lymph nodes. The thyroid gland, trachea, and esophagus are unremarkable. Lungs/Pleura: Subsegmental atelectasis in both lower lobes. No focal consolidation, pleural effusion, or pneumothorax. Unchanged 10 mm ground-glass  nodule in the left lower lobe (series 9, image 111). Musculoskeletal: Bilateral gynecomastia. Sclerotic lesions noted in the sternum, ribs, left scapula, and thoracic spine are not significantly changed. Old bilateral rib fractures again noted. CT ABDOMEN PELVIS FINDINGS Hepatobiliary: No focal liver abnormality is seen. No gallstones, gallbladder wall thickening, or biliary dilatation. Pancreas: Unremarkable. No pancreatic ductal dilatation or surrounding inflammatory changes. Spleen: Normal in size without focal abnormality. Adrenals/Urinary Tract: Adrenal glands are unremarkable. Kidneys are normal, without renal calculi, focal lesion, or hydronephrosis. Bladder is decompressed. Stomach/Bowel: Stomach is within normal limits. Appendix is normal. No evidence of bowel wall thickening, distention, or inflammatory changes. Mild left-sided colonic diverticulosis again noted. Vascular/Lymphatic: Bulky lymphadenopathy in the upper abdomen and retroperitoneum has increased in size. For example, a hepatoduodenal ligament lymph node now measures 4.3 cm in short axis, previously 2.5 cm. Right inguinal lymph nodes have increased in size, now measuring up to 2.5 cm, previously 1.6 cm. The left external iliac lymph node now measures 1.6 cm in short axis, previously 0.7 cm. Aortic atherosclerosis. Reproductive: Brachytherapy seeds again noted in the prostate gland. Other: Increased mild presacral stranding. Unchanged small bilateral fat containing inguinal hernias. No free fluid or pneumoperitoneum. Musculoskeletal: Sclerotic lesions in the lumbar spine and both iliac bones are grossly unchanged. IMPRESSION: 1.  No acute intrathoracic or intra-abdominal process. 2. Progressive left supraclavicular, upper abdominal, retroperitoneal, and right inguinal nodal metastatic disease. 3. Grossly unchanged osseous metastases. 4. Unchanged 10 mm ground-glass nodule in the left lower lobe. Initial follow-up with CT in 9 months is  recommended to confirm persistence. If persistent, repeat CT is recommended every 2 years until 5 years of stability has been established. This recommendation follows the consensus statement: Guidelines for Management of Incidental Pulmonary Nodules Detected on CT Images: From the Fleischner Society 2017; Radiology 2017; 284:228-243. 5.  Aortic atherosclerosis (ICD10-I70.0). Electronically Signed   By: WTitus DubinM.D.   On: 03/30/2019 16:25    Procedures Procedures (including critical care time)  Medications Ordered in ED Medications  sodium chloride (PF) 0.9 % injection (has no administration in time range)  HYDROmorphone (DILAUDID) injection 2 mg (has no administration in time range)  dexamethasone (DECADRON) injection 4 mg (has no administration in time range)  sodium chloride flush (NS) 0.9 % injection 3 mL (3 mLs Intravenous Given 03/30/19 1506)  sodium chloride 0.9 % bolus 1,000 mL (1,000 mLs Intravenous New Bag/Given 03/30/19 1506)  HYDROmorphone (DILAUDID) injection 1 mg (1 mg Intravenous Given 03/30/19 1506)  ondansetron (ZOFRAN) injection 4 mg (4 mg Intravenous Given 03/30/19 1506)  iohexol (OMNIPAQUE) 300 MG/ML solution 100 mL (100 mLs Intravenous Contrast Given 03/30/19 1530)     Initial Impression / Assessment and Plan / ED Course  I have reviewed the triage vital signs and the nursing notes.  Pertinent labs & imaging results that were available during my care of the patient were reviewed by me and considered in my medical decision making (see chart for details).       Pt is still having pain, so additional dilaudid given.  Pain is likely due to the significant mets in pt's bone and lymph nodes.  Due to the skull met with vasogenic edema, I spoke with Dr. Lindi Adie.  He recommended decadron 4 mg po bid.  Either he or Dr. Alen Blew will see pt in the morning.  Pt d/w Dr. Verlon Au ([facility]) for admission.   Final Clinical Impressions(s) / ED Diagnoses   Final diagnoses:   Generalized abdominal pain  Prostate cancer metastatic to bone ([place])  Intractable pain  Vasogenic edema ([place])  Diffuse lymphadenopathy    ED Discharge Orders    None       Isla Pence, MD 03/30/19 1719

## 2019-03-30 NOTE — Final Progress Note (Deleted)
HPI  [facility] BJY:[id number] DOB: November 05, 1955 DOA: 03/30/2019  PCP: [name], MD   Chief Complaint: Multiple  HPI:  63 year old Caucasian male Metastatic prostate cancer diagnosed 02/2014 [facility] + XRT 2000 16/2017 poor tolerance of chemotherapy and clinical trial concluded in [place] 10/01/2018 secondary to progression of disease-treated earlier this summer back in [place] for metastatic bony mets to the head-treated with XRT Apparently supposed to get head neck treatment in the next week or so under the care of Dr. Tammi Klippel with XRT  currently Lupron Calla Kicks  Other medical issues include Atrial fibrillation last EF 66% mild pulmonary hypertension Pancytopenia, hypertension, hyperlipidemia  Comes to emergency room 8/26 intractable abdominal pain, 10/10-supposed to be on morphine every 3 hours 15 mg which he is taking 2 to 3 tablets had a severe paroxysm early a.m. decided to come here to get it worked up-CT scans as below Labs relatively benign  In addition patient also for the past 2/52 increasing weakness, dizziness, blurred vision-2-3 episodes falls last one on 8/22-uses walker or cane-  ED called and spoke with Dr. Duard Larsen of his symptoms with dizziness and enlargement of area on hand admission recommended for pain control, Rx cerebral metastases   ED Course: Received Decadron, Dilaudid 1 mg Zofran saline bolus 1000 cc  Review of Systems:  Negative for fever, chills, melena, vomit, unilateral weakness, seizure, fall, + Weakness, intractable abdominal pain,  Past Medical History:  Diagnosis Date  . ANXIETY   . AORTIC STENOSIS, MILD   . CHEST PAIN-UNSPECIFIED   . DYSPNEA   . HYPERCHOLESTEROLEMIA   . Hypertension   . INSOMNIA   . OSA on CPAP 05/31/2013  . OSTEOARTHRITIS, KNEE   . Prostate cancer [facility])     Past Surgical History:  Procedure Laterality Date  . COLONOSCOPY    . ELBOW SURGERY    . LUMBAR LAMINECTOMY  Late  1990's  . NEUROPLASTY / TRANSPOSITION MEDIAN NERVE AT CARPAL TUNNEL BILATERAL    . PROSTATE BIOPSY       reports that he quit smoking about 20 years ago. His smoking use included cigars and cigarettes. He has a 75.00 pack-year smoking history. He has never used smokeless tobacco. He reports current alcohol use of about 12.0 standard drinks of alcohol per week. He reports previous drug use. Drug: Cocaine. Mobility: Independent but uses walker occasionally Previously used to be an executive for company quit in 2018  Allergies  Allergen Reactions  . Codeine Nausea Only    Family History  Problem Relation Age of Onset  . Prostate cancer Father   . Breast cancer Neg Hx   . Colon cancer Neg Hx      Prior to Admission medications   Medication Sig Start Date End Date Taking? Authorizing Provider  calcium-vitamin D (OSCAL WITH D) 500-200 MG-UNIT tablet Take 1 tablet by mouth 2 (two) times daily. 03/16/18  Yes Wyatt Portela, MD  ezetimibe (ZETIA) 10 MG tablet Take 10 mg by mouth daily.   Yes [provider]  losartan (COZAAR) 50 MG tablet Take 50 mg by mouth daily.  12/04/17  Yes [provider]  Melatonin 12 MG TABS Take 12 mg by mouth at bedtime.   Yes [provider]  morphine (MS CONTIN) 15 MG 12 hr tablet Take 1 tablet (15 mg total) by mouth 2 (two) times daily as needed for pain. Patient taking differently: Take 15-30 mg by mouth at bedtime.  02/25/19  Yes Zola Button  N, MD  morphine (MSIR) 15 MG tablet Take 1 tablet (15 mg total) by mouth every 4 (four) hours as needed for severe pain. 03/14/19  Yes Wyatt Portela, MD  prochlorperazine (COMPAZINE) 10 MG tablet Take 1 tablet (10 mg total) by mouth every 6 (six) hours as needed for nausea or vomiting. 03/18/19  Yes Wyatt Portela, MD  tamsulosin (FLOMAX) 0.4 MG CAPS capsule TAKE ONE CAPSULE BY MOUTH TWICE DAILY Patient taking differently: Take 0.4 mg by mouth 2 (two) times daily.  02/28/19  Yes Wyatt Portela,  MD    Physical Exam:  Vitals:   03/30/19 1503 03/30/19 1737  BP: (!) 180/94 (!) 181/94  Pulse: 80 86  Resp: 16 18  Temp:    SpO2: 97% 97%     Awake alert coherent no distress Mallampati 4  No JVD  Chest clear  Abdomen slightly tender in left lower quadrant postsurgical change  Power 5/5 bilaterally major muscle groups power symmetric uvula midline shoulder shrug bilaterally equal sensory grossly intact further exam deferred  Rectal deferred  No lower extremity edema  Skin soft supple no rash  I have personally reviewed following labs and imaging studies  Labs:       Imaging studies:   BUN/creatinine 11/0.7 lipase 20 LFTs normal  Hemoglobin 10.2 baseline 11 no white count platelets normal  Medical tests:   EKG independently reviewed:   None performed  Test discussed with performing physician:  He has discussed in detail with Dr. Gilford Raid  Decision to obtain old records:   Yes extensively reviewed  Review and summation of old records:   Yes  Active Problems:   Essential hypertension   Aortic valve disorder   Osteoarthrosis involving lower leg   Clinical stage T1c N1 M0 adenocarcinoma of the prostate with a Gleason's score of 4+4 and a PSA of 6.4   Neutropenic fever (HCC)   Prostate cancer metastatic to bone (HCC)   Orthostatic hypotension   Secondary and unspecified malignant neoplasm of lymph nodes of head, face and neck (HCC)   Cancer related pain   Cancer associated pain   Assessment/Plan Metastatic prostate cancer with intractable cancer related pain Change morphine MSIR every 4 to Dilaudid 2 mg IV every 4 as needed-okay to continue MS Contin extended release tonight  Aim to transition to oral Dilaudid probably in the a.m. dependent on how patient does on IV Dilaudid Because of brain mets we will start Decadron 4 mg every 6 and await further input from oncologist-we will place also on prophylactic Keppra 500 twice daily at this time which  can be discontinued at their discretion Oncologist and radiation oncologist will be CCed on this note as it appears he was supposed to start in the very near future head and neck XRT which may palliate the enlarged area on CT of the head that may be causing some of his issues Dizziness Obtain orthostatics some of her symptoms could be from the brain met but I noticed that he is on various medications that can cause this as well-I have discontinued losartan 50 completely Atrial fibrillation chads score 2 Keep on monitors not on any anticoagulation prior to admission Hyperlipidemia-no mortality benefit given advanced metastatic cancer-discontinued    Severity of Illness: The appropriate patient status for this patient is INPATIENT. Inpatient status is judged to be reasonable and necessary in order to provide the required intensity of service to ensure the patient's safety. The patient's presenting symptoms, physical exam findings, and  initial radiographic and laboratory data in the context of their chronic comorbidities is felt to place them at high risk for further clinical deterioration. Furthermore, it is not anticipated that the patient will be medically stable for discharge from the hospital within 2 midnights of admission. The following factors support the patient status of inpatient.   " The patient's presenting symptoms include intractable pain. " The worrisome physical exam findings include abdominal pain dizziness. " The initial radiographic and laboratory data are worrisome because of possible risk of seizure. " The chronic co-morbidities include metastatic cancer.   * I certify that at the point of admission it is my clinical judgment that the patient will require inpatient hospital care spanning beyond 2 midnights from the point of admission due to high intensity of service, high risk for further deterioration and high frequency of surveillance required.*     DVT prophylaxis:  Lovenox Code Status: DNR was confirmed in detail with the patient at bedside Family Communication: None Consults called: Radiation oncology was consulted by me oncologist was consulted by ED  Time spent: 30 minutes  Tevis Conger, MD  [facility] --[address] [place]  --[email or web address]; password [email or web address]  7PM-7AM contact night coverage as above  03/30/2019, 5:41 PM

## 2019-03-30 NOTE — ED Triage Notes (Addendum)
Pt states LUQ pain since last night. Pt states he took his morphine, slept some, then woke up with pain. Pt states he took another this morning without relief.  Denies diarrhea. Pt states it feels like there's something "in the pit of my stomach" .  Pt has prostate CA with mets. Oral chemo last 2 weeks ago.

## 2019-03-30 NOTE — Telephone Encounter (Signed)
Received call from the patient stating that he has been having severe abdominal pain since last night and the pain medications are ineffective. He stated that he is going to the ED. Dr. Alen Blew made aware.

## 2019-03-31 ENCOUNTER — Other Ambulatory Visit: Payer: Self-pay | Admitting: Oncology

## 2019-03-31 ENCOUNTER — Ambulatory Visit
Admission: RE | Admit: 2019-03-31 | Discharge: 2019-03-31 | Disposition: A | Discharge status: Home / Self Care | Payer: BC Managed Care – PPO | Source: Ambulatory Visit | Attending: Radiation Oncology | Admitting: Radiation Oncology

## 2019-03-31 DIAGNOSIS — R1084 Generalized abdominal pain: Secondary | ICD-10-CM

## 2019-03-31 DIAGNOSIS — C7951 Secondary malignant neoplasm of bone: Secondary | ICD-10-CM

## 2019-03-31 DIAGNOSIS — C61 Malignant neoplasm of prostate: Secondary | ICD-10-CM

## 2019-03-31 LAB — COMPREHENSIVE METABOLIC PANEL
ALT: 8 U/L (ref 0–44)
AST: 13 U/L — ABNORMAL LOW (ref 15–41)
Albumin: 3.3 g/dL — ABNORMAL LOW (ref 3.5–5.0)
Alkaline Phosphatase: 82 U/L (ref 38–126)
Anion gap: 11 (ref 5–15)
BUN: 10 mg/dL (ref 8–23)
CO2: 24 mmol/L (ref 22–32)
Calcium: 9.2 mg/dL (ref 8.9–10.3)
Chloride: 100 mmol/L (ref 98–111)
Creatinine, Ser: 0.64 mg/dL (ref 0.61–1.24)
GFR calc Af Amer: >60 mL/min (ref >60)
GFR calc non Af Amer: >60 mL/min (ref >60)
Glucose, Bld: 179 mg/dL — ABNORMAL HIGH (ref 70–99)
Potassium: 3.9 mmol/L (ref 3.5–5.1)
Sodium: 135 mmol/L (ref 135–145)
Total Bilirubin: 0.5 mg/dL (ref 0.3–1.2)
Total Protein: 7.2 g/dL (ref 6.5–8.1)

## 2019-03-31 LAB — CBC
HCT: 30.3 % — ABNORMAL LOW (ref 39.0–52.0)
Hemoglobin: 9.5 g/dL — ABNORMAL LOW (ref 13.0–17.0)
MCH: 29.7 pg (ref 26.0–34.0)
MCHC: 31.4 g/dL (ref 30.0–36.0)
MCV: 94.7 fL (ref 80.0–100.0)
Platelets: 284 10*3/uL (ref 150–400)
RBC: 3.2 MIL/uL — ABNORMAL LOW (ref 4.22–5.81)
RDW: 15.2 % (ref 11.5–15.5)
WBC: 8.3 10*3/uL (ref 4.0–10.5)
nRBC: 0 % (ref 0.0–0.2)

## 2019-03-31 LAB — SARS CORONAVIRUS 2 (TAT 6-24 HRS): SARS Coronavirus 2: NEGATIVE

## 2019-03-31 MED ORDER — MORPHINE SULFATE ER 15 MG PO TBCR
15.0000 mg | EXTENDED_RELEASE_TABLET | Freq: Every day | ORAL | Status: DC
  Administered 2019-03-31: 15 mg via ORAL
  Filled 2019-03-31: qty 1

## 2019-03-31 MED ORDER — HYDROMORPHONE HCL 1 MG/ML PO LIQD
6.0000 mg | ORAL | Status: DC | PRN
  Administered 2019-03-31 – 2019-04-03 (×16): 6 mg via ORAL
  Filled 2019-03-31 (×26): qty 6

## 2019-03-31 MED ORDER — HYDROMORPHONE HCL 2 MG PO TABS: 6.0000 mg | ORAL_TABLET | ORAL | Status: DC | PRN

## 2019-03-31 MED ORDER — MORPHINE SULFATE ER 15 MG PO TBCR: 15.0000 mg | EXTENDED_RELEASE_TABLET | Freq: Two times a day (BID) | ORAL | Status: DC

## 2019-03-31 NOTE — Progress Notes (Signed)
IP PROGRESS NOTE  Subjective:   Events overnight noted.  Alejandro [name] is known to me with history of advanced prostate cancer and has progressed on multiple therapies and currently on supportive care only.  He has declined any further therapy regarding his prostate cancer.  He was hospitalized overnight with increased dizziness headaches and increased abdominal pain.  CT scan of the head, chest abdomen and pelvis showed an enhancing mass in the right occipital lobe with vasogenic edema and mass-effect without midline shift.  He also was found to have worsening adenopathy with progression in his hepatoduodenal lymph nodes as well as cervical lymphadenopathy.  Overnight, he was started on steroids with intravenous dexamethasone with improvement in his mentation slightly and potentially improving his dizziness.  He was also started on IV Dilaudid which have helped his pain that is manageable at this time.  He denied seizures.  Denies any fevers, chills or sweats.  Denied chest pain, palpitation, orthopnea or leg edema.  Denied cough, wheezing or hemoptysis.  Denied nausea, vomiting.  Denies any constipation or diarrhea.  Denies any frequency urgency or hesitancy.  Denies any arthralgias or myalgias.  Denies any skin rashes or lesions.  Denies any bleeding or clotting tendency.  Denies any easy bruising.  Denies any hair or nail changes.  Denies any anxiety or depression.  Remaining review of system is negative.   Objective:  Temp:  [97.7 F (36.5 C)-99 F (37.2 C)] 97.7 F (36.5 C) (08/27 0612) Pulse Rate:  [76-91] 76 (08/27 0612) Resp:  [15-18] 18 (08/27 0612) BP: (129-181)/(76-94) 154/93 (08/27 0612) SpO2:  [92 %-100 %] 95 % (08/27 0612) Weight:  [215 lb (97.5 kg)-220 lb 10.9 oz (100.1 kg)] 220 lb 10.9 oz (100.1 kg) (08/26 1903)   General: Alert, awake without distress. Head: Normocephalic atraumatic. Mouth: mucous membranes moist, pharynx normal without lesions Eyes: No scleral  icterus.  Pupils are equal and round reactive to light. Resp: clear to auscultation bilaterally without rhonchi or wheezes or dullness to percussion. Cardio: Irregular without edema. GI: soft, non-tender; bowel sounds normal; no masses,  no organomegaly Musculoskeletal: No joint deformity or effusion. Neurological: No motor, sensory deficits.  Intact deep tendon reflexes. Skin: No rashes or lesions.  Current Facility-Administered Medications on File Prior to Visit  Medication Dose Route Frequency Provider Last Rate Last Dose  . dexamethasone (DECADRON) tablet 4 mg  4 mg Oral Q6H Nita Sells, MD   4 mg at 03/31/19 0505  . enoxaparin (LOVENOX) injection 40 mg  40 mg Subcutaneous Q24H Nita Sells, MD   40 mg at 03/30/19 2125  . HYDROmorphone (DILAUDID) injection 2 mg  2 mg Intravenous Q3H PRN Nita Sells, MD   2 mg at 03/31/19 0505  . levETIRAcetam (KEPPRA) tablet 500 mg  500 mg Oral BID Nita Sells, MD   500 mg at 03/30/19 2125  . Melatonin TABS 12 mg  12 mg Oral QHS Nita Sells, MD   12 mg at 03/30/19 2126  . morphine (MS CONTIN) 12 hr tablet 15-30 mg  15-30 mg Oral QHS Nita Sells, MD   30 mg at 03/30/19 2125  . prochlorperazine (COMPAZINE) tablet 10 mg  10 mg Oral Q6H PRN Nita Sells, MD      . tamsulosin (FLOMAX) capsule 0.4 mg  0.4 mg Oral BID Nita Sells, MD   0.4 mg at 03/30/19 2125   Current Outpatient Medications on File Prior to Visit  Medication Sig Dispense Refill  . calcium-vitamin D (OSCAL WITH D)  500-200 MG-UNIT tablet Take 1 tablet by mouth 2 (two) times daily. 90 tablet 3  . ezetimibe (ZETIA) 10 MG tablet Take 10 mg by mouth daily.    Marland Kitchen losartan (COZAAR) 50 MG tablet Take 50 mg by mouth daily.     . Melatonin 12 MG TABS Take 12 mg by mouth at bedtime.    Marland Kitchen morphine (MS CONTIN) 15 MG 12 hr tablet Take 1 tablet (15 mg total) by mouth 2 (two) times daily as needed for pain. (Patient taking differently: Take  15-30 mg by mouth at bedtime. ) 60 tablet 0  . morphine (MSIR) 15 MG tablet Take 1 tablet (15 mg total) by mouth every 4 (four) hours as needed for severe pain. 30 tablet 0  . prochlorperazine (COMPAZINE) 10 MG tablet Take 1 tablet (10 mg total) by mouth every 6 (six) hours as needed for nausea or vomiting. 30 tablet 0  . tamsulosin (FLOMAX) 0.4 MG CAPS capsule TAKE 1 CAPSULE BY MOUTH TWICE DAILY 60 capsule 0      Lab Results: Recent Labs    03/30/19 1223 03/31/19 0515  WBC 9.3 8.3  HGB 10.2* 9.5*  HCT 32.5* 30.3*  PLT 274 284    BMET Recent Labs    03/30/19 1223 03/31/19 0515  NA 135 135  K 3.6 3.9  CL 99 100  CO2 25 24  GLUCOSE 200* 179*  BUN 11 10  CREATININE 0.79 0.64  CALCIUM 8.7* 9.2    Studies/Results: Ct Head Wo Contrast  Result Date: 03/30/2019 CLINICAL DATA:  Altered level of consciousness. History of metastatic prostate cancer. Known right occipital mass treated with radiation therapy in 01/2019. EXAM: CT HEAD WITHOUT CONTRAST TECHNIQUE: Contiguous axial images were obtained from the base of the skull through the vertex without intravenous contrast. COMPARISON:  12/29/2017 head CT. 12/23/2018 nuclear medicine whole body bone scan. FINDINGS: Brain: There is an approximately 3.2 x 1.8 x 3.2 cm mass in the posterior right occipital region which is heterogeneously hyperdense which may reflect mineralization versus petechial type hemorrhage. It is not completely clear whether the mass is extra-axial or intra-axial although the former is favored (potentially with brain invasion) given sclerosis involving the overlying skull. There is moderately extensive vasogenic edema in the right occipital lobe and posterior temporal lobe with regional sulcal effacement and mass effect on the right lateral ventricle. There is no midline shift. No acute cortically based infarct or extra-axial fluid collection is identified. Cerebral atrophy is mildly age advanced. Hypodensities in the  cerebral white matter bilaterally are nonspecific but compatible with mild chronic small vessel ischemic disease. Vascular: Calcified atherosclerosis at the skull base. No hyperdense vessel. Skull: Multiple sclerotic skull lesions consistent with metastases including a large area of right parieto-occipital skull involvement. Sinuses/Orbits: Opacification of a single posterior left ethmoid air cell. Unremarkable orbits. Other: Small right frontal scalp lipoma. IMPRESSION: 1. 3.2 cm right occipital mass with moderately extensive vasogenic edema consistent with metastatic disease. Regional mass effect without midline shift. 2. Multiple skull metastases. Electronically Signed   By: Logan Bores M.D.   On: 03/30/2019 16:29   Ct Soft Tissue Neck W Contrast  Result Date: 03/30/2019 CLINICAL DATA:  Neck mass, afebrile. History of prostate cancer with metastatic disease. EXAM: CT NECK WITH CONTRAST TECHNIQUE: Multidetector CT imaging of the neck was performed using the standard protocol following the bolus administration of intravenous contrast. CONTRAST:  114mL OMNIPAQUE IOHEXOL 300 MG/ML  SOLN COMPARISON:  CT head 12/29/2017 FINDINGS: Pharynx and larynx:  Normal. No mass or swelling. Salivary glands: No inflammation, mass, or stone. Thyroid: Negative Lymph nodes: Numerous lymph nodes in the neck, left greater than right. Largest lymph node is left supraclavicular, level 4 node measuring 28 x 34 mm. Additional multiple subcentimeter nodes left lower neck. Necrotic lymph node on the right measures 15 mm posteriorly, level 5 in the mid neck. Additional small level 5 nodes bilaterally Vascular: Normal vascular enhancement.  Mild atherosclerotic disease Limited intracranial: Enhancing mass lesion in the right occipital parietal region. This is a peripheral mass lesion with extensive vasogenic edema in the right occipital lobe. There is associated sclerotic bony metastasis in the overlying right parietal bone. Visualized  orbits: Negative Mastoids and visualized paranasal sinuses: Mild mucosal edema paranasal sinuses. Negative orbit Skeleton: Sclerotic lesions in the cervical spine and sternum compatible with metastatic disease. Sclerotic lesion right parietal bone compatible with metastatic disease. Upper chest: Lung apices clear bilaterally appear Other: None IMPRESSION: Cervical lymphadenopathy left greater than right compatible with metastatic disease. Largest left supraclavicular node measures 28 x 34 mm. Skeletal metastatic disease cervical spine, sternum, right parietal bone Enhancing mass lesion right occipital parietal lobe. Peripheral mass lesion is likely associated with bony metastatic disease extending into the dura. There is considerable white matter edema. No brain mass identified on the prior CT head 12/29/2017. Electronically Signed   By: Franchot Gallo M.D.   On: 03/30/2019 16:16   Ct Chest W Contrast  Result Date: 03/30/2019 CLINICAL DATA:  Left upper quadrant pain since last night. History of metastatic prostate cancer. EXAM: CT CHEST, ABDOMEN, AND PELVIS WITH CONTRAST TECHNIQUE: Multidetector CT imaging of the chest, abdomen and pelvis was performed following the standard protocol during bolus administration of intravenous contrast. CONTRAST:  168mL OMNIPAQUE IOHEXOL 300 MG/ML  SOLN COMPARISON:  CT chest, abdomen, and pelvis dated Dec 22, 2018. FINDINGS: CT CHEST FINDINGS Cardiovascular: Normal heart size. No pericardial effusion. No thoracic aortic aneurysm or dissection. Coronary, aortic arch, and branch vessel atherosclerotic vascular disease. No central pulmonary embolism. Mediastinum/Nodes: Enlarging left supraclavicular lymph nodes measuring up to 1.8 cm, previously 10 mm. New mildly enlarged right paratracheal lymph node measuring 1.2 cm in short axis. Unchanged enlarged retrocrural lymph nodes measuring up to 15 mm. No enlarged axillary or hilar lymph nodes. The thyroid gland, trachea, and esophagus  are unremarkable. Lungs/Pleura: Subsegmental atelectasis in both lower lobes. No focal consolidation, pleural effusion, or pneumothorax. Unchanged 10 mm ground-glass nodule in the left lower lobe (series 9, image 111). Musculoskeletal: Bilateral gynecomastia. Sclerotic lesions noted in the sternum, ribs, left scapula, and thoracic spine are not significantly changed. Old bilateral rib fractures again noted. CT ABDOMEN PELVIS FINDINGS Hepatobiliary: No focal liver abnormality is seen. No gallstones, gallbladder wall thickening, or biliary dilatation. Pancreas: Unremarkable. No pancreatic ductal dilatation or surrounding inflammatory changes. Spleen: Normal in size without focal abnormality. Adrenals/Urinary Tract: Adrenal glands are unremarkable. Kidneys are normal, without renal calculi, focal lesion, or hydronephrosis. Bladder is decompressed. Stomach/Bowel: Stomach is within normal limits. Appendix is normal. No evidence of bowel wall thickening, distention, or inflammatory changes. Mild left-sided colonic diverticulosis again noted. Vascular/Lymphatic: Bulky lymphadenopathy in the upper abdomen and retroperitoneum has increased in size. For example, a hepatoduodenal ligament lymph node now measures 4.3 cm in short axis, previously 2.5 cm. Right inguinal lymph nodes have increased in size, now measuring up to 2.5 cm, previously 1.6 cm. The left external iliac lymph node now measures 1.6 cm in short axis, previously 0.7 cm. Aortic atherosclerosis. Reproductive: Brachytherapy  seeds again noted in the prostate gland. Other: Increased mild presacral stranding. Unchanged small bilateral fat containing inguinal hernias. No free fluid or pneumoperitoneum. Musculoskeletal: Sclerotic lesions in the lumbar spine and both iliac bones are grossly unchanged. IMPRESSION: 1.  No acute intrathoracic or intra-abdominal process. 2. Progressive left supraclavicular, upper abdominal, retroperitoneal, and right inguinal nodal  metastatic disease. 3. Grossly unchanged osseous metastases. 4. Unchanged 10 mm ground-glass nodule in the left lower lobe. Initial follow-up with CT in 9 months is recommended to confirm persistence. If persistent, repeat CT is recommended every 2 years until 5 years of stability has been established. This recommendation follows the consensus statement: Guidelines for Management of Incidental Pulmonary Nodules Detected on CT Images: From the Fleischner Society 2017; Radiology 2017; 284:228-243. 5.  Aortic atherosclerosis (ICD10-I70.0). Electronically Signed   By: Titus Dubin M.D.   On: 03/30/2019 16:25   Ct Abdomen Pelvis W Contrast  Result Date: 03/30/2019 CLINICAL DATA:  Left upper quadrant pain since last night. History of metastatic prostate cancer. EXAM: CT CHEST, ABDOMEN, AND PELVIS WITH CONTRAST TECHNIQUE: Multidetector CT imaging of the chest, abdomen and pelvis was performed following the standard protocol during bolus administration of intravenous contrast. CONTRAST:  123mL OMNIPAQUE IOHEXOL 300 MG/ML  SOLN COMPARISON:  CT chest, abdomen, and pelvis dated Dec 22, 2018. FINDINGS: CT CHEST FINDINGS Cardiovascular: Normal heart size. No pericardial effusion. No thoracic aortic aneurysm or dissection. Coronary, aortic arch, and branch vessel atherosclerotic vascular disease. No central pulmonary embolism. Mediastinum/Nodes: Enlarging left supraclavicular lymph nodes measuring up to 1.8 cm, previously 10 mm. New mildly enlarged right paratracheal lymph node measuring 1.2 cm in short axis. Unchanged enlarged retrocrural lymph nodes measuring up to 15 mm. No enlarged axillary or hilar lymph nodes. The thyroid gland, trachea, and esophagus are unremarkable. Lungs/Pleura: Subsegmental atelectasis in both lower lobes. No focal consolidation, pleural effusion, or pneumothorax. Unchanged 10 mm ground-glass nodule in the left lower lobe (series 9, image 111). Musculoskeletal: Bilateral gynecomastia. Sclerotic  lesions noted in the sternum, ribs, left scapula, and thoracic spine are not significantly changed. Old bilateral rib fractures again noted. CT ABDOMEN PELVIS FINDINGS Hepatobiliary: No focal liver abnormality is seen. No gallstones, gallbladder wall thickening, or biliary dilatation. Pancreas: Unremarkable. No pancreatic ductal dilatation or surrounding inflammatory changes. Spleen: Normal in size without focal abnormality. Adrenals/Urinary Tract: Adrenal glands are unremarkable. Kidneys are normal, without renal calculi, focal lesion, or hydronephrosis. Bladder is decompressed. Stomach/Bowel: Stomach is within normal limits. Appendix is normal. No evidence of bowel wall thickening, distention, or inflammatory changes. Mild left-sided colonic diverticulosis again noted. Vascular/Lymphatic: Bulky lymphadenopathy in the upper abdomen and retroperitoneum has increased in size. For example, a hepatoduodenal ligament lymph node now measures 4.3 cm in short axis, previously 2.5 cm. Right inguinal lymph nodes have increased in size, now measuring up to 2.5 cm, previously 1.6 cm. The left external iliac lymph node now measures 1.6 cm in short axis, previously 0.7 cm. Aortic atherosclerosis. Reproductive: Brachytherapy seeds again noted in the prostate gland. Other: Increased mild presacral stranding. Unchanged small bilateral fat containing inguinal hernias. No free fluid or pneumoperitoneum. Musculoskeletal: Sclerotic lesions in the lumbar spine and both iliac bones are grossly unchanged. IMPRESSION: 1.  No acute intrathoracic or intra-abdominal process. 2. Progressive left supraclavicular, upper abdominal, retroperitoneal, and right inguinal nodal metastatic disease. 3. Grossly unchanged osseous metastases. 4. Unchanged 10 mm ground-glass nodule in the left lower lobe. Initial follow-up with CT in 9 months is recommended to confirm persistence. If persistent, repeat  CT is recommended every 2 years until 5 years of  stability has been established. This recommendation follows the consensus statement: Guidelines for Management of Incidental Pulmonary Nodules Detected on CT Images: From the Fleischner Society 2017; Radiology 2017; 284:228-243. 5.  Aortic atherosclerosis (ICD10-I70.0). Electronically Signed   By: Titus Dubin M.D.   On: 03/30/2019 16:25    Medications: I have reviewed the patient's current medications.     Assessment/Plan:  63 year old with:  1.  End-stage prostate cancer that has progressed on multiple therapies.  Despite his reasonable performance status he has been on supportive care only after he has declined any further therapy opting to focus on quality of life rather than additional treatment and compromising of his quality of life with very little chance of benefit.  This was reiterated again with him today and he is clear about his goals is to palliate any cancer related symptoms other than to treat the underlying cancer.  2.  Occipital metastasis: He has received radiation therapy at [facility] during his treatment phase.  And based on CT scan he does have increased vasogenic edema and certainly benefiting from restarting steroids.  He is currently on Decadron 4 mg every 6 hours which is reasonable to continue.  We will discuss with Dr. Tammi Klippel and radiation oncology whether any further treatment is needed in that area.  3.  Abdominal pain: Related to tumor progression in multiple locations dominantly to hepatoduodenal conglomerate lymphadenopathy.  His pain is manageable with the current pain regimen including MS Contin with intermittent intravenous Dilaudid.  Will consider additional radiation therapy to a problematic abdominal lymphadenopathy if he can be done.  4.  Prognosis and goals of care: His prognosis is overall poor with limited life expectancy.  He already has a DNR and clear advanced directives.  He might need additional resources upon discharge whether  for placement or home health or potentially home hospice.   25  minutes was spent with the patient face-to-face today.  More than 50% of time was spent on reviewing his disease status, reviewing imaging studies as well as management options.    Alejandro [name] 03/31/2019, 7:44 AM

## 2019-03-31 NOTE — H&P (Signed)
HPI  [employer]. [id number] DOB: November 05, 1955 DOA: 03/30/2019  PCP: [name], MD   Chief Complaint: Multiple  HPI:  63 year old Caucasian male Metastatic prostate cancer diagnosed 02/2014 [facility] + XRT 2000 16/2017 poor tolerance of chemotherapy and clinical trial concluded in [place] 10/01/2018 secondary to progression of disease-treated earlier this summer back in [place] for metastatic bony mets to the head-treated with XRT Apparently supposed to get head neck treatment in the next week or so under the care of Dr. Tammi Klippel with XRT  currently Lupron Calla Kicks  Other medical issues include Atrial fibrillation last EF 66% mild pulmonary hypertension Pancytopenia, hypertension, hyperlipidemia  Comes to emergency room 8/26 intractable abdominal pain, 10/10-supposed to be on morphine every 3 hours 15 mg which he is taking 2 to 3 tablets had a severe paroxysm early a.m. decided to come here to get it worked up-CT scans as below Labs relatively benign  In addition patient also for the past 2/52 increasing weakness, dizziness, blurred vision-2-3 episodes falls last one on 8/22-uses walker or cane-  ED called and spoke with Dr. Duard Larsen of his symptoms with dizziness and enlargement of area on hand admission recommended for pain control, Rx cerebral metastases   ED Course: Received Decadron, Dilaudid 1 mg Zofran saline bolus 1000 cc  Review of Systems:  Negative for fever, chills, melena, vomit, unilateral weakness, seizure, fall, + Weakness, intractable abdominal pain,  Past Medical History:  Diagnosis Date  . ANXIETY   . AORTIC STENOSIS, MILD   . CHEST PAIN-UNSPECIFIED   . DYSPNEA   . HYPERCHOLESTEROLEMIA   . Hypertension   . INSOMNIA   . OSA on CPAP 05/31/2013  . OSTEOARTHRITIS, KNEE   . Prostate cancer [facility])     Past Surgical History:  Procedure Laterality Date  . COLONOSCOPY    . ELBOW SURGERY    . LUMBAR LAMINECTOMY  Late  1990's  . NEUROPLASTY / TRANSPOSITION MEDIAN NERVE AT CARPAL TUNNEL BILATERAL    . PROSTATE BIOPSY       reports that he quit smoking about 20 years ago. His smoking use included cigars and cigarettes. He has a 75.00 pack-year smoking history. He has never used smokeless tobacco. He reports current alcohol use of about 12.0 standard drinks of alcohol per week. He reports previous drug use. Drug: Cocaine. Mobility: Independent but uses walker occasionally Previously used to be an executive for company quit in 2018  Allergies  Allergen Reactions  . Codeine Nausea Only    Family History  Problem Relation Age of Onset  . Prostate cancer Father   . Breast cancer Neg Hx   . Colon cancer Neg Hx      Prior to Admission medications   Medication Sig Start Date End Date Taking? Authorizing Provider  calcium-vitamin D (OSCAL WITH D) 500-200 MG-UNIT tablet Take 1 tablet by mouth 2 (two) times daily. 03/16/18  Yes Wyatt Portela, MD  ezetimibe (ZETIA) 10 MG tablet Take 10 mg by mouth daily.   Yes [provider]  losartan (COZAAR) 50 MG tablet Take 50 mg by mouth daily.  12/04/17  Yes [provider]  Melatonin 12 MG TABS Take 12 mg by mouth at bedtime.   Yes [provider]  morphine (MS CONTIN) 15 MG 12 hr tablet Take 1 tablet (15 mg total) by mouth 2 (two) times daily as needed for pain. Patient taking differently: Take 15-30 mg by mouth at bedtime.  02/25/19  Yes Zola Button  N, MD  morphine (MSIR) 15 MG tablet Take 1 tablet (15 mg total) by mouth every 4 (four) hours as needed for severe pain. 03/14/19  Yes Wyatt Portela, MD  prochlorperazine (COMPAZINE) 10 MG tablet Take 1 tablet (10 mg total) by mouth every 6 (six) hours as needed for nausea or vomiting. 03/18/19  Yes Wyatt Portela, MD  tamsulosin (FLOMAX) 0.4 MG CAPS capsule TAKE ONE CAPSULE BY MOUTH TWICE DAILY Patient taking differently: Take 0.4 mg by mouth 2 (two) times daily.  02/28/19  Yes Wyatt Portela,  MD    Physical Exam:  Vitals:   03/31/19 1148 03/31/19 1153  BP: (!) 147/94 109/73  Pulse: (!) 59 66  Resp:    Temp:    SpO2:       Awake alert coherent no distress Mallampati 4  No JVD  Chest clear  Abdomen slightly tender in left lower quadrant postsurgical change  Power 5/5 bilaterally major muscle groups power symmetric uvula midline shoulder shrug bilaterally equal sensory grossly intact further exam deferred  Rectal deferred  No lower extremity edema  Skin soft supple no rash  I have personally reviewed following labs and imaging studies  Labs:       Imaging studies:   BUN/creatinine 11/0.7 lipase 20 LFTs normal  Hemoglobin 10.2 baseline 11 no white count platelets normal  Medical tests:   EKG independently reviewed:   None performed  Test discussed with performing physician:  He has discussed in detail with Dr. Gilford Raid  Decision to obtain old records:   Yes extensively reviewed  Review and summation of old records:   Yes  Active Problems:   Essential hypertension   Aortic valve disorder   Osteoarthrosis involving lower leg   Clinical stage T1c N1 M0 adenocarcinoma of the prostate with a Gleason's score of 4+4 and a PSA of 6.4   Neutropenic fever (HCC)   Prostate cancer metastatic to bone (HCC)   Orthostatic hypotension   Secondary and unspecified malignant neoplasm of lymph nodes of head, face and neck (HCC)   Cancer related pain   Cancer associated pain   Assessment/Plan Metastatic prostate cancer with intractable cancer related pain Change morphine MSIR every 4 to Dilaudid 2 mg IV every 4 as needed-okay to continue MS Contin extended release tonight  Aim to transition to oral Dilaudid probably in the a.m. dependent on how patient does on IV Dilaudid Because of brain mets we will start Decadron 4 mg every 6 and await further input from oncologist-we will place also on prophylactic Keppra 500 twice daily at this time which can be  discontinued at their discretion Oncologist and radiation oncologist will be CCed on this note as it appears he was supposed to start in the very near future head and neck XRT which may palliate the enlarged area on CT of the head that may be causing some of his issues Dizziness Obtain orthostatics some of her symptoms could be from the brain met but I noticed that he is on various medications that can cause this as well-I have discontinued losartan 50 completely Atrial fibrillation chads score 2 Keep on monitors not on any anticoagulation prior to admission Hyperlipidemia-no mortality benefit given advanced metastatic cancer-discontinued    Severity of Illness: The appropriate patient status for this patient is INPATIENT. Inpatient status is judged to be reasonable and necessary in order to provide the required intensity of service to ensure the patient's safety. The patient's presenting symptoms, physical exam findings, and  initial radiographic and laboratory data in the context of their chronic comorbidities is felt to place them at high risk for further clinical deterioration. Furthermore, it is not anticipated that the patient will be medically stable for discharge from the hospital within 2 midnights of admission. The following factors support the patient status of inpatient.   " The patient's presenting symptoms include intractable pain. " The worrisome physical exam findings include abdominal pain dizziness. " The initial radiographic and laboratory data are worrisome because of possible risk of seizure. " The chronic co-morbidities include metastatic cancer.   * I certify that at the point of admission it is my clinical judgment that the patient will require inpatient hospital care spanning beyond 2 midnights from the point of admission due to high intensity of service, high risk for further deterioration and high frequency of surveillance required.*     DVT prophylaxis: Lovenox  Code Status: DNR was confirmed in detail with the patient at bedside Family Communication: None Consults called: Radiation oncology was consulted by me oncologist was consulted by ED  Time spent: 13 minutes  Hridhaan Yohn, MD  [facility] --[place]  --[email or web address]; password [email or web address]  7PM-7AM contact night coverage as above  03/31/2019, 3:38 PM

## 2019-03-31 NOTE — Evaluation (Signed)
Physical Therapy Evaluation Patient Details Name: Alejandro [name]. MRN: [id number] DOB: 26-May-1956 Today's Date: 03/31/2019   History of Present Illness  63 yo male admitted to ED on 8/26 with LUQ pain, suspect due to metastatic prostate cancer with mets to bone, brain, skull. Pt underwent chemo, radiation, and clinical trials in [place], pt with poor prognosis. PMH includes anxiety, OA, lumbar laminectomy, HTN.  Clinical Impression   Pt presents with increased time and effort to perform mobility tasks, unsteadiness in standing, difficulty with high level balance tasks, and decreased activity tolerance. Pt to benefit from acute PT to address deficits. Pt ambulated hallway distance with RW with min guard assist, pt with occasional unsteadiness especially with horizontal head turning. Pt with no dizziness with changes in position this session. Pt's balance and coordination deficits consistent with potential central nervous system involvement, orthostatics also implicated given pt's subjective history of "blacking out when standing up". PT recommending HHPT to address mobility deficits and high level balance deficits. PT initially recommended OPPT with neuro rehab, but pt does not drive and does not have consistent assistance to drive him to and from appointments. PT also recommending rollator given pt's history of feeling dizzy with position changes or when moving around his apt. PT to progress mobility as tolerated, and will continue to follow acutely.      Follow Up Recommendations Home health PT    Equipment Recommendations  Other (comment)(recommending rollator for safety with mobility for dizzy spells)    Recommendations for Other Services       Precautions / Restrictions Precautions Precautions: Fall Restrictions Weight Bearing Restrictions: No      Mobility  Bed Mobility Overal bed mobility: Needs Assistance Bed Mobility: Supine to Sit     Supine to sit: Supervision      General bed mobility comments: supervision for safety, increased time.  Transfers Overall transfer level: Needs assistance Equipment used: Rolling walker (2 wheeled) Transfers: Sit to/from Stand Sit to Stand: Supervision         General transfer comment: supervision for safety, increased time to rise.  Ambulation/Gait Ambulation/Gait assistance: Min guard Gait Distance (Feet): 300 Feet Assistive device: Rolling walker (2 wheeled) Gait Pattern/deviations: Step-through pattern;Decreased stride length;Trunk flexed Gait velocity: decr   General Gait Details: Min guard for safety. Verbal cuing for placing RW on ground and placement in RW during ambulation. Pt reporting dizziness and gait deviations laterally when turning head L and R during ambulation.  Stairs            Wheelchair Mobility    Modified Rankin (Stroke Patients Only)       Balance Overall balance assessment: Needs assistance;History of Falls Sitting-balance support: No upper extremity supported;Feet supported Sitting balance-Leahy Scale: Good     Standing balance support: Bilateral upper extremity supported Standing balance-Leahy Scale: Fair Standing balance comment: able to stand without UE support, uses RW for dynamic stability.             High level balance activites: Head turns;Other (comment) High Level Balance Comments: tandem ambulation WFL with UE support, and horizontal head turns producing L and R weaving of gait             Pertinent Vitals/Pain Pain Assessment: Faces Faces Pain Scale: Hurts a little bit Pain Location: LUQ Pain Descriptors / Indicators: Discomfort Pain Intervention(s): Limited activity within patient's tolerance;Monitored during session;Repositioned;Premedicated before session    Home Living Family/patient expects to be discharged to:: Private residence Living Arrangements: Alone Available Help at  Discharge: Friend(s);Available PRN/intermittently Type of  Home: Apartment Home Access: Level entry     Home Layout: One level Home Equipment: Walker - 2 wheels      Prior Function Level of Independence: Independent with assistive device(s)         Comments: pt reports using RW as needed PTA. Pt states he has had 6-7 falls since diagnosis, states most recent fall 1 week ago and pt describes fall as standing up too quickly and passing out. Pt also reports dizziness with head movements, like when taking a shower and rinsing his hair.     Hand Dominance   Dominant Hand: Right    Extremity/Trunk Assessment   Upper Extremity Assessment Upper Extremity Assessment: Overall WFL for tasks assessed    Lower Extremity Assessment Lower Extremity Assessment: Overall WFL for tasks assessed    Cervical / Trunk Assessment Cervical / Trunk Assessment: Normal  Communication   Communication: No difficulties  Cognition Arousal/Alertness: Awake/alert Behavior During Therapy: WFL for tasks assessed/performed Overall Cognitive Status: Within Functional Limits for tasks assessed                                        General Comments      Exercises     Assessment/Plan    PT Assessment Patient needs continued PT services  PT Problem List Decreased strength;Decreased mobility;Decreased activity tolerance;Decreased balance;Pain       PT Treatment Interventions DME instruction;Therapeutic activities;Gait training;Patient/family education;Therapeutic exercise;Balance training;Functional mobility training    PT Goals (Current goals can be found in the Care Plan section)  Acute Rehab PT Goals Patient Stated Goal: get more steady , be comfortable PT Goal Formulation: With patient Time For Goal Achievement: 04/14/19 Potential to Achieve Goals: Good    Frequency Min 3X/week   Barriers to discharge Decreased caregiver support      Co-evaluation               AM-PAC PT "6 Clicks" Mobility  Outcome Measure Help  needed turning from your back to your side while in a flat bed without using bedrails?: A Little Help needed moving from lying on your back to sitting on the side of a flat bed without using bedrails?: A Little Help needed moving to and from a bed to a chair (including a wheelchair)?: A Little Help needed standing up from a chair using your arms (e.g., wheelchair or bedside chair)?: A Little Help needed to walk in hospital room?: A Little Help needed climbing 3-5 steps with a railing? : A Little 6 Click Score: 18    End of Session Equipment Utilized During Treatment: Gait belt Activity Tolerance: Patient tolerated treatment well Patient left: in bed;with call bell/phone within reach Nurse Communication: Mobility status PT Visit Diagnosis: Other abnormalities of gait and mobility (R26.89);Difficulty in walking, not elsewhere classified (R26.2)    Time: 1350-1415 PT Time Calculation (min) (ACUTE ONLY): 25 min   Charges:   PT Evaluation $PT Eval Low Complexity: 1 Low PT Treatments $Gait Training: 8-22 mins        Julien Girt, PT Acute Rehabilitation Services Pager [phone]  Office [phone]  Johnhenry Tippin D Elonda Husky 03/31/2019, 3:31 PM

## 2019-03-31 NOTE — Progress Notes (Signed)
PROGRESS NOTE  Alejandro [name]. [id number] DOB: 04/03/1956 DOA: 03/30/2019 PCP: [name], MD  Brief History   63 year old Caucasian male, known atrial fib chads score 2 not on anticoagulation pancytopenia HTN HLD Metastatic prostate cancer diagnosed 02/2014 [facility] + XRT 2000 16/2017 poor tolerance of chemotherapy and clinical trial concluded in [place] 10/01/2018 secondary to progression of disease-treated earlier this summer back in [place] for metastatic bony mets to the head-treated with XRT currently Lupron Calla Kicks  admit 8/26 intractable abdominal pain, ongoing dizziness blurred vision 2-3 falls last one 8/22 onto the back of his head-CT scans significant for 3 cm mass back of head-abdominal CT is negative for any new pathology  A & P  Metastatic prostate cancer with intractable cancer related pain Changed morphine to Dilaudid on admission IV-transition to Dilaudid 6 mg po liquid every 3 as needed, MS Contin 15 twice daily-daily adjustments by a.m. physician only Continuing Decadron 4 mg every 6, Keppra 500 twice daily XRT and further management as per radiation and medical oncologist Dizziness-multifactorial primary driver may be orthostatic Orthostatics are positive discontinue losartan completely Placed on hydralazine if blood pressures above 160 Atrial fibrillation chads score 2 Keep on monitors not on any anticoagulation prior to admission Hyperlipidemia-no mortality benefit given advanced metastatic cancer-discontinued  DVT prophylaxis: lovenox Code Status: DNAR Family Communication: none Disposition Plan: home am    Verneita Griffes, MD [facility] 1:29 PM  03/31/2019, 1:29 PM  LOS: 1 day   Consultants  . Dr Alen Blew . Dr Tammi Klippel  Procedures  . none  Antibiotics  . non  Interval History/Subjective  Awake alert  No distress no recurrenc eof pain Tolerating diet Just back from XRT  Objective   Vitals:  Vitals:   03/31/19  1148 03/31/19 1153  BP: (!) 147/94 109/73  Pulse: (!) 59 66  Resp:    Temp:    SpO2:      Exam:  Awake coherent pleasant Chest clear no added sound Abdomen soft no rebound No lower extremity edema Bump at the back of head Neurologically intact moving all 4 limbs equally reflexes and others deferred   I have personally reviewed the following:   Today's Data  .   Lab Data  . AST 13 . Hemoglobin down from 10.2-9.5 platelets good 284 . COVID testing negative   Scheduled Meds: . dexamethasone  4 mg Oral Q6H  . enoxaparin (LOVENOX) injection  40 mg Subcutaneous Q24H  . levETIRAcetam  500 mg Oral BID  . Melatonin  12 mg Oral QHS  . morphine  15 mg Oral Q12H  . tamsulosin  0.4 mg Oral BID   Continuous Infusions:  Active Problems:   Essential hypertension   Aortic valve disorder   Osteoarthrosis involving lower leg   Clinical stage T1c N1 M0 adenocarcinoma of the prostate with a Gleason's score of 4+4 and a PSA of 6.4   Neutropenic fever (HCC)   Prostate cancer metastatic to bone (HCC)   Orthostatic hypotension   Secondary and unspecified malignant neoplasm of lymph nodes of head, face and neck (HCC)   Cancer related pain   Cancer associated pain   LOS: 1 day   How to contact the [facility] Attending or Consulting provider 7A - 7P or covering provider during after hours [place], for this patient?  1. Check the care team in [facility] and look for a) attending/consulting TRH provider listed and b) the [facility] team listed 2. Log into [email or web address] and use [place]  universal password to access. If you do not have the password, please contact the hospital operator. 3. Locate the [facility] provider you are looking for under [facility] and page to a number that you can be directly reached. 4. If you still have difficulty reaching the provider, please page the [facility] [address] (Director on Call) for the Hospitalists listed on amion for assistance.

## 2019-04-01 ENCOUNTER — Other Ambulatory Visit: Payer: Self-pay | Admitting: Radiation Oncology

## 2019-04-01 ENCOUNTER — Ambulatory Visit
Admission: RE | Admit: 2019-04-01 | Discharge: 2019-04-01 | Disposition: A | Discharge status: Home / Self Care | Payer: BC Managed Care – PPO | Source: Ambulatory Visit | Attending: Radiation Oncology | Admitting: Radiation Oncology

## 2019-04-01 DIAGNOSIS — C7951 Secondary malignant neoplasm of bone: Secondary | ICD-10-CM

## 2019-04-01 DIAGNOSIS — K219 Gastro-esophageal reflux disease without esophagitis: Secondary | ICD-10-CM

## 2019-04-01 DIAGNOSIS — C61 Malignant neoplasm of prostate: Secondary | ICD-10-CM

## 2019-04-01 DIAGNOSIS — C7952 Secondary malignant neoplasm of bone marrow: Secondary | ICD-10-CM

## 2019-04-01 DIAGNOSIS — R066 Hiccough: Secondary | ICD-10-CM

## 2019-04-01 MED ORDER — MORPHINE SULFATE ER 15 MG PO TBCR
15.0000 mg | EXTENDED_RELEASE_TABLET | Freq: Every day | ORAL | Status: DC
  Administered 2019-04-01 – 2019-04-03 (×3): 15 mg via ORAL
  Filled 2019-04-01 (×3): qty 1

## 2019-04-01 MED ORDER — MORPHINE SULFATE ER 15 MG PO TBCR: 15.0000 mg | EXTENDED_RELEASE_TABLET | Freq: Two times a day (BID) | ORAL | Status: DC

## 2019-04-01 MED ORDER — PANTOPRAZOLE SODIUM 40 MG PO TBEC: 40.0000 mg | DELAYED_RELEASE_TABLET | Freq: Every day | ORAL | Status: DC

## 2019-04-01 MED ORDER — SONAFINE EX EMUL
1.0000 "application " | Freq: Two times a day (BID) | CUTANEOUS | Status: DC
  Administered 2019-04-01: 1 via TOPICAL

## 2019-04-01 MED ORDER — HYDRALAZINE HCL 10 MG PO TABS
10.0000 mg | ORAL_TABLET | Freq: Two times a day (BID) | ORAL | Status: DC
  Administered 2019-04-01 – 2019-04-03 (×5): 10 mg via ORAL
  Filled 2019-04-01 (×6): qty 1

## 2019-04-01 MED ORDER — MORPHINE SULFATE ER 30 MG PO TBCR
30.0000 mg | EXTENDED_RELEASE_TABLET | Freq: Every day | ORAL | Status: DC
  Administered 2019-04-01 – 2019-04-02 (×2): 30 mg via ORAL
  Filled 2019-04-01 (×2): qty 1

## 2019-04-01 NOTE — Progress Notes (Signed)
Patient walked in the hallway, about 347ft

## 2019-04-01 NOTE — Progress Notes (Signed)
PROGRESS NOTE  Alejandro [name]. [id number] DOB: 04-20-1956 DOA: 03/30/2019 PCP: [name], MD  Brief History   63 year old Caucasian male, known atrial fib chads score 2 not on anticoagulation pancytopenia HTN HLD Metastatic prostate cancer diagnosed 02/2014 [facility] + XRT 2000 16/2017 poor tolerance of chemotherapy and clinical trial concluded in [place] 10/01/2018 secondary to progression of disease-treated earlier this summer back in [place] for metastatic bony mets to the head-treated with XRT currently Lupron Calla Kicks  admit 8/26 intractable abdominal pain, ongoing dizziness blurred vision 2-3 falls last one 8/22 onto the back of his head-CT scans significant for 3 cm mass back of head-abdominal CT is negative for any new pathology  A & P  Metastatic prostate cancer with intractable cancer related pain Morphine-->Dilaudid 6 mg p.o. every 3 as needed Increase nightly MS Contin to 30 nightly in addition to a.m. dose 15-I reviewed the PDMP Continuing Decadron 4 mg every 6, Keppra 500 twice daily XRT will probably be completed as an outpatient appreciate input from specialists Dizziness-multifactorial primary driver may be orthostatic Orthostatics are positive discontinue losartan completely Start hydralazine twice daily 50 0 Atrial fibrillation chads score 2 3 PVCs overnight without any further renal issues and mild bradycardia-discontinue monitoring Hyperlipidemia-no mortality benefit given advanced metastatic cancer-discontinued  DVT prophylaxis: lovenox Code Status: DNAR Family Communication: none Disposition Plan: Pain control needs to be sorted out in addition have to ensure patient is ambulating so we will put in orders for ambulation every 4 hourly to make sure that patient is stable could discharge in the next several days if remains without further issues from pain or debility perspective   Verneita Griffes, MD [facility] 2:59 PM  04/01/2019,  2:59 PM  LOS: 2 days   Consultants  . Dr Alen Blew . Dr Tammi Klippel  Procedures  . none  Antibiotics  . non  Interval History/Subjective   No further paroxysmal pain still does not feel steady on his feet but has not been out of bed Patient upset about night dosing of medications and thinks he missed a couple of doses-charge nurse is aware No chest pain No fever He states that his pain is moderate and he has had occasional twinges but nothing as severe as what brought him here initially   Objective   Vitals:  Vitals:   04/01/19 0552 04/01/19 1416  BP: (!) 149/100 (!) 169/97  Pulse: 65 66  Resp: 18 18  Temp: 97.7 F (36.5 C) 97.7 F (36.5 C)  SpO2: 96% 96%    Exam:   Awake alert coherent pleasant no distress interactive Chest clear Abdomen soft no rebound no guarding No lower extremity edema Neurologically intact no focal deficit   I have personally reviewed the following:   Today's Data  .   Lab Data  . No labs today   Scheduled Meds: . dexamethasone  4 mg Oral Q6H  . enoxaparin (LOVENOX) injection  40 mg Subcutaneous Q24H  . levETIRAcetam  500 mg Oral BID  . Melatonin  12 mg Oral QHS  . morphine  15 mg Oral Daily   And  . morphine  30 mg Oral QHS  . tamsulosin  0.4 mg Oral BID   Continuous Infusions:  Active Problems:   Essential hypertension   Aortic valve disorder   Osteoarthrosis involving lower leg   Clinical stage T1c N1 M0 adenocarcinoma of the prostate with a Gleason's score of 4+4 and a PSA of 6.4   Neutropenic fever ([place])  Prostate cancer metastatic to bone (HCC)   Orthostatic hypotension   Secondary and unspecified malignant neoplasm of lymph nodes of head, face and neck (HCC)   Cancer related pain   Cancer associated pain   LOS: 2 days   How to contact the [facility] Attending or Consulting provider [place] or covering provider during after hours [place], for this patient?  1. Check the care team in [facility] and look for a)  attending/consulting TRH provider listed and b) the [facility] team listed 2. Log into [email or web address] and use [place]'s universal password to access. If you do not have the password, please contact the hospital operator. 3. Locate the [facility] provider you are looking for under [facility] and page to a number that you can be directly reached. 4. If you still have difficulty reaching the provider, please page the [facility] (Director on Call) for the Hospitalists listed on amion for assistance.

## 2019-04-02 LAB — CBC WITH DIFFERENTIAL/PLATELET
Abs Immature Granulocytes: 0.24 10*3/uL — ABNORMAL HIGH (ref 0.00–0.07)
Basophils Absolute: 0 10*3/uL (ref 0.0–0.1)
Basophils Relative: 0 %
Eosinophils Absolute: 0 10*3/uL (ref 0.0–0.5)
Eosinophils Relative: 0 %
HCT: 31.3 % — ABNORMAL LOW (ref 39.0–52.0)
Hemoglobin: 9.9 g/dL — ABNORMAL LOW (ref 13.0–17.0)
Immature Granulocytes: 2 %
Lymphocytes Relative: 3 %
Lymphs Abs: 0.4 10*3/uL — ABNORMAL LOW (ref 0.7–4.0)
MCH: 29.6 pg (ref 26.0–34.0)
MCHC: 31.6 g/dL (ref 30.0–36.0)
MCV: 93.4 fL (ref 80.0–100.0)
Monocytes Absolute: 0.6 10*3/uL (ref 0.1–1.0)
Monocytes Relative: 5 %
Neutro Abs: 9.8 10*3/uL — ABNORMAL HIGH (ref 1.7–7.7)
Neutrophils Relative %: 90 %
Platelets: 355 10*3/uL (ref 150–400)
RBC: 3.35 MIL/uL — ABNORMAL LOW (ref 4.22–5.81)
RDW: 14.9 % (ref 11.5–15.5)
WBC: 11 10*3/uL — ABNORMAL HIGH (ref 4.0–10.5)
nRBC: 0.2 % (ref 0.0–0.2)

## 2019-04-02 LAB — BASIC METABOLIC PANEL
Anion gap: 11 (ref 5–15)
BUN: 20 mg/dL (ref 8–23)
CO2: 27 mmol/L (ref 22–32)
Calcium: 9.4 mg/dL (ref 8.9–10.3)
Chloride: 98 mmol/L (ref 98–111)
Creatinine, Ser: 0.67 mg/dL (ref 0.61–1.24)
GFR calc Af Amer: >60 mL/min (ref >60)
GFR calc non Af Amer: >60 mL/min (ref >60)
Glucose, Bld: 157 mg/dL — ABNORMAL HIGH (ref 70–99)
Potassium: 3.9 mmol/L (ref 3.5–5.1)
Sodium: 136 mmol/L (ref 135–145)

## 2019-04-02 MED ORDER — DEXAMETHASONE 4 MG PO TABS
4.0000 mg | ORAL_TABLET | Freq: Two times a day (BID) | ORAL | Status: DC
  Administered 2019-04-02 – 2019-04-03 (×2): 4 mg via ORAL
  Filled 2019-04-02 (×2): qty 1

## 2019-04-02 MED ORDER — ALUM & MAG HYDROXIDE-SIMETH 200-200-20 MG/5ML PO SUSP
15.0000 mL | Freq: Once | ORAL | Status: AC
  Administered 2019-04-02: 15 mL via ORAL
  Filled 2019-04-02: qty 30

## 2019-04-02 MED ORDER — CHLORPROMAZINE HCL 10 MG PO TABS
10.0000 mg | ORAL_TABLET | Freq: Three times a day (TID) | ORAL | Status: DC
  Administered 2019-04-02 – 2019-04-03 (×3): 10 mg via ORAL
  Filled 2019-04-02 (×5): qty 1

## 2019-04-02 MED ORDER — TAMSULOSIN HCL 0.4 MG PO CAPS: 0.4000 mg | ORAL_CAPSULE | Freq: Two times a day (BID) | ORAL | Status: DC

## 2019-04-02 NOTE — Progress Notes (Signed)
PROGRESS NOTE  Alejandro [name]. [id number] DOB: 1955-09-18 DOA: 03/30/2019 PCP: Alejandro Baton, MD  Brief History   63 year old Caucasian male, known atrial fib chads score 2 not on anticoagulation pancytopenia HTN HLD Metastatic prostate cancer diagnosed 02/2014 [facility] + XRT 2000 16/2017 poor tolerance of chemotherapy and clinical trial concluded in [place] 10/01/2018 secondary to progression of disease-treated earlier this summer back in [place] for metastatic bony mets to the head-treated with XRT currently Lupron Calla Kicks  admit 8/26 intractable abdominal pain, ongoing dizziness blurred vision 2-3 falls last one 8/22 onto the back of his head-CT scans significant for 3 cm mass back of head-abdominal CT is negative for any new pathology  A & P  Metastatic prostate cancer with intractable cancer related pain Morphine-->Dilaudid 6 mg p.o. every 3 as needed Increase nightly MS Contin to 30 nightly in addition to a.m. dose 15-I reviewed the PDMP-his pain is more adequately controlled but still may require tweaking Cut back Decadron 4 every 6 to 4 every 12, Keppra 500 twice daily XRT completion as outpatient-he will probably be going home with hospice I will ask case manager to look in to set this up between today and tomorrow Dizziness-multifactorial primary driver may be orthostatic Orthostatics are positive discontinue losartan completely Start hydralazine twice daily 50 Have asked therapy services to reevaluate for vestibular component given when he turns his head to the left he experiences sudden onset dizziness Atrial fibrillation chads score 2 3 PVCs overnight without any further renal issues and mild bradycardia-discontinue monitoring Hyperlipidemia no mortality benefit given advanced  metastatic cancer-discontinued Impaired glucose tolerance Likely from steroids-would not cover at this time needs outpatient monitoring-tapering steroids expect this to be below  140  DVT prophylaxis: Changed to SCDs Code Status: DNAR Family Communication: none Disposition Plan: Pain control continues to be managed although he has ambulated well he is pretty dizzy and needs vestibular eval but expect if he remains stable can discharge a.m.   Verneita Griffes, MD [facility] 2:10 PM  04/02/2019, 2:10 PM  LOS: 3 days   Consultants  . Dr Alen Blew . Dr Tammi Klippel  Procedures  . none  Antibiotics  . non  Interval History/Subjective   Somewhat pain full early in the morning in his abdomen seems to be a little better overall certainly no significant severe pain as he had on admission Plus plus dizziness on sudden movements leading me to think he may have vestibular component to his symptoms in addition to above Is eating a little better has had no nausea no blackout spells tells me that he veers off to the left on his own at home so he does need to be evaluated here prior to discharge  Objective   Vitals:  Vitals:   04/01/19 2158 04/02/19 0529  BP: (!) 152/95 (!) 151/94  Pulse: (!) 57 (!) 52  Resp: 20 20  Temp: 98.1 F (36.7 C) 98.5 F (36.9 C)  SpO2: 94% 97%    Exam:  Epley's maneuver deferred, tracking of eyes with visual fields to the extent of his horizontal and vertical gaze does not reproduce his symptoms Power is 5/5 bilaterally Lump on the back of his head is intact without any further worsening Abdomen soft no rebound although slightly tender on deeper palpation left lower quadrant No chest discomfort on palpation no extremity edema  I have personally reviewed the following:   Today's Data  .   Lab Data  . White count 11 glucose 150s  Scheduled Meds: . dexamethasone  4 mg Oral Q6H  . enoxaparin (LOVENOX) injection  40 mg Subcutaneous Q24H  . hydrALAZINE  10 mg Oral Q12H  . levETIRAcetam  500 mg Oral BID  . Melatonin  12 mg Oral QHS  . morphine  15 mg Oral Daily   And  . morphine  30 mg Oral QHS  . tamsulosin  0.4 mg Oral BID    Continuous Infusions:  Active Problems:   Essential hypertension   Aortic valve disorder   Osteoarthrosis involving lower leg   Clinical stage T1c N1 M0 adenocarcinoma of the prostate with a Gleason's score of 4+4 and a PSA of 6.4   Neutropenic fever (HCC)   Prostate cancer metastatic to bone (HCC)   Orthostatic hypotension   Secondary and unspecified malignant neoplasm of lymph nodes of head, face and neck (HCC)   Cancer related pain   Cancer associated pain   LOS: 3 days   How to contact the [facility] Attending or Consulting provider 7A - 7P or covering provider during after hours [place], for this patient?  1. Check the care team in [facility] and look for a) attending/consulting [facility] provider listed and b) the [facility] team listed 2. Log into [email or web address] and use [place]'s universal password to access. If you do not have the password, please contact the hospital operator. 3. Locate the [facility] provider you are looking for under [facility] and page to a number that you can be directly reached. 4. If you still have difficulty reaching the provider, please page the [facility] (Director on Call) for the Hospitalists listed on amion for assistance.

## 2019-04-02 NOTE — Progress Notes (Signed)
Physical Therapy Treatment Patient Details Name: Alejandro [name]. MRN: [id number] DOB: [birth year] Today's Date: 04/02/2019    History of Present Illness 63 yo male admitted to ED on 8/26 with LUQ pain, suspect due to metastatic prostate cancer with mets to bone, brain, skull. Pt underwent chemo, radiation, and clinical trials in [place], pt with poor prognosis. PMH includes anxiety, OA, lumbar laminectomy, HTN.    PT Comments    Vestibular eval ordered on today. Performed smooth pursuit, saccade, and VOR testing- no positive indications observed. Performed supine head roll test to L and R-no nystagmus observed or dizziness/spinning reported. Pt did not have any dizziness/nystagmus with positional changes. He does report unsteadiness when ambulating. I did observe lateral deviations in gait path when challenged with head turns while ambulating. He also reported hx of feeling abnormal sensations in front of legs just prior to "collapsing" at home. He stated if he stopped and held on to something or sat down that feeling would subside and he could continue on. This did not occur during today's session. Discussed PT f/u-outpatient PT would be ideal however transportation is an issue. He did not feel he needed HHPT. He stated he has a "PT neighbor" who can assist him if necessary. He is agreeable to using a 4 wheeled walker for safety (for added stability and ability to sit down when needed). Pt has been walking the hallways unassisted with RW for safety.    Follow Up Recommendations  Home health PT(pt declined HHPT f/u)     Equipment Recommendations  (4 wheeled rolling walker)    Recommendations for Other Services       Precautions / Restrictions Precautions Precautions: Fall Restrictions Weight Bearing Restrictions: No    Mobility  Bed Mobility Overal bed mobility: Modified Independent                Transfers Overall transfer level: Needs assistance   Transfers: Sit  to/from Stand Sit to Stand: Modified independent (Device/Increase time)            Ambulation/Gait Ambulation/Gait assistance: Supervision;Min guard Gait Distance (Feet): 300 Feet Assistive device: None Gait Pattern/deviations: Step-through pattern     General Gait Details: Intermittent unsteadiness but no overt LOB. Pt tolerated distance well. No c/o dizziness reported. Close supervision/Min guard provided.   Stairs             Wheelchair Mobility    Modified Rankin (Stroke Patients Only)       Balance Overall balance assessment: History of Falls                           High level balance activites: Head turns;Direction changes High Level Balance Comments: Noted lateral deviations/weaving with head turns. Close guarding provided.            Cognition Arousal/Alertness: Awake/alert Behavior During Therapy: WFL for tasks assessed/performed Overall Cognitive Status: Within Functional Limits for tasks assessed                                        Exercises      General Comments        Pertinent Vitals/Pain Pain Assessment: Faces Faces Pain Scale: No hurt    Home Living                      Prior Function  PT Goals (current goals can now be found in the care plan section) Progress towards PT goals: Progressing toward goals    Frequency    Min 3X/week      PT Plan Current plan remains appropriate    Co-evaluation              AM-PAC PT "6 Clicks" Mobility   Outcome Measure  Help needed turning from your back to your side while in a flat bed without using bedrails?: None Help needed moving from lying on your back to sitting on the side of a flat bed without using bedrails?: None Help needed moving to and from a bed to a chair (including a wheelchair)?: None Help needed standing up from a chair using your arms (e.g., wheelchair or bedside chair)?: None Help needed to walk in  hospital room?: A Little Help needed climbing 3-5 steps with a railing? : A Little 6 Click Score: 22    End of Session Equipment Utilized During Treatment: Gait belt Activity Tolerance: Patient tolerated treatment well Patient left: in bed;with call bell/phone within reach   PT Visit Diagnosis: History of falling (Z91.81);Unsteadiness on feet (R26.81);Other symptoms and signs involving the nervous system (R29.898)     Time: [id number] PT Time Calculation (min) (ACUTE ONLY): 29 min  Charges:  $Gait Training: 23-37 mins                        Weston Anna, PT Acute Rehabilitation Services Pager: [phone] Office: [phone]

## 2019-04-03 MED ORDER — CHLORPROMAZINE HCL 10 MG PO TABS: 10.0000 mg | ORAL_TABLET | Freq: Two times a day (BID) | ORAL | 0 refills | Status: DC | PRN

## 2019-04-03 MED ORDER — HYDROMORPHONE HCL 1 MG/ML PO LIQD: 6.0000 mg | ORAL | 0 refills | Status: DC | PRN

## 2019-04-03 MED ORDER — NEBIVOLOL HCL 5 MG PO TABS: 5.0000 mg | ORAL_TABLET | Freq: Every day | ORAL | 0 refills | Status: DC

## 2019-04-03 MED ORDER — DEXAMETHASONE 4 MG PO TABS: 4.0000 mg | ORAL_TABLET | Freq: Two times a day (BID) | ORAL | 0 refills | Status: DC

## 2019-04-03 NOTE — Progress Notes (Signed)
Patient discharged to home with family, discharge instructions  Reviewed with patient who verbalized understanding.

## 2019-04-03 NOTE — Discharge Summary (Signed)
Physician Discharge Summary  [facility]. AG:[id number] DOB: 11/06/1955 DOA: 03/30/2019  PCP: [name], MD  Admit date: 03/30/2019 Discharge date: 04/03/2019  Time spent: 35 minutes  Recommendations for Outpatient Follow-up:  1. Consider follow-up with Dr. Einar Gip in the outpatient I will CCM I have resumed the patient's Bystolic 2.5 which she has at home 2. Limited prescription of Dilaudid given liquid-addition of Thorazine to be used cautiously for intractable hiccups as he is on Compazine already 3. CBC plus Chem-12 in 1 week 4. Further care management of pain and oncological process as per Dr. Tammi Klippel and Dr. Alen Blew who will be copied on this note-it looks like they will coordinate home with home hospice once he completes all of his XRT as his prognosis is guarded long-term given his metastatic cancer  Discharge Diagnoses:  Active Problems:   Essential hypertension   Aortic valve disorder   Osteoarthrosis involving lower leg   Clinical stage T1c N1 M0 adenocarcinoma of the prostate with a Gleason's score of 4+4 and a PSA of 6.4   Neutropenic fever (HCC)   Prostate cancer metastatic to bone (HCC)   Orthostatic hypotension   Secondary and unspecified malignant neoplasm of lymph nodes of head, face and neck (HCC)   Cancer related pain   Cancer associated pain   Discharge Condition: Guarded  Diet recommendation: Liberalize  Filed Weights   03/30/19 1159 03/30/19 1903  Weight: 97.5 kg 100.1 kg    History of present illness:  63 year old Caucasian male, known atrial fib chads score 2 not on anticoagulation pancytopenia HTN HLD Metastatic prostate cancer diagnosed 02/2014 [facility] + XRT 2016/2017 poor tolerance of chemotherapy and clinical trial concluded in [place] 10/01/2018 secondary to progression of disease-treated earlier this summer back in [place] for metastatic bony mets to the head-treated with XRT currently Lupron Calla Kicks   admit 8/26  intractable abdominal pain, ongoing dizziness blurred vision 2-3 falls last one 8/22 onto the back of his head-CT scans significant for 3 cm mass back of head-abdominal CT is negative for any new pathology  Hospital Course:  Metastatic prostate cancer with intractable cancer related pain Morphine-->Dilaudid 6 mg p.o. every 3 as needed can resume home MS Contin ER May use ibuprofen limited small doses with food as PRN Cut back Decadron 4 every 6 to 4 every 12-Limited prescription given on discharge to be followed up by oncology , Keppra started on admission but discontinued on discharge given no symptoms XRT completion as outpatient-he will probably be going home with hospice-Case manager to consult late this either as outpatient if cannot be done while still on treatment for as per Dr. Alen Blew Dizziness-multifactorial primary driver may be orthostatic Orthostatics were positive was on hydralazine transiently Vestibular eval did not show any component of this He needs to be on rate control occasionally so we started low-dose Bystolic which she is used with success in the past Atrial fibrillation chads score 2 Had PVCs in a very limited fashion over the course of hospital stay I discontinued monitor He has had bad side effects with amiodarone metoprolol in the past with headaches so he had the best effect with Bystolic which asked him to resume and he has a follow-up appointment with his cardiologist Dr. Einar Gip who will see him in the outpatient setting anyway in the next couple of weeks Hyperlipidemia no mortality benefit given advanced  metastatic cancer-discontinued Impaired glucose tolerance Likely from steroids-would not cover at this time needs outpatient monitoring-tapering steroids expect this to  be below 140   Consultations:  Dr. Alen Blew, Dr. Tammi Klippel  Discharge Exam: Vitals:   04/02/19 2016 04/03/19 0542  BP: 114/78 (!) 151/87  Pulse: 67 (!) 54  Resp: 16 18  Temp: 98 F (36.7 C)  98 F (36.7 C)  SpO2: 96% 97%    General: Awake alert coherent no distress-some pain to the lower back no abdominal pain no further real dizziness Cardiovascular: S1-S2 no murmur sinus rhythm on exam monitors discontinued Respiratory: Chest clear No lower extremity edema Abdomen soft obese  Discharge Instructions   Discharge Instructions    Diet - low sodium heart healthy   Complete by: As directed    Discharge instructions   Complete by: As directed    Take meds as indicated--notice changes to the dilaudid and the limited supply which you will need to refill at Dr Alen Blew office Take ibuprofen with food if you take it at all Steroid use to be decided upon with oncology   Increase activity slowly   Complete by: As directed      Allergies as of 04/03/2019      Reactions   Codeine Nausea Only      Medication List    STOP taking these medications   calcium-vitamin D 500-200 MG-UNIT tablet Commonly known as: OSCAL WITH D   ezetimibe 10 MG tablet Commonly known as: ZETIA   losartan 50 MG tablet Commonly known as: COZAAR     TAKE these medications   chlorproMAZINE 10 MG tablet Commonly known as: THORAZINE Take 1 tablet (10 mg total) by mouth 2 (two) times daily as needed.   dexamethasone 4 MG tablet Commonly known as: DECADRON Take 1 tablet (4 mg total) by mouth every 12 (twelve) hours.   HYDROmorphone HCl 1 MG/ML Liqd Commonly known as: DILAUDID Take 6 mLs (6 mg total) by mouth every 3 (three) hours as needed for up to 3 days for severe pain.   Melatonin 12 MG Tabs Take 12 mg by mouth at bedtime.   morphine 15 MG 12 hr tablet Commonly known as: MS CONTIN Take 1 tablet (15 mg total) by mouth 2 (two) times daily as needed for pain. What changed:   how much to take  when to take this  Another medication with the same name was removed. Continue taking this medication, and follow the directions you see here.   nebivolol 5 MG tablet Commonly known as:  Bystolic Take 1 tablet (5 mg total) by mouth daily.   prochlorperazine 10 MG tablet Commonly known as: COMPAZINE Take 1 tablet (10 mg total) by mouth every 6 (six) hours as needed for nausea or vomiting.   tamsulosin 0.4 MG Caps capsule Commonly known as: FLOMAX TAKE 1 CAPSULE BY MOUTH TWICE DAILY      Allergies  Allergen Reactions  . Codeine Nausea Only      The results of significant diagnostics from this hospitalization (including imaging, microbiology, ancillary and laboratory) are listed below for reference.    Significant Diagnostic Studies: Ct Head Wo Contrast  Result Date: 03/30/2019 CLINICAL DATA:  Altered level of consciousness. History of metastatic prostate cancer. Known right occipital mass treated with radiation therapy in 01/2019. EXAM: CT HEAD WITHOUT CONTRAST TECHNIQUE: Contiguous axial images were obtained from the base of the skull through the vertex without intravenous contrast. COMPARISON:  12/29/2017 head CT. 12/23/2018 nuclear medicine whole body bone scan. FINDINGS: Brain: There is an approximately 3.2 x 1.8 x 3.2 cm mass in the posterior right occipital region  which is heterogeneously hyperdense which may reflect mineralization versus petechial type hemorrhage. It is not completely clear whether the mass is extra-axial or intra-axial although the former is favored (potentially with brain invasion) given sclerosis involving the overlying skull. There is moderately extensive vasogenic edema in the right occipital lobe and posterior temporal lobe with regional sulcal effacement and mass effect on the right lateral ventricle. There is no midline shift. No acute cortically based infarct or extra-axial fluid collection is identified. Cerebral atrophy is mildly age advanced. Hypodensities in the cerebral white matter bilaterally are nonspecific but compatible with mild chronic small vessel ischemic disease. Vascular: Calcified atherosclerosis at the skull base. No  hyperdense vessel. Skull: Multiple sclerotic skull lesions consistent with metastases including a large area of right parieto-occipital skull involvement. Sinuses/Orbits: Opacification of a single posterior left ethmoid air cell. Unremarkable orbits. Other: Small right frontal scalp lipoma. IMPRESSION: 1. 3.2 cm right occipital mass with moderately extensive vasogenic edema consistent with metastatic disease. Regional mass effect without midline shift. 2. Multiple skull metastases. Electronically Signed   By: Logan Bores M.D.   On: 03/30/2019 16:29   Ct Soft Tissue Neck W Contrast  Result Date: 03/30/2019 CLINICAL DATA:  Neck mass, afebrile. History of prostate cancer with metastatic disease. EXAM: CT NECK WITH CONTRAST TECHNIQUE: Multidetector CT imaging of the neck was performed using the standard protocol following the bolus administration of intravenous contrast. CONTRAST:  140mL OMNIPAQUE IOHEXOL 300 MG/ML  SOLN COMPARISON:  CT head 12/29/2017 FINDINGS: Pharynx and larynx: Normal. No mass or swelling. Salivary glands: No inflammation, mass, or stone. Thyroid: Negative Lymph nodes: Numerous lymph nodes in the neck, left greater than right. Largest lymph node is left supraclavicular, level 4 node measuring 28 x 34 mm. Additional multiple subcentimeter nodes left lower neck. Necrotic lymph node on the right measures 15 mm posteriorly, level 5 in the mid neck. Additional small level 5 nodes bilaterally Vascular: Normal vascular enhancement.  Mild atherosclerotic disease Limited intracranial: Enhancing mass lesion in the right occipital parietal region. This is a peripheral mass lesion with extensive vasogenic edema in the right occipital lobe. There is associated sclerotic bony metastasis in the overlying right parietal bone. Visualized orbits: Negative Mastoids and visualized paranasal sinuses: Mild mucosal edema paranasal sinuses. Negative orbit Skeleton: Sclerotic lesions in the cervical spine and sternum  compatible with metastatic disease. Sclerotic lesion right parietal bone compatible with metastatic disease. Upper chest: Lung apices clear bilaterally appear Other: None IMPRESSION: Cervical lymphadenopathy left greater than right compatible with metastatic disease. Largest left supraclavicular node measures 28 x 34 mm. Skeletal metastatic disease cervical spine, sternum, right parietal bone Enhancing mass lesion right occipital parietal lobe. Peripheral mass lesion is likely associated with bony metastatic disease extending into the dura. There is considerable white matter edema. No brain mass identified on the prior CT head 12/29/2017. Electronically Signed   By: Franchot Gallo M.D.   On: 03/30/2019 16:16   Ct Chest W Contrast  Result Date: 03/30/2019 CLINICAL DATA:  Left upper quadrant pain since last night. History of metastatic prostate cancer. EXAM: CT CHEST, ABDOMEN, AND PELVIS WITH CONTRAST TECHNIQUE: Multidetector CT imaging of the chest, abdomen and pelvis was performed following the standard protocol during bolus administration of intravenous contrast. CONTRAST:  113mL OMNIPAQUE IOHEXOL 300 MG/ML  SOLN COMPARISON:  CT chest, abdomen, and pelvis dated Dec 22, 2018. FINDINGS: CT CHEST FINDINGS Cardiovascular: Normal heart size. No pericardial effusion. No thoracic aortic aneurysm or dissection. Coronary, aortic arch, and branch vessel  atherosclerotic vascular disease. No central pulmonary embolism. Mediastinum/Nodes: Enlarging left supraclavicular lymph nodes measuring up to 1.8 cm, previously 10 mm. New mildly enlarged right paratracheal lymph node measuring 1.2 cm in short axis. Unchanged enlarged retrocrural lymph nodes measuring up to 15 mm. No enlarged axillary or hilar lymph nodes. The thyroid gland, trachea, and esophagus are unremarkable. Lungs/Pleura: Subsegmental atelectasis in both lower lobes. No focal consolidation, pleural effusion, or pneumothorax. Unchanged 10 mm ground-glass nodule in  the left lower lobe (series 9, image 111). Musculoskeletal: Bilateral gynecomastia. Sclerotic lesions noted in the sternum, ribs, left scapula, and thoracic spine are not significantly changed. Old bilateral rib fractures again noted. CT ABDOMEN PELVIS FINDINGS Hepatobiliary: No focal liver abnormality is seen. No gallstones, gallbladder wall thickening, or biliary dilatation. Pancreas: Unremarkable. No pancreatic ductal dilatation or surrounding inflammatory changes. Spleen: Normal in size without focal abnormality. Adrenals/Urinary Tract: Adrenal glands are unremarkable. Kidneys are normal, without renal calculi, focal lesion, or hydronephrosis. Bladder is decompressed. Stomach/Bowel: Stomach is within normal limits. Appendix is normal. No evidence of bowel wall thickening, distention, or inflammatory changes. Mild left-sided colonic diverticulosis again noted. Vascular/Lymphatic: Bulky lymphadenopathy in the upper abdomen and retroperitoneum has increased in size. For example, a hepatoduodenal ligament lymph node now measures 4.3 cm in short axis, previously 2.5 cm. Right inguinal lymph nodes have increased in size, now measuring up to 2.5 cm, previously 1.6 cm. The left external iliac lymph node now measures 1.6 cm in short axis, previously 0.7 cm. Aortic atherosclerosis. Reproductive: Brachytherapy seeds again noted in the prostate gland. Other: Increased mild presacral stranding. Unchanged small bilateral fat containing inguinal hernias. No free fluid or pneumoperitoneum. Musculoskeletal: Sclerotic lesions in the lumbar spine and both iliac bones are grossly unchanged. IMPRESSION: 1.  No acute intrathoracic or intra-abdominal process. 2. Progressive left supraclavicular, upper abdominal, retroperitoneal, and right inguinal nodal metastatic disease. 3. Grossly unchanged osseous metastases. 4. Unchanged 10 mm ground-glass nodule in the left lower lobe. Initial follow-up with CT in 9 months is recommended to  confirm persistence. If persistent, repeat CT is recommended every 2 years until 5 years of stability has been established. This recommendation follows the consensus statement: Guidelines for Management of Incidental Pulmonary Nodules Detected on CT Images: From the Fleischner Society 2017; Radiology 2017; 284:228-243. 5.  Aortic atherosclerosis (ICD10-I70.0). Electronically Signed   By: Titus Dubin M.D.   On: 03/30/2019 16:25   Ct Abdomen Pelvis W Contrast  Result Date: 03/30/2019 CLINICAL DATA:  Left upper quadrant pain since last night. History of metastatic prostate cancer. EXAM: CT CHEST, ABDOMEN, AND PELVIS WITH CONTRAST TECHNIQUE: Multidetector CT imaging of the chest, abdomen and pelvis was performed following the standard protocol during bolus administration of intravenous contrast. CONTRAST:  130mL OMNIPAQUE IOHEXOL 300 MG/ML  SOLN COMPARISON:  CT chest, abdomen, and pelvis dated Dec 22, 2018. FINDINGS: CT CHEST FINDINGS Cardiovascular: Normal heart size. No pericardial effusion. No thoracic aortic aneurysm or dissection. Coronary, aortic arch, and branch vessel atherosclerotic vascular disease. No central pulmonary embolism. Mediastinum/Nodes: Enlarging left supraclavicular lymph nodes measuring up to 1.8 cm, previously 10 mm. New mildly enlarged right paratracheal lymph node measuring 1.2 cm in short axis. Unchanged enlarged retrocrural lymph nodes measuring up to 15 mm. No enlarged axillary or hilar lymph nodes. The thyroid gland, trachea, and esophagus are unremarkable. Lungs/Pleura: Subsegmental atelectasis in both lower lobes. No focal consolidation, pleural effusion, or pneumothorax. Unchanged 10 mm ground-glass nodule in the left lower lobe (series 9, image 111). Musculoskeletal: Bilateral gynecomastia. Sclerotic  lesions noted in the sternum, ribs, left scapula, and thoracic spine are not significantly changed. Old bilateral rib fractures again noted. CT ABDOMEN PELVIS FINDINGS  Hepatobiliary: No focal liver abnormality is seen. No gallstones, gallbladder wall thickening, or biliary dilatation. Pancreas: Unremarkable. No pancreatic ductal dilatation or surrounding inflammatory changes. Spleen: Normal in size without focal abnormality. Adrenals/Urinary Tract: Adrenal glands are unremarkable. Kidneys are normal, without renal calculi, focal lesion, or hydronephrosis. Bladder is decompressed. Stomach/Bowel: Stomach is within normal limits. Appendix is normal. No evidence of bowel wall thickening, distention, or inflammatory changes. Mild left-sided colonic diverticulosis again noted. Vascular/Lymphatic: Bulky lymphadenopathy in the upper abdomen and retroperitoneum has increased in size. For example, a hepatoduodenal ligament lymph node now measures 4.3 cm in short axis, previously 2.5 cm. Right inguinal lymph nodes have increased in size, now measuring up to 2.5 cm, previously 1.6 cm. The left external iliac lymph node now measures 1.6 cm in short axis, previously 0.7 cm. Aortic atherosclerosis. Reproductive: Brachytherapy seeds again noted in the prostate gland. Other: Increased mild presacral stranding. Unchanged small bilateral fat containing inguinal hernias. No free fluid or pneumoperitoneum. Musculoskeletal: Sclerotic lesions in the lumbar spine and both iliac bones are grossly unchanged. IMPRESSION: 1.  No acute intrathoracic or intra-abdominal process. 2. Progressive left supraclavicular, upper abdominal, retroperitoneal, and right inguinal nodal metastatic disease. 3. Grossly unchanged osseous metastases. 4. Unchanged 10 mm ground-glass nodule in the left lower lobe. Initial follow-up with CT in 9 months is recommended to confirm persistence. If persistent, repeat CT is recommended every 2 years until 5 years of stability has been established. This recommendation follows the consensus statement: Guidelines for Management of Incidental Pulmonary Nodules Detected on CT Images: From the  Fleischner Society 2017; Radiology 2017; 284:228-243. 5.  Aortic atherosclerosis (ICD10-I70.0). Electronically Signed   By: Titus Dubin M.D.   On: 03/30/2019 16:25    Microbiology: Recent Results (from the past 240 hour(s))  SARS CORONAVIRUS 2 (TAT 6-12 HRS) Nasal Swab Aptima Multi Swab     Status: None   Collection Time: 03/30/19  4:53 PM   Specimen: Aptima Multi Swab; Nasal Swab  Result Value Ref Range Status   SARS Coronavirus 2 NEGATIVE NEGATIVE Final    Comment: (NOTE) SARS-CoV-2 target nucleic acids are NOT DETECTED. The SARS-CoV-2 RNA is generally detectable in upper and lower respiratory specimens during the acute phase of infection. Negative results do not preclude SARS-CoV-2 infection, do not rule out co-infections with other pathogens, and should not be used as the sole basis for treatment or other patient management decisions. Negative results must be combined with clinical observations, patient history, and epidemiological information. The expected result is Negative. Fact Sheet for Patients: SugarRoll.be Fact Sheet for Healthcare Providers: https://www.woods-mathews.com/ This test is not yet approved or cleared by the [place] FDA and  has been authorized for detection and/or diagnosis of SARS-CoV-2 by FDA under an Emergency Use Authorization (EUA). This EUA will remain  in effect (meaning this test can be used) for the duration of the COVID-19 declaration under Section 56 4(b)(1) of the Act, 21 U.S.C. section 360bbb-3(b)(1), unless the authorization is terminated or revoked sooner. Performed at [facility], [place] [address], [place], [place] [address]      Labs: Basic Metabolic Panel: Recent Labs  Lab 03/30/19 1223 03/31/19 0515 04/02/19 0553  NA 135 135 136  K 3.6 3.9 3.9  CL 99 100 98  CO2 25 24 27   GLUCOSE 200* 179* 157*  BUN 11 10  20  CREATININE 0.79 0.64 0.67  CALCIUM 8.7* 9.2 9.4   Liver  Function Tests: Recent Labs  Lab 03/30/19 1223 03/31/19 0515  AST 18 13*  ALT 10 8  ALKPHOS 89 82  BILITOT 0.6 0.5  PROT 7.3 7.2  ALBUMIN 3.6 3.3*   Recent Labs  Lab 03/30/19 1223  LIPASE 20   No results for input(s): AMMONIA in the last 168 hours. CBC: Recent Labs  Lab 03/30/19 1223 03/31/19 0515 04/02/19 0553  WBC 9.3 8.3 11.0*  NEUTROABS  --   --  9.8*  HGB 10.2* 9.5* 9.9*  HCT 32.5* 30.3* 31.3*  MCV 93.7 94.7 93.4  PLT 274 284 355   Cardiac Enzymes: No results for input(s): CKTOTAL, CKMB, CKMBINDEX, TROPONINI in the last 168 hours. BNP: BNP (last 3 results) No results for input(s): BNP in the last 8760 hours.  ProBNP (last 3 results) No results for input(s): PROBNP in the last 8760 hours.  CBG: No results for input(s): GLUCAP in the last 168 hours.     Signed:  Nita Sells MD   [facility] 04/03/2019, 8:58 AM

## 2019-04-03 NOTE — TOC Initial Note (Signed)
Transition of Care (TOC) - Initial/Assessment Note    Patient Details  Name: Alejandro [name]. MRN: [id number] Date of Birth: [birth year]  Transition of Care (TOC) CM/SW Contact:    Joaquin Courts, RN Phone Number: 04/03/2019, 10:01 AM  Clinical Narrative:                 CM spoke with patient who declines [facility] services, reports he is planning to have a meeting with hospice to determine which of their services will meet his needs. Patient reports he has rolling walker at home and does not need any equipment.   Expected Discharge Plan: Home/Self Care Barriers to Discharge: No Barriers Identified   Patient Goals and CMS Choice        Expected Discharge Plan and Services Expected Discharge Plan: Home/Self Care   Discharge Planning Services: CM Consult   Living arrangements for the past 2 months: Apartment Expected Discharge Date: 04/03/19               DME Arranged: N/A DME Agency: NA       HH Arranged: Refused [place] Agency: NA        Prior Living Arrangements/Services Living arrangements for the past 2 months: Apartment Lives with:: Self Patient language and need for interpreter reviewed:: Yes        Need for Family Participation in Patient Care: Yes (Comment) Care giver support system in place?: Yes (comment)   Criminal Activity/Legal Involvement Pertinent to Current Situation/Hospitalization: No - Comment as needed  Activities of Daily Living Home Assistive Devices/Equipment: None ADL Screening (condition at time of admission) Patient's cognitive ability adequate to safely complete daily activities?: Yes Is the patient deaf or have difficulty hearing?: No Does the patient have difficulty seeing, even when wearing glasses/contacts?: No Does the patient have difficulty concentrating, remembering, or making decisions?: No Patient able to express need for assistance with ADLs?: No Does the patient have difficulty dressing or bathing?:  No Independently performs ADLs?: Yes (appropriate for developmental age) Does the patient have difficulty walking or climbing stairs?: No Weakness of Legs: None Weakness of Arms/Hands: None  Permission Sought/Granted                  Emotional Assessment Appearance:: Appears stated age Attitude/Demeanor/Rapport: Engaged Affect (typically observed): Accepting Orientation: : Oriented to Place, Oriented to  Time, Oriented to Situation, Oriented to Self   Psych Involvement: No (comment)  Admission diagnosis:  Generalized abdominal pain [R10.84] Diffuse lymphadenopathy [R59.1] Intractable pain [R52] Prostate cancer metastatic to bone (HCC) [C61, C79.51] Vasogenic edema (HCC) [G93.6] Cancer associated pain [G89.3] Patient Active Problem List   Diagnosis Date Noted  . Cancer related pain 03/30/2019  . Cancer associated pain 03/30/2019  . 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 Atrial fibrillation ([place]) 01/13/2019  . Orthostatic hypotension 12/03/2018  . Abdominal aortic atherosclerosis ([place]): Severe calcification and ectasia 12/03/2018  . Encounter for antineoplastic chemotherapy 12/04/2017  . Prostate cancer metastatic to bone ([place]) 11/19/2017  . Fever in adult 11/19/2017  . Mucositis due to antineoplastic therapy 11/19/2017  . Lactic acid acidosis 11/19/2017  . Fever 11/19/2017  . Neutropenic fever ([place]) 10/30/2017  . 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 OSA on CPAP 05/31/2013  . DYSPNEA 01/23/2010  . CHEST PAIN-UNSPECIFIED 01/23/2010  . ANXIETY 12/10/2009  . Osteoarthrosis involving lower  leg 12/10/2009  . INSOMNIA 12/10/2009  . HYPERCHOLESTEROLEMIA 12/07/2009  . Essential hypertension 12/07/2009  . Aortic valve disorder 12/07/2009  . CAD, native coronary artery: Occluded RCA ([place]) 2009 10/13/2007   PCP:  [name],  MD Pharmacy:   [facility] [place], [place] [address] Phone: [phone] Fax: [phone]     Social Determinants of Health (SDOH) Interventions    Readmission Risk Interventions No flowsheet data found.

## 2019-04-04 ENCOUNTER — Other Ambulatory Visit: Payer: Self-pay

## 2019-04-04 ENCOUNTER — Other Ambulatory Visit: Payer: Self-pay | Admitting: Oncology

## 2019-04-04 ENCOUNTER — Telehealth: Payer: Self-pay | Admitting: Radiation Oncology

## 2019-04-04 ENCOUNTER — Ambulatory Visit
Admission: RE | Admit: 2019-04-04 | Discharge: 2019-04-04 | Disposition: A | Discharge status: Home / Self Care | Payer: BC Managed Care – PPO | Source: Ambulatory Visit | Attending: Radiation Oncology | Admitting: Radiation Oncology

## 2019-04-04 DIAGNOSIS — C778 Secondary and unspecified malignant neoplasm of lymph nodes of multiple regions: Secondary | ICD-10-CM | POA: Diagnosis not present

## 2019-04-04 DIAGNOSIS — Z510 Encounter for antineoplastic radiation therapy: Secondary | ICD-10-CM | POA: Diagnosis not present

## 2019-04-04 DIAGNOSIS — C61 Malignant neoplasm of prostate: Secondary | ICD-10-CM | POA: Diagnosis not present

## 2019-04-04 MED ORDER — HYDROMORPHONE HCL 4 MG PO TABS: ORAL_TABLET | ORAL | 0 refills | Status: DC

## 2019-04-04 MED ORDER — MORPHINE SULFATE ER 15 MG PO TBCR: 15.0000 mg | EXTENDED_RELEASE_TABLET | Freq: Two times a day (BID) | ORAL | 0 refills | Status: DC

## 2019-04-04 NOTE — Telephone Encounter (Signed)
Phoned patient as requested by Dr. Tammi Klippel to inquire about refills needed for opiates. Patient states, "I just got off the phone with Dr. Hazeline Junker office." Patient explains that the liquid dilaudid given to him while hospitalized isn't available at his pharmacy. Patient explained that Dr. Alen Blew plans to write for a 3 day supply of immediate release morphine. Patient questions what will happen once those three days are up. Reassured patient this RN will inform Dr. Tammi Klippel of these findings and work toward a resolution. Patient understands this RN will phone him back with further directions after consulting with Dr. Tammi Klippel.

## 2019-04-05 ENCOUNTER — Encounter (HOSPITAL_COMMUNITY): Payer: Self-pay

## 2019-04-05 ENCOUNTER — Observation Stay (HOSPITAL_COMMUNITY)
Admission: EM | Admit: 2019-04-05 | Discharge: 2019-04-06 | Disposition: A | Discharge status: Home / Self Care | Payer: BC Managed Care – PPO | Attending: Internal Medicine | Admitting: Internal Medicine

## 2019-04-05 ENCOUNTER — Ambulatory Visit
Admission: RE | Admit: 2019-04-05 | Discharge: 2019-04-05 | Disposition: A | Discharge status: Home / Self Care | Payer: BC Managed Care – PPO | Source: Ambulatory Visit | Attending: Radiation Oncology | Admitting: Radiation Oncology

## 2019-04-05 ENCOUNTER — Ambulatory Visit (HOSPITAL_BASED_OUTPATIENT_CLINIC_OR_DEPARTMENT_OTHER)
Admission: RE | Admit: 2019-04-05 | Discharge: 2019-04-05 | Disposition: A | Discharge status: Home / Self Care | Payer: BC Managed Care – PPO | Source: Ambulatory Visit | Attending: Radiation Oncology | Admitting: Radiation Oncology

## 2019-04-05 ENCOUNTER — Emergency Department (HOSPITAL_COMMUNITY): Payer: BC Managed Care – PPO

## 2019-04-05 ENCOUNTER — Other Ambulatory Visit: Payer: Self-pay

## 2019-04-05 DIAGNOSIS — G893 Neoplasm related pain (acute) (chronic): Secondary | ICD-10-CM

## 2019-04-05 DIAGNOSIS — R531 Weakness: Secondary | ICD-10-CM | POA: Diagnosis not present

## 2019-04-05 DIAGNOSIS — C7931 Secondary malignant neoplasm of brain: Secondary | ICD-10-CM | POA: Diagnosis not present

## 2019-04-05 DIAGNOSIS — R42 Dizziness and giddiness: Secondary | ICD-10-CM

## 2019-04-05 DIAGNOSIS — G4733 Obstructive sleep apnea (adult) (pediatric): Secondary | ICD-10-CM | POA: Insufficient documentation

## 2019-04-05 DIAGNOSIS — E7800 Pure hypercholesterolemia, unspecified: Secondary | ICD-10-CM | POA: Diagnosis not present

## 2019-04-05 DIAGNOSIS — R2681 Unsteadiness on feet: Secondary | ICD-10-CM | POA: Insufficient documentation

## 2019-04-05 DIAGNOSIS — Z79899 Other long term (current) drug therapy: Secondary | ICD-10-CM | POA: Diagnosis not present

## 2019-04-05 DIAGNOSIS — C61 Malignant neoplasm of prostate: Secondary | ICD-10-CM | POA: Insufficient documentation

## 2019-04-05 DIAGNOSIS — Z87891 Personal history of nicotine dependence: Secondary | ICD-10-CM | POA: Diagnosis not present

## 2019-04-05 DIAGNOSIS — C7951 Secondary malignant neoplasm of bone: Secondary | ICD-10-CM | POA: Diagnosis not present

## 2019-04-05 DIAGNOSIS — I480 Paroxysmal atrial fibrillation: Secondary | ICD-10-CM | POA: Diagnosis not present

## 2019-04-05 DIAGNOSIS — Z66 Do not resuscitate: Secondary | ICD-10-CM

## 2019-04-05 DIAGNOSIS — R001 Bradycardia, unspecified: Secondary | ICD-10-CM | POA: Diagnosis not present

## 2019-04-05 DIAGNOSIS — C7989 Secondary malignant neoplasm of other specified sites: Secondary | ICD-10-CM | POA: Insufficient documentation

## 2019-04-05 DIAGNOSIS — I2510 Atherosclerotic heart disease of native coronary artery without angina pectoris: Secondary | ICD-10-CM | POA: Diagnosis not present

## 2019-04-05 DIAGNOSIS — F419 Anxiety disorder, unspecified: Secondary | ICD-10-CM | POA: Insufficient documentation

## 2019-04-05 DIAGNOSIS — C770 Secondary and unspecified malignant neoplasm of lymph nodes of head, face and neck: Secondary | ICD-10-CM | POA: Diagnosis not present

## 2019-04-05 DIAGNOSIS — Z8042 Family history of malignant neoplasm of prostate: Secondary | ICD-10-CM | POA: Diagnosis not present

## 2019-04-05 DIAGNOSIS — E860 Dehydration: Secondary | ICD-10-CM | POA: Insufficient documentation

## 2019-04-05 DIAGNOSIS — Z8546 Personal history of malignant neoplasm of prostate: Secondary | ICD-10-CM | POA: Insufficient documentation

## 2019-04-05 DIAGNOSIS — Z515 Encounter for palliative care: Secondary | ICD-10-CM | POA: Diagnosis not present

## 2019-04-05 DIAGNOSIS — D630 Anemia in neoplastic disease: Secondary | ICD-10-CM | POA: Insufficient documentation

## 2019-04-05 DIAGNOSIS — Z885 Allergy status to narcotic agent status: Secondary | ICD-10-CM | POA: Insufficient documentation

## 2019-04-05 DIAGNOSIS — Z791 Long term (current) use of non-steroidal anti-inflammatories (NSAID): Secondary | ICD-10-CM | POA: Diagnosis not present

## 2019-04-05 DIAGNOSIS — I10 Essential (primary) hypertension: Secondary | ICD-10-CM | POA: Diagnosis not present

## 2019-04-05 DIAGNOSIS — I4891 Unspecified atrial fibrillation: Secondary | ICD-10-CM | POA: Diagnosis present

## 2019-04-05 DIAGNOSIS — M1710 Unilateral primary osteoarthritis, unspecified knee: Secondary | ICD-10-CM | POA: Diagnosis not present

## 2019-04-05 DIAGNOSIS — C778 Secondary and unspecified malignant neoplasm of lymph nodes of multiple regions: Secondary | ICD-10-CM | POA: Insufficient documentation

## 2019-04-05 DIAGNOSIS — Z510 Encounter for antineoplastic radiation therapy: Secondary | ICD-10-CM | POA: Insufficient documentation

## 2019-04-05 DIAGNOSIS — Z20828 Contact with and (suspected) exposure to other viral communicable diseases: Secondary | ICD-10-CM | POA: Insufficient documentation

## 2019-04-05 DIAGNOSIS — Z9989 Dependence on other enabling machines and devices: Secondary | ICD-10-CM

## 2019-04-05 LAB — COMPREHENSIVE METABOLIC PANEL
ALT: 31 U/L (ref 0–44)
AST: 33 U/L (ref 15–41)
Albumin: 3.7 g/dL (ref 3.5–5.0)
Alkaline Phosphatase: 86 U/L (ref 38–126)
Anion gap: 13 (ref 5–15)
BUN: 30 mg/dL — ABNORMAL HIGH (ref 8–23)
CO2: 24 mmol/L (ref 22–32)
Calcium: 8.4 mg/dL — ABNORMAL LOW (ref 8.9–10.3)
Chloride: 96 mmol/L — ABNORMAL LOW (ref 98–111)
Creatinine, Ser: 0.93 mg/dL (ref 0.61–1.24)
GFR calc Af Amer: >60 mL/min (ref >60)
GFR calc non Af Amer: >60 mL/min (ref >60)
Glucose, Bld: 140 mg/dL — ABNORMAL HIGH (ref 70–99)
Potassium: 4 mmol/L (ref 3.5–5.1)
Sodium: 133 mmol/L — ABNORMAL LOW (ref 135–145)
Total Bilirubin: 0.7 mg/dL (ref 0.3–1.2)
Total Protein: 7.5 g/dL (ref 6.5–8.1)

## 2019-04-05 LAB — CBC WITH DIFFERENTIAL/PLATELET
Abs Immature Granulocytes: 0.39 10*3/uL — ABNORMAL HIGH (ref 0.00–0.07)
Basophils Absolute: 0.1 10*3/uL (ref 0.0–0.1)
Basophils Relative: 0 %
Eosinophils Absolute: 0 10*3/uL (ref 0.0–0.5)
Eosinophils Relative: 0 %
HCT: 36.8 % — ABNORMAL LOW (ref 39.0–52.0)
Hemoglobin: 11.5 g/dL — ABNORMAL LOW (ref 13.0–17.0)
Immature Granulocytes: 3 %
Lymphocytes Relative: 4 %
Lymphs Abs: 0.5 10*3/uL — ABNORMAL LOW (ref 0.7–4.0)
MCH: 29.4 pg (ref 26.0–34.0)
MCHC: 31.3 g/dL (ref 30.0–36.0)
MCV: 94.1 fL (ref 80.0–100.0)
Monocytes Absolute: 0.9 10*3/uL (ref 0.1–1.0)
Monocytes Relative: 7 %
Neutro Abs: 9.8 10*3/uL — ABNORMAL HIGH (ref 1.7–7.7)
Neutrophils Relative %: 86 %
Platelets: 407 10*3/uL — ABNORMAL HIGH (ref 150–400)
RBC: 3.91 MIL/uL — ABNORMAL LOW (ref 4.22–5.81)
RDW: 16 % — ABNORMAL HIGH (ref 11.5–15.5)
WBC: 11.6 10*3/uL — ABNORMAL HIGH (ref 4.0–10.5)
nRBC: 0 % (ref 0.0–0.2)

## 2019-04-05 LAB — SARS CORONAVIRUS 2 (TAT 6-24 HRS): SARS Coronavirus 2: NEGATIVE

## 2019-04-05 MED ORDER — MORPHINE SULFATE (PF) 2 MG/ML IV SOLN
2.0000 mg | Freq: Once | INTRAVENOUS | Status: AC
  Administered 2019-04-05: 2 mg via INTRAVENOUS
  Filled 2019-04-05: qty 1

## 2019-04-05 MED ORDER — DEXAMETHASONE 4 MG PO TABS
4.0000 mg | ORAL_TABLET | Freq: Two times a day (BID) | ORAL | Status: DC
  Administered 2019-04-06: 4 mg via ORAL
  Filled 2019-04-05: qty 1

## 2019-04-05 MED ORDER — ONDANSETRON HCL 4 MG/2ML IJ SOLN
4.0000 mg | Freq: Once | INTRAMUSCULAR | Status: AC
  Administered 2019-04-05: 4 mg via INTRAVENOUS
  Filled 2019-04-05: qty 2

## 2019-04-05 MED ORDER — DEXAMETHASONE 4 MG PO TABS
4.0000 mg | ORAL_TABLET | Freq: Two times a day (BID) | ORAL | Status: DC
  Filled 2019-04-05: qty 1

## 2019-04-05 MED ORDER — ENOXAPARIN SODIUM 40 MG/0.4ML ~~LOC~~ SOLN
40.0000 mg | SUBCUTANEOUS | Status: DC
  Filled 2019-04-05: qty 0.4

## 2019-04-05 MED ORDER — DEXAMETHASONE SODIUM PHOSPHATE 4 MG/ML IJ SOLN
4.0000 mg | Freq: Once | INTRAMUSCULAR | Status: AC
  Administered 2019-04-05: 4 mg via INTRAVENOUS
  Filled 2019-04-05: qty 1

## 2019-04-05 MED ORDER — MORPHINE SULFATE (PF) 4 MG/ML IV SOLN
4.0000 mg | Freq: Once | INTRAVENOUS | Status: AC
  Administered 2019-04-05: 4 mg via INTRAVENOUS
  Filled 2019-04-05: qty 1

## 2019-04-05 MED ORDER — AMITRIPTYLINE HCL 10 MG PO TABS
20.0000 mg | ORAL_TABLET | Freq: Every day | ORAL | Status: DC
  Administered 2019-04-05: 20 mg via ORAL
  Filled 2019-04-05 (×2): qty 2

## 2019-04-05 MED ORDER — SODIUM CHLORIDE 0.9 % IV SOLN
Freq: Once | INTRAVENOUS | Status: AC
  Administered 2019-04-05: 14:00:00 via INTRAVENOUS

## 2019-04-05 MED ORDER — TAMSULOSIN HCL 0.4 MG PO CAPS
0.4000 mg | ORAL_CAPSULE | Freq: Two times a day (BID) | ORAL | Status: DC
  Administered 2019-04-05 – 2019-04-06 (×3): 0.4 mg via ORAL
  Filled 2019-04-05 (×3): qty 1

## 2019-04-05 MED ORDER — MORPHINE SULFATE ER 15 MG PO TBCR: 15.0000 mg | EXTENDED_RELEASE_TABLET | Freq: Two times a day (BID) | ORAL | Status: DC

## 2019-04-05 MED ORDER — SODIUM CHLORIDE 0.9 % IV SOLN
INTRAVENOUS | Status: DC
  Administered 2019-04-05: 20:00:00 via INTRAVENOUS

## 2019-04-05 MED ORDER — POLYVINYL ALCOHOL 1.4 % OP SOLN: 1.0000 [drp] | OPHTHALMIC | Status: DC | PRN

## 2019-04-05 MED ORDER — ENSURE ENLIVE PO LIQD
237.0000 mL | Freq: Two times a day (BID) | ORAL | Status: DC
  Administered 2019-04-06: 237 mL via ORAL

## 2019-04-05 MED ORDER — NEBIVOLOL HCL 5 MG PO TABS: 5.0000 mg | ORAL_TABLET | Freq: Every day | ORAL | Status: DC

## 2019-04-05 MED ORDER — MORPHINE SULFATE ER 15 MG PO TBCR
15.0000 mg | EXTENDED_RELEASE_TABLET | Freq: Two times a day (BID) | ORAL | Status: DC
  Administered 2019-04-05 – 2019-04-06 (×3): 15 mg via ORAL
  Filled 2019-04-05 (×3): qty 1

## 2019-04-05 NOTE — Progress Notes (Signed)
Spoke with pt regarding cpap.  Pt stated he does wear it at home but does not want to wear one tonight.  Pt was encouraged to call should he change his mind.

## 2019-04-05 NOTE — ED Notes (Signed)
ED TO INPATIENT HANDOFF REPORT  Name/Age/Gender Alejandro [name]. 63 y.o. male  Code Status    Code Status Orders  (From admission, onward)         Start     Ordered   04/05/19 1458  Do not attempt resuscitation (DNR)  Continuous    Question Answer Comment  In the event of cardiac or respiratory ARREST Do not call a "code blue"   In the event of cardiac or respiratory ARREST Do not perform Intubation, CPR, defibrillation or ACLS   In the event of cardiac or respiratory ARREST Use medication by any route, position, wound care, and other measures to relive pain and suffering. May use oxygen, suction and manual treatment of airway obstruction as needed for comfort.   Comments Patient preference and documented previously      04/05/19 1457        Code Status History    Date Active Date Inactive Code Status Order ID Comments User Context   03/30/2019 1837 04/03/2019 1638 DNR [id number]  Nita Sells, MD Inpatient   01/13/2019 2251 01/14/2019 1538 DNR [id number]  Adrian Prows, MD ED   11/19/2017 0211 11/20/2017 1746 DNR [id number]  Toy Baker, MD Inpatient   10/30/2017 2256 11/02/2017 1358 Full Code [id number]  Phillips Grout, MD ED   Advance Care Planning Activity    Advance Directive Documentation     Most Recent Value  Type of Advance Directive  Healthcare Power of Attorney  Pre-existing out of facility DNR order (yellow form or pink MOST form)  -  "MOST" Form in Place?  -      Home/SNF/Other Home  Chief Complaint headache ca pt  Level of Care/Admitting Diagnosis ED Disposition    ED Disposition Condition [place]: [place] [100102]  Level of Care: Telemetry [5]  Admit to tele based on following criteria: Other see comments  Comments: generalized weakness  Covid Evaluation: Asymptomatic Screening Protocol (No Symptoms)  Diagnosis: Generalized weakness [id number]  Admitting Physician: Hosie Poisson [[id number]]  Attending Physician: Hosie Poisson [[id number]]  PT Class (Do Not Modify): Observation [104]  PT Acc Code (Do Not Modify): Observation [10022]       Medical History Past Medical History:  Diagnosis Date  . ANXIETY   . AORTIC STENOSIS, MILD   . CHEST PAIN-UNSPECIFIED   . DYSPNEA   . HYPERCHOLESTEROLEMIA   . Hypertension   . INSOMNIA   . OSA on CPAP 05/31/2013  . OSTEOARTHRITIS, KNEE   . Prostate cancer (HCC)     Allergies Allergies  Allergen Reactions  . Codeine Nausea Only    IV Location/Drains/Wounds Patient Lines/Drains/Airways Status   Active Line/Drains/Airways    Name:   Placement date:   Placement time:   Site:   Days:   Peripheral IV 04/05/19 Right Antecubital   04/05/19    1224    Antecubital   less than 1          Labs/Imaging Results for orders placed or performed during the hospital encounter of 04/05/19 (from the past 48 hour(s))  Comprehensive metabolic panel     Status: Abnormal   Collection Time: 04/05/19 12:26 PM  Result Value Ref Range   Sodium 133 (L) 135 - 145 mmol/L   Potassium 4.0 3.5 - 5.1 mmol/L   Chloride 96 (L) 98 - 111 mmol/L   CO2 24 22 - 32 mmol/L   Glucose, Bld 140 (H) 70 - 99 mg/dL   BUN  30 (H) 8 - 23 mg/dL   Creatinine, Ser 0.93 0.61 - 1.24 mg/dL   Calcium 8.4 (L) 8.9 - 10.3 mg/dL   Total Protein 7.5 6.5 - 8.1 g/dL   Albumin 3.7 3.5 - 5.0 g/dL   AST 33 15 - 41 U/L   ALT 31 0 - 44 U/L   Alkaline Phosphatase 86 38 - 126 U/L   Total Bilirubin 0.7 0.3 - 1.2 mg/dL   GFR calc non Af Amer >60 >60 mL/min   GFR calc Af Amer >60 >60 mL/min   Anion gap 13 5 - 15    Comment: Performed at [facility], [place] [address], [place], [place] [address]  CBC with Differential     Status: Abnormal   Collection Time: 04/05/19 12:26 PM  Result Value Ref Range   WBC 11.6 (H) 4.0 - 10.5 K/uL   RBC 3.91 (L) 4.22 - 5.81 MIL/uL   Hemoglobin 11.5 (L) 13.0 - 17.0 g/dL   HCT 36.8 (L) 39.0 - 52.0 %   MCV 94.1 80.0 - 100.0 fL   MCH 29.4  26.0 - 34.0 pg   MCHC 31.3 30.0 - 36.0 g/dL   RDW 16.0 (H) 11.5 - 15.5 %   Platelets 407 (H) 150 - 400 K/uL   nRBC 0.0 0.0 - 0.2 %   Neutrophils Relative % 86 %   Neutro Abs 9.8 (H) 1.7 - 7.7 K/uL   Lymphocytes Relative 4 %   Lymphs Abs 0.5 (L) 0.7 - 4.0 K/uL   Monocytes Relative 7 %   Monocytes Absolute 0.9 0.1 - 1.0 K/uL   Eosinophils Relative 0 %   Eosinophils Absolute 0.0 0.0 - 0.5 K/uL   Basophils Relative 0 %   Basophils Absolute 0.1 0.0 - 0.1 K/uL   Immature Granulocytes 3 %   Abs Immature Granulocytes 0.39 (H) 0.00 - 0.07 K/uL    Comment: Performed at [facility], [place] [address], [place], [place] [address]   Ct Head Wo Contrast  Result Date: 04/05/2019 CLINICAL DATA:  Status post fall 10 days ago. New onset seeing bright lights and frontal headache today. Diplopia dizziness, nausea and disorientation. History of prostate cancer. EXAM: CT HEAD WITHOUT CONTRAST TECHNIQUE: Contiguous axial images were obtained from the base of the skull through the vertex without intravenous contrast. COMPARISON:  Head CT 03/30/2019 and neck CT 03/30/2019. FINDINGS: Brain: Again seen is a mass lesion in the right occipital lobe measuring approximately 3.4 cm AP by 1.8 cm transverse on axial image 16 with extensive surrounding vasogenic edema. No new mass is identified. No evidence of acute infarct, hemorrhage, hydrocephalus, pneumocephalus or midline shift. Vascular: Atherosclerosis. Skull: Sclerotic lesion the right parietal bone consistent with metastatic disease identified. A smudge smaller lesion is seen in the left temporal bone. Sinuses/Orbits: Negative. Other: None. IMPRESSION: No acute abnormality or change compared to the prior exam. Enhancing mass lesion in the right parietal lobe likely secondary to metastatic disease. 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 Electronically Signed   By: Inge Rise M.D.   On: 04/05/2019 13:08    Pending Labs Unresulted Labs (From  admission, onward)    Start     Ordered   04/06/19 0500  CBC  Tomorrow morning,   R     04/05/19 1727   04/06/19 [id number]  Basic metabolic panel  Tomorrow morning,   R     04/05/19 1727   04/05/19 1431  SARS CORONAVIRUS 2 (TAT 6-24 HRS) Nasopharyngeal Nasopharyngeal Swab  (  Asymptomatic/Tier 2 Patients Labs)  Once,   STAT    Question Answer Comment  Is this test for diagnosis or screening Screening   Symptomatic for COVID-19 as defined by CDC No   Hospitalized for COVID-19 No   Admitted to ICU for COVID-19 No   Previously tested for COVID-19 No   Resident in a congregate (group) care setting No   Employed in healthcare setting No      04/05/19 1430          Vitals/Pain Today's Vitals   04/05/19 1800 04/05/19 1815 04/05/19 1900 04/05/19 1930  BP: (!) 156/82  116/68 120/71  Pulse: (!) 47  (!) 59 63  Resp: 17 (!) 30 14 14   Temp:      TempSrc:      SpO2: 97%  97% 97%  Weight:      Height:      PainSc:        Isolation Precautions No active isolations  Medications Medications  amitriptyline (ELAVIL) tablet 20 mg (has no administration in time range)  tamsulosin (FLOMAX) capsule 0.4 mg (0.4 mg Oral Given 04/05/19 1605)  polyvinyl alcohol (LIQUIFILM TEARS) 1.4 % ophthalmic solution 1 drop (has no administration in time range)  morphine (MS CONTIN) 12 hr tablet 15 mg (15 mg Oral Given 04/05/19 1927)  dexamethasone (DECADRON) tablet 4 mg (has no administration in time range)  0.9 %  sodium chloride infusion (has no administration in time range)  dexamethasone (DECADRON) injection 4 mg (4 mg Intravenous Given 04/05/19 1232)  morphine 4 MG/ML injection 4 mg (4 mg Intravenous Given 04/05/19 1423)  ondansetron (ZOFRAN) injection 4 mg (4 mg Intravenous Given 04/05/19 1424)  0.9 %  sodium chloride infusion ( Intravenous Stopped 04/05/19 1915)  morphine 2 MG/ML injection 2 mg (2 mg Intravenous Given 04/05/19 1948)    Mobility walks with device

## 2019-04-05 NOTE — Progress Notes (Signed)
Received patient in the clinic following radiation treatment. Patient fell ten days ago. Patient reports new onset this morning of seeing bright lights, a frontal headache 8/10, tinnitus, diplopia, dizziness, nausea and disorientation. Patient states, "these symptoms are similar to those I had with a brain bleed in the past." BP elevated. Heart rate WDL at 51. Patient denies taking bystolic this morning since heart rate was soft. Patient afebrile. Patient confirms taking decadron 2 mg this morning without relief. Informed Ashlyn Bruning, PA-C of this finding. Per Ashlyn's order this RN wheel patient to emergency room for work up hopefully to include stat brain MRI. Assigned room 67 in ED per Chong Sicilian, RN. Provided reports to [place], Therapist, sports at [phone]. Left patient resting in hospital bed with Wadie Lessen and his brother at the bedside. No distress noted.

## 2019-04-05 NOTE — ED Notes (Signed)
Patient transported to CT 

## 2019-04-05 NOTE — H&P (Signed)
History and Physical    Alejandro [name]. [id number] DOB: 1956/01/30 DOA: 04/05/2019  PCP: [name], MD  Patient coming from: Home  I have personally briefly reviewed patient's old medical records in [place]  Chief Complaint: Dizziness and generalized malaise since 2 days  HPI: Alejandro [name]. is a 63 y.o. 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 Alen Blew s/p radiation at [facility],  continues to get radiation treatments under Dr. Tammi Klippel, paroxysmal atrial fibrillation not on anticoagulation, pancytopenia was discharged 2 days ago after being treated for dizziness, presents today for generalized malaise and persistent dizziness.  Patient reports mild headache associated with dizziness, felt unsteady on his feet.  Patient denies any nausea or vomiting or abdominal pain or diarrhea.  He denies any fevers or chills or shortness of breath, cough or chest pain denies any dysuria. Patient reports that he was taking 2 mg of Decadron instead of 4 mg twice daily.  ED Course: He was found to be afebrile bradycardic with a heart rate around 46/min, slightly tachypneic and slightly hypertensive at 169/91.  Lab work significant for sodium of 133 creatinine of 0.93, WBC count 11.6, hemoglobin of 11.5 platelets of 407. Repeat CT of the head without contrast was obtained which showed Enhancing mass lesion in the right parietal lobe likely secondary to metastatic disease. 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  Case was discussed with Dr. Alen Blew and recommend mended admitting the patient for hydration and observation overnight.  Patient was referred to hospitalist service for the same.  Review of Systems: As per HPI otherwise "All others reviewed and are negative,"  Past Medical History:  Diagnosis Date  . ANXIETY   . AORTIC STENOSIS, MILD   . CHEST PAIN-UNSPECIFIED    . DYSPNEA   . HYPERCHOLESTEROLEMIA   . Hypertension   . INSOMNIA   . OSA on CPAP 05/31/2013  . OSTEOARTHRITIS, KNEE   . Prostate cancer [facility])     Past Surgical History:  Procedure Laterality Date  . COLONOSCOPY    . ELBOW SURGERY    . LUMBAR LAMINECTOMY  Late 1990's  . NEUROPLASTY / TRANSPOSITION MEDIAN NERVE AT CARPAL TUNNEL BILATERAL    . PROSTATE BIOPSY     Social history  reports that he quit smoking about 20 years ago. His smoking use included cigars and cigarettes. He has a 75.00 pack-year smoking history. He has never used smokeless tobacco. He reports current alcohol use of about 12.0 standard drinks of alcohol per week. He reports previous drug use. Drug: Cocaine.  Allergies  Allergen Reactions  . Codeine Nausea Only    Family History  Problem Relation Age of Onset  . Prostate cancer Father   . Breast cancer Neg Hx   . Colon cancer Neg Hx     Family history reviewed and not pertinent   Prior to Admission medications   Medication Sig Start Date End Date Taking? Authorizing Provider  acetaminophen (TYLENOL) 325 MG tablet Take 650 mg by mouth every 6 (six) hours as needed for mild pain or fever.   Yes [provider]  amitriptyline (ELAVIL) 10 MG tablet Take 20 mg by mouth at bedtime.   Yes [provider]  chlorproMAZINE (THORAZINE) 10 MG tablet Take 1 tablet (10 mg total) by mouth 2 (two) times daily as needed. 04/03/19  Yes Nita Sells, MD  dexamethasone (DECADRON) 4 MG tablet Take 1 tablet (4 mg total)  by mouth every 12 (twelve) hours. 04/03/19  Yes Nita Sells, MD  ibuprofen (ADVIL) 200 MG tablet Take 1,200 mg by mouth every 6 (six) hours as needed for fever, headache or moderate pain.   Yes [provider]  morphine (MS CONTIN) 15 MG 12 hr tablet Take 1 tablet (15 mg total) by mouth every 12 (twelve) hours. 04/04/19  Yes Wyatt Portela, MD  nebivolol (BYSTOLIC) 5 MG tablet Take 1 tablet (5 mg total) by mouth daily.  04/03/19  Yes Nita Sells, MD  polyvinyl alcohol (LIQUIFILM TEARS) 1.4 % ophthalmic solution Place 1 drop into both eyes as needed for dry eyes.   Yes [provider]  prochlorperazine (COMPAZINE) 10 MG tablet Take 1 tablet (10 mg total) by mouth every 6 (six) hours as needed for nausea or vomiting. 03/18/19  Yes Wyatt Portela, MD  tamsulosin (FLOMAX) 0.4 MG CAPS capsule TAKE 1 CAPSULE BY MOUTH TWICE DAILY 03/31/19  Yes Wyatt Portela, MD  HYDROmorphone (DILAUDID) 4 MG tablet Take 1 to 2 every 3 hrs as needed. Patient not taking: Reported on 04/05/2019 04/04/19   Wyatt Portela, MD    Physical Exam:  Constitutional: NAD, calm, comfortable Vitals:   04/05/19 1500 04/05/19 1530 04/05/19 1600 04/05/19 1630  BP: (!) 155/79 (!) 148/102 (!) 164/96 (!) 150/103  Pulse: (!) 47 (!) 49 (!) 47 (!) 55  Resp: 16 14 15  (!) 23  Temp:      TempSrc:      SpO2: 99% 98% 100% 97%  Weight:      Height:       Eyes: PERRL, lids and conjunctivae normal ENMT: Mucous membranes are dry Neck: normal, supple, Respiratory: clear to auscultation bilaterally, no wheezing, no crackles.No accessory muscle use.  Cardiovascular: Regular rate and rhythm, no murmurs / Abdomen: Soft, nontender, nondistended, bowel sounds are good Musculoskeletal: no clubbing / cyanosis.  No LE edema Skin: no rashes, lesions, ulcers. No induration Neurologic: CN 2-12 grossly intact.  Alert and oriented without any sensory deficits Psychiatric: Normal judgment and insight. Alert and oriented x 3. Normal mood.    Labs on Admission: I have personally reviewed following labs and imaging studies  CBC: Recent Labs  Lab 03/30/19 1223 03/31/19 0515 04/02/19 0553 04/05/19 1226  WBC 9.3 8.3 11.0* 11.6*  NEUTROABS  --   --  9.8* 9.8*  HGB 10.2* 9.5* 9.9* 11.5*  HCT 32.5* 30.3* 31.3* 36.8*  MCV 93.7 94.7 93.4 94.1  PLT 274 284 355 [id number]   Basic Metabolic Panel: Recent Labs  Lab 03/30/19 1223 03/31/19 0515 04/02/19  0553 04/05/19 1226  NA 135 135 136 133*  K 3.6 3.9 3.9 4.0  CL 99 100 98 96*  CO2 25 24 27 24   GLUCOSE 200* 179* 157* 140*  BUN 11 10 20  30*  CREATININE 0.79 0.64 0.67 0.93  CALCIUM 8.7* 9.2 9.4 8.4*   GFR: Estimated Creatinine Clearance: 95.4 mL/min (by C-G formula based on SCr of 0.93 mg/dL). Liver Function Tests: Recent Labs  Lab 03/30/19 1223 03/31/19 0515 04/05/19 1226  AST 18 13* 33  ALT 10 8 31   ALKPHOS 89 82 86  BILITOT 0.6 0.5 0.7  PROT 7.3 7.2 7.5  ALBUMIN 3.6 3.3* 3.7   Recent Labs  Lab 03/30/19 1223  LIPASE 20   No results for input(s): AMMONIA in the last 168 hours. Coagulation Profile: No results for input(s): INR, PROTIME in the last 168 hours. Cardiac Enzymes: No results for input(s): CKTOTAL, CKMB,  CKMBINDEX, TROPONINI in the last 168 hours. BNP (last 3 results) No results for input(s): PROBNP in the last 8760 hours. HbA1C: No results for input(s): HGBA1C in the last 72 hours. CBG: No results for input(s): GLUCAP in the last 168 hours. Lipid Profile: No results for input(s): CHOL, HDL, LDLCALC, TRIG, CHOLHDL, LDLDIRECT in the last 72 hours. Thyroid Function Tests: No results for input(s): TSH, T4TOTAL, FREET4, T3FREE, THYROIDAB in the last 72 hours. Anemia Panel: No results for input(s): VITAMINB12, FOLATE, FERRITIN, TIBC, IRON, RETICCTPCT in the last 72 hours. Urine analysis:    Component Value Date/Time   COLORURINE YELLOW 03/30/2019 1200   APPEARANCEUR CLEAR 03/30/2019 1200   LABSPEC 1.015 03/30/2019 1200   PHURINE 5.0 03/30/2019 1200   GLUCOSEU NEGATIVE 03/30/2019 1200   HGBUR NEGATIVE 03/30/2019 1200   BILIRUBINUR NEGATIVE 03/30/2019 1200   KETONESUR NEGATIVE 03/30/2019 1200   PROTEINUR 30 (A) 03/30/2019 1200   NITRITE NEGATIVE 03/30/2019 1200   LEUKOCYTESUR NEGATIVE 03/30/2019 1200    Radiological Exams on Admission: Ct Head Wo Contrast  Result Date: 04/05/2019 CLINICAL DATA:  Status post fall 10 days ago. New onset seeing  bright lights and frontal headache today. Diplopia dizziness, nausea and disorientation. History of prostate cancer. EXAM: CT HEAD WITHOUT CONTRAST TECHNIQUE: Contiguous axial images were obtained from the base of the skull through the vertex without intravenous contrast. COMPARISON:  Head CT 03/30/2019 and neck CT 03/30/2019. FINDINGS: Brain: Again seen is a mass lesion in the right occipital lobe measuring approximately 3.4 cm AP by 1.8 cm transverse on axial image 16 with extensive surrounding vasogenic edema. No new mass is identified. No evidence of acute infarct, hemorrhage, hydrocephalus, pneumocephalus or midline shift. Vascular: Atherosclerosis. Skull: Sclerotic lesion the right parietal bone consistent with metastatic disease identified. A smudge smaller lesion is seen in the left temporal bone. Sinuses/Orbits: Negative. Other: None. IMPRESSION: No acute abnormality or change compared to the prior exam. Enhancing mass lesion in the right parietal lobe likely secondary to metastatic disease. 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 Electronically Signed   By: Inge Rise M.D.   On: 04/05/2019 13:08    EKG: Not done  Assessment/Plan Active Problems:   Generalized weakness    Generalized weakness/malaise and dizziness Probably secondary to dehydration as his creatinine bumped up from 0.6-0.9 and his blood sample appears to be hemoconcentrated.  Differential also include inadequate decadron use vs bradycardia.  Gently hydrate and restart the patient on oral decadron at 4 mg BID.  Please get orthostatic vital signs in am after adequate hydration.     Bradycardia:  Hold the bystolic as his HR is in [id number].    Paroxysmal atrial fib:  He is in in sinus brady at this time.  Not on any anti coagulation meds.   Metastatic prostate cancer with mets to brain head and neck continue with radiation treatment sessions under Dr. Tammi Klippel.  Next session is tomorrow.   Hypertension:  Sub  optimal, prn hydralazine added.    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 Transfuse to keep hemoglobin greater than 7.  Monitor counts as needed.    OSA  ON CPAP.   Severity of Illness: The appropriate patient status for this patient is OBSERVATION. Observation status is judged to be reasonable and necessary in order to provide the required intensity of service to ensure the patient's safety. The patient's presenting symptoms, physical exam findings, and initial radiographic and laboratory data  in the context of their medical condition is felt to place them at decreased risk for further clinical deterioration. Furthermore, it is anticipated that the patient will be medically stable for discharge from the hospital within 2 midnights of admission. The following factors support the patient status of observation.   " The patient's presenting symptoms include DIZZINESS, and dehydration.     DVT prophylaxis: SCDs Code Status: DNR Family Communication: At bedside Disposition Plan: Pending clinical improvement and PT evaluation Consults called: None Admission status: Observation/telemetry   Hosie Poisson MD [facility] Pager [phone]   If 7PM-7AM, please contact night-coverage [email or web address] Password [email or web address]  04/05/2019, 5:28 PM

## 2019-04-05 NOTE — ED Triage Notes (Signed)
Pt is a current cancer patient and fell 10 days ago and since then, has had dizziness, disorientation, headache. Pt reporting seeing flashing and bright lights in front of his eyes. Pt has a brain bleed in the past and is experiencing similar symptoms.

## 2019-04-05 NOTE — Consult Note (Signed)
Consultation Note Date: 04/05/2019   Patient Name: Alejandro [name].  DOB: October 28, 1955  MRN: [id number]  Age / Sex: 63 y.o., male  PCP: [name], MD Referring Physician: Tyler Pita, MD  Reason for Consultation: Establishing goals of care, Hospice Evaluation, Pain control and Psychosocial/spiritual support  HPI/Patient Profile: 63 y.o. 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 Alen Blew s/p radiation at [facility],  continues to get radiation treatments under Dr. Tammi Klippel, paroxysmal atrial fibrillation not on anticoagulation, pancytopenia was hospitalized and discharged 2 days ago is here today in the radiation oncology clinic for ongoing radiation treatment.  This nurse practitioner asked to see this patient today when he came in for radiation in order to assess for palliative medicine needs and explore his goals of care.  However when he got here he reports new onset symptoms: Diplopia, dizziness, and concerned that he is worried "that I have a brain bleed".  Under Dr. Johny Shears instruction patient was sent to the emergency room for emergency scanning.  Patient faces ongoing treatment option decisions, advanced directive decisions and anticipatory care needs.  Patient lives alone and is supported by his brother/H POA  Clinical Assessment and Goals of Care:  This nurse practitioner Wadie Lessen reviewed medical records received report from team assessed the patient and then met with the patient in the outpatient radiation oncology clinic to introduce the role of palliative medicine into a holistic treatment plan, offer emotional support, and explore the patient's goals of care.  I met the patient and his brother and conversation continued throughout the day as he was being worked up in the  emergency room.  We discussed diagnosis, prognosis, goals of care, end-of-life wishes, disposition and options.  As noted discussion with Dr. Alen Blew (03-14-19) (patient is status post multiple therapies and disease has progressed.  Patient has opted to discontinue all cancer treatment.  Patient understands that he will continue to decline and chooses to focus on quality of life moving forward.. Prognosis is poor with limited life expectancy, he has incurable malignancy that is progressing.)   Concept of Hospice and Palliative Care were discussed  A detailed discussion was had today regarding advanced directives.  Concepts specific to code status, artifical feeding and hydration, continued IV antibiotics and rehospitalization was had.  The difference between a aggressive medical intervention path  and a palliative comfort care path for this patient at this time was had.  Values and goals of care important to patient and family were attempted to be elicited.  MOST form introduced   Questions and concerns addressed.   Family encouraged to call with questions or concerns.    PMT will continue to support holistically.     Patient tells me that his brother/Alejandro [name] is his H POA.  They have documented paperwork and will bring to the [name] for scanning.    SUMMARY OF RECOMMENDATIONS    Code Status/Advance Care Planning:  DNR   Symptom Management:  Pain: For now continue with current medications and once patient is admitted we will clarify medications, dosages and efficacy.   Additional Recommendations (Limitations, Scope, Preferences):  Avoid Hospitalization, No Chemotherapy and No Surgical Procedures  Psycho-social/Spiritual:   Desire for further Chaplaincy support:no  Additional Recommendations: Education on Hospice  Prognosis:   < 6 months  Discharge Planning: To Be Determined     Primary Diagnoses: Present on Admission: **None**   I have reviewed the  medical record, interviewed the patient and family, and examined the patient. The following aspects are pertinent.  Past Medical History:  Diagnosis Date  . ANXIETY   . AORTIC STENOSIS, MILD   . CHEST PAIN-UNSPECIFIED   . DYSPNEA   . HYPERCHOLESTEROLEMIA   . Hypertension   . INSOMNIA   . OSA on CPAP 05/31/2013  . OSTEOARTHRITIS, KNEE   . Prostate cancer [facility])    Social History   Socioeconomic History  . Marital status: Divorced    Spouse name: Not on file  . Number of children: 1  . Years of education: college  . Highest education level: Not on file  Occupational History  . Occupation: CONSULTANT    Employer: [employer]    Comment: No longer working  Social Needs  . Financial resource strain: Not on file  . Food insecurity    Worry: Not on file    Inability: Not on file  . Transportation needs    Medical: Not on file    Non-medical: Not on file  Tobacco Use  . Smoking status: Former Smoker    Packs/day: 3.00    Years: 25.00    Pack years: 75.00    Types: Cigars, Cigarettes    Quit date: 12/03/1998    Years since quitting: 20.3  . Smokeless tobacco: Never Used  . Tobacco comment: Former 3 ppd smoker for 30 years quit heavy smoking 15 years ago  Substance and Sexual Activity  . Alcohol use: Yes    Alcohol/week: 12.0 standard drinks    Types: 12 Standard drinks or equivalent per week    Comment: 2 drinks per day  . Drug use: Not Currently    Types: Cocaine    Comment: in his 21s  . Sexual activity: Yes  Lifestyle  . Physical activity    Days per week: Not on file    Minutes per session: Not on file  . Stress: Not on file  Relationships  . Social Herbalist on phone: Not on file    Gets together: Not on file    Attends religious service: Not on file    Active member of club or organization: Not on file    Attends meetings of clubs or organizations: Not on file    Relationship status: Not on file  Other Topics Concern  . Not on file   Social History Narrative  . Not on file   Family History  Problem Relation Age of Onset  . Prostate cancer Father   . Breast cancer Neg Hx   . Colon cancer Neg Hx    Scheduled Meds: Continuous Infusions: PRN Meds:. Medications Prior to Admission:  Prior to Admission medications   Medication Sig Start Date End Date Taking? Authorizing Provider  acetaminophen (TYLENOL) 325 MG tablet Take 650 mg by mouth every 6 (six) hours as needed for mild pain or fever.    [provider]  amitriptyline (ELAVIL) 10 MG tablet Take 20 mg by mouth at bedtime.  [provider]  chlorproMAZINE (THORAZINE) 10 MG tablet Take 1 tablet (10 mg total) by mouth 2 (two) times daily as needed. 04/03/19   Nita Sells, MD  dexamethasone (DECADRON) 4 MG tablet Take 1 tablet (4 mg total) by mouth every 12 (twelve) hours. 04/03/19   Nita Sells, MD  HYDROmorphone (DILAUDID) 4 MG tablet Take 1 to 2 every 3 hrs as needed. Patient not taking: Reported on 04/05/2019 04/04/19   Wyatt Portela, MD  ibuprofen (ADVIL) 200 MG tablet Take 1,200 mg by mouth every 6 (six) hours as needed for fever, headache or moderate pain.    [provider]  morphine (MS CONTIN) 15 MG 12 hr tablet Take 1 tablet (15 mg total) by mouth every 12 (twelve) hours. 04/04/19   Wyatt Portela, MD  nebivolol (BYSTOLIC) 5 MG tablet Take 1 tablet (5 mg total) by mouth daily. 04/03/19   Nita Sells, MD  polyvinyl alcohol (LIQUIFILM TEARS) 1.4 % ophthalmic solution Place 1 drop into both eyes as needed for dry eyes.    [provider]  prochlorperazine (COMPAZINE) 10 MG tablet Take 1 tablet (10 mg total) by mouth every 6 (six) hours as needed for nausea or vomiting. 03/18/19   Wyatt Portela, MD  tamsulosin (FLOMAX) 0.4 MG CAPS capsule TAKE 1 CAPSULE BY MOUTH TWICE DAILY 03/31/19   Wyatt Portela, MD   Allergies  Allergen Reactions  . Codeine Nausea Only   Review of Systems  Eyes: Positive for  visual disturbance.  Neurological: Positive for dizziness and weakness.    Physical Exam  Vital Signs: There were no vitals taken for this visit.         SpO2:   O2 Device:  O2 Flow Rate: .   IO: Intake/output summary: No intake or output data in the 24 hours ending 04/05/19 1433  LBM:   Baseline Weight:   Most recent weight:       Palliative Assessment/Data:  60%   Discussed with Rad-Onc RN/Samantha  This nurse practitioner will follow-up in the morning with both patient and his family to clarify goals of care and assist with disposition planning.  Time In: 1500 Time Out: 1615 Time Total: 75 minutes Greater than 50%  of this time was spent counseling and coordinating care related to the above assessment and plan.  Signed by: Wadie Lessen, NP   Please contact Palliative Medicine Team phone at [phone] for questions and concerns.  For individual provider: See Shea Evans

## 2019-04-05 NOTE — ED Provider Notes (Signed)
[facility] DEPT Provider Note   CSN: [id number] Arrival date & time: 04/05/19  1136     History   Chief Complaint Chief Complaint  Patient presents with  . Headache    HPI Alejandro Waipio Ac[place] Luth. is a 63 y.o. male.     63 year old male with prior medical history as detailed below presents for evaluation of dizziness, headache, and weakness.  Patient reports that he presented to his radiation therapy session and shortly thereafter he developed his symptoms.  He feels unsteady on his feet.  He reports that the symptoms were fairly well-controlled during his recent hospitalization.  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  He had been on Decadron while in the hospital (4mg  q 6hrs, then 4mg  q 12hrs).  He reports that his last dose of Decadron taken earlier today was 2 mg.    The history is provided by the patient and medical records.  Headache Pain location:  Generalized Quality:  Dull Radiates to:  Does not radiate Onset quality:  Gradual Timing:  Intermittent Progression:  Waxing and waning Chronicity:  Recurrent Relieved by:  Nothing Worsened by:  Nothing   Past Medical History:  Diagnosis Date  . ANXIETY   . AORTIC STENOSIS, MILD   . CHEST PAIN-UNSPECIFIED   . DYSPNEA   . HYPERCHOLESTEROLEMIA   . Hypertension   . INSOMNIA   . OSA on CPAP 05/31/2013  . OSTEOARTHRITIS, KNEE   . Prostate cancer [facility])     Patient Active Problem List   Diagnosis Date Noted  . Cancer related pain 03/30/2019  . Cancer associated pain 03/30/2019  . 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 Atrial fibrillation ([place]) 01/13/2019  . Orthostatic hypotension 12/03/2018  . Abdominal aortic atherosclerosis ([place]): Severe calcification and ectasia 12/03/2018  . Encounter for antineoplastic chemotherapy 12/04/2017  . Prostate cancer metastatic to bone ([place])  11/19/2017  . Fever in adult 11/19/2017  . Mucositis due to antineoplastic therapy 11/19/2017  . Lactic acid acidosis 11/19/2017  . Fever 11/19/2017  . Neutropenic fever ([place]) 10/30/2017  . 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 OSA on CPAP 05/31/2013  . DYSPNEA 01/23/2010  . CHEST PAIN-UNSPECIFIED 01/23/2010  . ANXIETY 12/10/2009  . Osteoarthrosis involving lower leg 12/10/2009  . INSOMNIA 12/10/2009  . HYPERCHOLESTEROLEMIA 12/07/2009  . Essential hypertension 12/07/2009  . Aortic valve disorder 12/07/2009  . CAD, native coronary artery: Occluded RCA (CTO) 2009 10/13/2007    Past Surgical History:  Procedure Laterality Date  . COLONOSCOPY    . ELBOW SURGERY    . LUMBAR LAMINECTOMY  Late 1990's  . NEUROPLASTY / TRANSPOSITION MEDIAN NERVE AT CARPAL TUNNEL BILATERAL    . PROSTATE BIOPSY          Home Medications    Prior to Admission medications   Medication Sig Start Date End Date Taking? Authorizing Provider  chlorproMAZINE (THORAZINE) 10 MG tablet Take 1 tablet (10 mg total) by mouth 2 (two) times daily as needed. 04/03/19   Nita Sells, MD  dexamethasone (DECADRON) 4 MG tablet Take 1 tablet (4 mg total) by mouth every 12 (twelve) hours. 04/03/19   Nita Sells, MD  HYDROmorphone (DILAUDID) 4 MG tablet Take 1 to 2 every 3 hrs as needed. 04/04/19   Wyatt Portela, MD  Melatonin 12 MG  TABS Take 12 mg by mouth at bedtime.    [provider]  morphine (MS CONTIN) 15 MG 12 hr tablet Take 1 tablet (15 mg total) by mouth every 12 (twelve) hours. 04/04/19   Wyatt Portela, MD  nebivolol (BYSTOLIC) 5 MG tablet Take 1 tablet (5 mg total) by mouth daily. 04/03/19   Nita Sells, MD  prochlorperazine (COMPAZINE) 10 MG tablet Take 1 tablet (10 mg total) by mouth every 6 (six) hours as needed for nausea or vomiting. 03/18/19   Wyatt Portela, MD  tamsulosin (FLOMAX) 0.4 MG CAPS  capsule TAKE 1 CAPSULE BY MOUTH TWICE DAILY 03/31/19   Wyatt Portela, MD    Family History Family History  Problem Relation Age of Onset  . Prostate cancer Father   . Breast cancer Neg Hx   . Colon cancer Neg Hx     Social History Social History   Tobacco Use  . Smoking status: Former Smoker    Packs/day: 3.00    Years: 25.00    Pack years: 75.00    Types: Cigars, Cigarettes    Quit date: 12/03/1998    Years since quitting: 20.3  . Smokeless tobacco: Never Used  . Tobacco comment: Former 3 ppd smoker for 30 years quit heavy smoking 15 years ago  Substance Use Topics  . Alcohol use: Yes    Alcohol/week: 12.0 standard drinks    Types: 12 Standard drinks or equivalent per week    Comment: 2 drinks per day  . Drug use: Not Currently    Types: Cocaine    Comment: in his 24s     Allergies   Codeine   Review of Systems Review of Systems  Neurological: Positive for headaches.  All other systems reviewed and are negative.    Physical Exam Updated Vital Signs BP (!) 148/104   Pulse (!) 51   Temp 98.1 F (36.7 C) (Oral)   Resp 13   Ht 5\' 10"  (1.778 m)   Wt 95.3 kg   SpO2 100%   BMI 30.13 kg/m   Physical Exam Vitals signs and nursing note reviewed.  Constitutional:      General: He is not in acute distress.    Appearance: He is well-developed.  HENT:     Head: Normocephalic and atraumatic.  Eyes:     General: No visual field deficit.    Conjunctiva/sclera: Conjunctivae normal.     Pupils: Pupils are equal, round, and reactive to light.  Neck:     Musculoskeletal: Normal range of motion and neck supple.  Cardiovascular:     Rate and Rhythm: Normal rate and regular rhythm.     Heart sounds: Normal heart sounds.  Pulmonary:     Effort: Pulmonary effort is normal. No respiratory distress.     Breath sounds: Normal breath sounds.  Abdominal:     General: There is no distension.     Palpations: Abdomen is soft.     Tenderness: There is no abdominal  tenderness.  Musculoskeletal: Normal range of motion.        General: No deformity.  Skin:    General: Skin is warm and dry.  Neurological:     Mental Status: He is alert and oriented to person, place, and time.     GCS: GCS eye subscore is 4. GCS verbal subscore is 5. GCS motor subscore is 6.     Cranial Nerves: No cranial nerve deficit, dysarthria or facial asymmetry.     Sensory:  No sensory deficit.     Motor: No weakness.     Coordination: Coordination normal.      ED Treatments / Results  Labs (all labs ordered are listed, but only abnormal results are displayed) Labs Reviewed  COMPREHENSIVE METABOLIC PANEL - Abnormal; Notable for the following components:      Result Value   Sodium 133 (*)    Chloride 96 (*)    Glucose, Bld 140 (*)    BUN 30 (*)    Calcium 8.4 (*)    All other components within normal limits  CBC WITH DIFFERENTIAL/PLATELET - Abnormal; Notable for the following components:   WBC 11.6 (*)    RBC 3.91 (*)    Hemoglobin 11.5 (*)    HCT 36.8 (*)    RDW 16.0 (*)    Platelets 407 (*)    Neutro Abs 9.8 (*)    Lymphs Abs 0.5 (*)    Abs Immature Granulocytes 0.39 (*)    All other components within normal limits  SARS CORONAVIRUS 2 (TAT 6-24 HRS)    EKG None  Radiology Ct Head Wo Contrast  Result Date: 04/05/2019 CLINICAL DATA:  Status post fall 10 days ago. New onset seeing bright lights and frontal headache today. Diplopia dizziness, nausea and disorientation. History of prostate cancer. EXAM: CT HEAD WITHOUT CONTRAST TECHNIQUE: Contiguous axial images were obtained from the base of the skull through the vertex without intravenous contrast. COMPARISON:  Head CT 03/30/2019 and neck CT 03/30/2019. FINDINGS: Brain: Again seen is a mass lesion in the right occipital lobe measuring approximately 3.4 cm AP by 1.8 cm transverse on axial image 16 with extensive surrounding vasogenic edema. No new mass is identified. No evidence of acute infarct, hemorrhage,  hydrocephalus, pneumocephalus or midline shift. Vascular: Atherosclerosis. Skull: Sclerotic lesion the right parietal bone consistent with metastatic disease identified. A smudge smaller lesion is seen in the left temporal bone. Sinuses/Orbits: Negative. Other: None. IMPRESSION: No acute abnormality or change compared to the prior exam. Enhancing mass lesion in the right parietal lobe likely secondary to metastatic disease. 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 Electronically Signed   By: Inge Rise M.D.   On: 04/05/2019 13:08    Procedures Procedures (including critical care time)  Medications Ordered in ED Medications  dexamethasone (DECADRON) injection 4 mg (4 mg Intravenous Given 04/05/19 1232)     Initial Impression / Assessment and Plan / ED Course  I have reviewed the triage vital signs and the nursing notes.  Pertinent labs & imaging results that were available during my care of the patient were reviewed by me and considered in my medical decision making (see chart for details).        MDM  Screen complete  Alejandro Fateh Bemiss. was evaluated in Emergency Department on 04/05/2019 for the symptoms described in the history of present illness. He was evaluated in the context of the global COVID-19 pandemic, which necessitated consideration that the patient might be at risk for infection with the SARS-CoV-2 virus that causes COVID-19. Institutional protocols and algorithms that pertain to the evaluation of patients at risk for COVID-19 are in a state of rapid change based on information released by regulatory bodies including the CDC and federal and state organizations. These policies and algorithms were followed during the patient's care in the ED.  Patient is presenting for evaluation of dizziness, headache, weakness.  Patient's symptoms may be secondary to mild dehydration.  This is reflected on his chemistry with elevated BUN and  decreased sodium.  Patient also appears to  be only taking 2 mg of Decadron every 12.  This is not quite what he was directed to do upon his recent discharge.  Case discussed with Dr. Alen Blew of oncology.  He agrees with plan to admit for IV fluids and observation.  Hospitalist service is aware of case and will evaluate for admission.  Final Clinical Impressions(s) / ED Diagnoses   Final diagnoses:  Dehydration  Weakness    ED Discharge Orders    None       Valarie Merino, MD 04/05/19 1435

## 2019-04-06 ENCOUNTER — Ambulatory Visit: Payer: BC Managed Care – PPO | Admitting: Cardiology

## 2019-04-06 ENCOUNTER — Ambulatory Visit
Admission: RE | Admit: 2019-04-06 | Discharge: 2019-04-06 | Disposition: A | Discharge status: Home / Self Care | Payer: BC Managed Care – PPO | Source: Ambulatory Visit | Attending: Radiation Oncology | Admitting: Radiation Oncology

## 2019-04-06 DIAGNOSIS — Z515 Encounter for palliative care: Secondary | ICD-10-CM | POA: Diagnosis not present

## 2019-04-06 DIAGNOSIS — C61 Malignant neoplasm of prostate: Secondary | ICD-10-CM | POA: Diagnosis not present

## 2019-04-06 DIAGNOSIS — C7951 Secondary malignant neoplasm of bone: Secondary | ICD-10-CM

## 2019-04-06 DIAGNOSIS — Z510 Encounter for antineoplastic radiation therapy: Secondary | ICD-10-CM | POA: Diagnosis not present

## 2019-04-06 DIAGNOSIS — R42 Dizziness and giddiness: Secondary | ICD-10-CM

## 2019-04-06 DIAGNOSIS — I10 Essential (primary) hypertension: Secondary | ICD-10-CM

## 2019-04-06 DIAGNOSIS — I480 Paroxysmal atrial fibrillation: Secondary | ICD-10-CM

## 2019-04-06 DIAGNOSIS — C778 Secondary and unspecified malignant neoplasm of lymph nodes of multiple regions: Secondary | ICD-10-CM | POA: Diagnosis not present

## 2019-04-06 DIAGNOSIS — E860 Dehydration: Secondary | ICD-10-CM | POA: Diagnosis not present

## 2019-04-06 DIAGNOSIS — G893 Neoplasm related pain (acute) (chronic): Secondary | ICD-10-CM

## 2019-04-06 DIAGNOSIS — R531 Weakness: Secondary | ICD-10-CM | POA: Diagnosis not present

## 2019-04-06 DIAGNOSIS — Z66 Do not resuscitate: Secondary | ICD-10-CM | POA: Insufficient documentation

## 2019-04-06 LAB — BASIC METABOLIC PANEL
Anion gap: 8 (ref 5–15)
BUN: 27 mg/dL — ABNORMAL HIGH (ref 8–23)
CO2: 26 mmol/L (ref 22–32)
Calcium: 8 mg/dL — ABNORMAL LOW (ref 8.9–10.3)
Chloride: 102 mmol/L (ref 98–111)
Creatinine, Ser: 0.72 mg/dL (ref 0.61–1.24)
GFR calc Af Amer: >60 mL/min (ref >60)
GFR calc non Af Amer: >60 mL/min (ref >60)
Glucose, Bld: 129 mg/dL — ABNORMAL HIGH (ref 70–99)
Potassium: 4.1 mmol/L (ref 3.5–5.1)
Sodium: 136 mmol/L (ref 135–145)

## 2019-04-06 LAB — CBC
HCT: 32.6 % — ABNORMAL LOW (ref 39.0–52.0)
Hemoglobin: 10.3 g/dL — ABNORMAL LOW (ref 13.0–17.0)
MCH: 29.9 pg (ref 26.0–34.0)
MCHC: 31.6 g/dL (ref 30.0–36.0)
MCV: 94.5 fL (ref 80.0–100.0)
Platelets: 354 10*3/uL (ref 150–400)
RBC: 3.45 MIL/uL — ABNORMAL LOW (ref 4.22–5.81)
RDW: 16.2 % — ABNORMAL HIGH (ref 11.5–15.5)
WBC: 11.2 10*3/uL — ABNORMAL HIGH (ref 4.0–10.5)
nRBC: 0 % (ref 0.0–0.2)

## 2019-04-06 MED ORDER — SENNOSIDES-DOCUSATE SODIUM 8.6-50 MG PO TABS: 1.0000 | ORAL_TABLET | Freq: Two times a day (BID) | ORAL | Status: DC

## 2019-04-06 MED ORDER — HYDROMORPHONE HCL 2 MG PO TABS
2.0000 mg | ORAL_TABLET | ORAL | Status: DC | PRN
  Administered 2019-04-06 (×3): 4 mg via ORAL
  Filled 2019-04-06 (×3): qty 2

## 2019-04-06 MED ORDER — MECLIZINE HCL 25 MG PO TABS: 25.0000 mg | ORAL_TABLET | Freq: Three times a day (TID) | ORAL | 0 refills | Status: AC | PRN

## 2019-04-06 MED ORDER — CHLORPROMAZINE HCL 10 MG PO TABS: 10.0000 mg | ORAL_TABLET | Freq: Two times a day (BID) | ORAL | 0 refills | Status: AC | PRN

## 2019-04-06 MED ORDER — MECLIZINE HCL 25 MG PO TABS: 25.0000 mg | ORAL_TABLET | Freq: Three times a day (TID) | ORAL | Status: DC | PRN

## 2019-04-06 MED ORDER — ENSURE ENLIVE PO LIQD: 237.0000 mL | Freq: Two times a day (BID) | ORAL | 12 refills | Status: DC

## 2019-04-06 MED ORDER — ONDANSETRON HCL 4 MG PO TABS: 4.0000 mg | ORAL_TABLET | Freq: Four times a day (QID) | ORAL | Status: DC | PRN

## 2019-04-06 MED ORDER — ONDANSETRON HCL 4 MG/2ML IJ SOLN: 4.0000 mg | Freq: Four times a day (QID) | INTRAMUSCULAR | Status: DC | PRN

## 2019-04-06 MED ORDER — DEXAMETHASONE 4 MG PO TABS: 4.0000 mg | ORAL_TABLET | Freq: Two times a day (BID) | ORAL | 0 refills | Status: DC

## 2019-04-06 NOTE — Discharge Summary (Signed)
Physician Discharge Summary  [facility]. [id number] DOB: January 14, 1956 DOA: 04/05/2019  PCP: [name], MD  Admit date: 04/05/2019 Discharge date: 04/06/2019  Time spent: 50 minutes  Recommendations for Outpatient Follow-up:  1. Patient was discharged home with hospice. 2. Follow-up with Dr. Alen Blew, oncology as previously scheduled.   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 is a 63 y.o. 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 Alen Blew s/p radiation at [facility],  continues to get radiation treatments under Dr. Tammi Klippel, paroxysmal atrial fibrillation not on anticoagulation, pancytopenia was discharged 2 days ago after being treated for dizziness, presents today for generalized malaise and persistent dizziness.  Patient reports mild headache associated with dizziness, felt unsteady on his feet.  Patient denies any nausea or vomiting or abdominal pain or diarrhea.  He denies any fevers or chills or shortness of breath, cough or chest pain denies any dysuria. Patient reports that he was taking 2 mg of Decadron instead of 4 mg twice daily.  ED Course: He was found to be afebrile bradycardic with a heart rate around  46/min, slightly tachypneic and slightly hypertensive at 169/91.  Lab work significant for sodium of 133 creatinine of 0.93, WBC count 11.6, hemoglobin of 11.5 platelets of 407. Repeat CT of the head without contrast was obtained which showed Enhancing mass lesion in the right parietal lobe likely secondary to metastatic disease. 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  Case was discussed with Dr. Alen Blew and recommend mended admitting the patient for hydration and observation overnight.  Patient was referred to hospitalist service for the same.  Hospital Course:  1 generalized weakness/malaise and dizziness Patient was admitted with generalized weakness malaise and dizziness felt secondary to dehydration as patient was noted to be hemoconcentrated with a slight bump in his creatinine.  Patient was placed on IV fluid and resumed back on prior discharge Decadron to 4 mg twice daily.  Patient improved clinically in terms of his generalized weakness.  Malaise and dizziness improved.  Repeat head CT which was done on admission with no significant change.  Patient was also placed on meclizine as needed for possible vertiginous component.  Patient noted to have been discharged last hospitalization with hospice following.  Palliative care was consulted and saw the patient during the hospitalization.  Patient was discharged home in stable and improved condition with hospice following.  2.  Bradycardia Patient's by systolic was held.  Heart rate improved.  Patient was discharged back home on his by systolic.  Outpatient follow-up with cardiology as previously scheduled.  3.  Paroxysmal atrial fibrillation Patient noted to be in normal sinus rhythm.  Patient was maintained on by systolic for rate control.  Patient noted not to be on  anticoagulation.  Patient was being discharged home with hospice following.  Outpatient follow-up.  4.  Metastatic prostate cancer with mets to the brain, head and neck Palliative  care was consulted and followed the patient during the hospitalization.  Patient was set up with follow-up with hospice on discharge home.  Patient will follow-up with oncology in the outpatient setting.  5.  Hypertension Remained stable.  The rest of patient's chronic medical issues remained stable throughout the hospitalization and patient be discharged in stable and improved condition.  Procedures:  CT head 04/05/2019  Consultations:  Palliative care: Wadie Lessen, NP 04/06/2019  Discharge Exam: Vitals:   04/05/19 2138 04/06/19 0519  BP: 125/77 (!) 152/94  Pulse: (!) 56 (!) 53  Resp: 18 18  Temp: 98.1 F (36.7 C) 97.8 F (36.6 C)  SpO2: 94% 96%    General: NAD Cardiovascular: RRR Respiratory: CTAB  Discharge Instructions   Discharge Instructions    Diet general   Complete by: As directed    Increase activity slowly   Complete by: As directed      Allergies as of 04/06/2019      Reactions   Codeine Nausea Only      Medication List    TAKE these medications   acetaminophen 325 MG tablet Commonly known as: TYLENOL Take 650 mg by mouth every 6 (six) hours as needed for mild pain or fever.   amitriptyline 10 MG tablet Commonly known as: ELAVIL Take 20 mg by mouth at bedtime.   chlorproMAZINE 10 MG tablet Commonly known as: THORAZINE Take 1 tablet (10 mg total) by mouth 2 (two) times daily as needed for hiccoughs. What changed: reasons to take this   dexamethasone 4 MG tablet Commonly known as: DECADRON Take 1 tablet (4 mg total) by mouth every 12 (twelve) hours.   feeding supplement (ENSURE ENLIVE) Liqd Take 237 mLs by mouth 2 (two) times daily between meals.   HYDROmorphone 4 MG tablet Commonly known as: Dilaudid Take 1 to 2 every 3 hrs as needed.   ibuprofen 200 MG tablet Commonly known as: ADVIL Take 1,200 mg by mouth every 6 (six) hours as needed for fever, headache or moderate pain.   meclizine 25 MG tablet Commonly known as: ANTIVERT Take 1  tablet (25 mg total) by mouth 3 (three) times daily as needed for dizziness (vertigo).   morphine 15 MG 12 hr tablet Commonly known as: MS CONTIN Take 1 tablet (15 mg total) by mouth every 12 (twelve) hours.   nebivolol 5 MG tablet Commonly known as: Bystolic Take 1 tablet (5 mg total) by mouth daily.   polyvinyl alcohol 1.4 % ophthalmic solution Commonly known as: LIQUIFILM TEARS Place 1 drop into both eyes as needed for dry eyes.   prochlorperazine 10 MG tablet Commonly known as: COMPAZINE Take 1 tablet (10 mg total) by mouth every 6 (six) hours as needed for nausea or vomiting.   senna-docusate 8.6-50 MG tablet Commonly known as: Senokot-S Take 1 tablet by mouth 2 (two) times daily.   tamsulosin 0.4 MG Caps capsule Commonly known as: FLOMAX TAKE 1 CAPSULE BY MOUTH TWICE DAILY      Allergies  Allergen Reactions  . Codeine Nausea Only   Follow-up Information    Wyatt Portela, MD Follow up.   Specialty: Oncology Why: f/u as scheduled. Contact information: [place] [id number] [phone]            The results of significant diagnostics from this  hospitalization (including imaging, microbiology, ancillary and laboratory) are listed below for reference.    Significant Diagnostic Studies: Ct Head Wo Contrast  Result Date: 04/05/2019 CLINICAL DATA:  Status post fall 10 days ago. New onset seeing bright lights and frontal headache today. Diplopia dizziness, nausea and disorientation. History of prostate cancer. EXAM: CT HEAD WITHOUT CONTRAST TECHNIQUE: Contiguous axial images were obtained from the base of the skull through the vertex without intravenous contrast. COMPARISON:  Head CT 03/30/2019 and neck CT 03/30/2019. FINDINGS: Brain: Again seen is a mass lesion in the right occipital lobe measuring approximately 3.4 cm AP by 1.8 cm transverse on axial image 16 with extensive surrounding vasogenic edema. No new mass is identified. No  evidence of acute infarct, hemorrhage, hydrocephalus, pneumocephalus or midline shift. Vascular: Atherosclerosis. Skull: Sclerotic lesion the right parietal bone consistent with metastatic disease identified. A smudge smaller lesion is seen in the left temporal bone. Sinuses/Orbits: Negative. Other: None. IMPRESSION: No acute abnormality or change compared to the prior exam. Enhancing mass lesion in the right parietal lobe likely secondary to metastatic disease. 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 Electronically Signed   By: Inge Rise M.D.   On: 04/05/2019 13:08   Ct Head Wo Contrast  Result Date: 03/30/2019 CLINICAL DATA:  Altered level of consciousness. History of metastatic prostate cancer. Known right occipital mass treated with radiation therapy in 01/2019. EXAM: CT HEAD WITHOUT CONTRAST TECHNIQUE: Contiguous axial images were obtained from the base of the skull through the vertex without intravenous contrast. COMPARISON:  12/29/2017 head CT. 12/23/2018 nuclear medicine whole body bone scan. FINDINGS: Brain: There is an approximately 3.2 x 1.8 x 3.2 cm mass in the posterior right occipital region which is heterogeneously hyperdense which may reflect mineralization versus petechial type hemorrhage. It is not completely clear whether the mass is extra-axial or intra-axial although the former is favored (potentially with brain invasion) given sclerosis involving the overlying skull. There is moderately extensive vasogenic edema in the right occipital lobe and posterior temporal lobe with regional sulcal effacement and mass effect on the right lateral ventricle. There is no midline shift. No acute cortically based infarct or extra-axial fluid collection is identified. Cerebral atrophy is mildly age advanced. Hypodensities in the cerebral white matter bilaterally are nonspecific but compatible with mild chronic small vessel ischemic disease. Vascular: Calcified atherosclerosis at the skull base. No  hyperdense vessel. Skull: Multiple sclerotic skull lesions consistent with metastases including a large area of right parieto-occipital skull involvement. Sinuses/Orbits: Opacification of a single posterior left ethmoid air cell. Unremarkable orbits. Other: Small right frontal scalp lipoma. IMPRESSION: 1. 3.2 cm right occipital mass with moderately extensive vasogenic edema consistent with metastatic disease. Regional mass effect without midline shift. 2. Multiple skull metastases. Electronically Signed   By: Logan Bores M.D.   On: 03/30/2019 16:29   Ct Soft Tissue Neck W Contrast  Result Date: 03/30/2019 CLINICAL DATA:  Neck mass, afebrile. History of prostate cancer with metastatic disease. EXAM: CT NECK WITH CONTRAST TECHNIQUE: Multidetector CT imaging of the neck was performed using the standard protocol following the bolus administration of intravenous contrast. CONTRAST:  136mL OMNIPAQUE IOHEXOL 300 MG/ML  SOLN COMPARISON:  CT head 12/29/2017 FINDINGS: Pharynx and larynx: Normal. No mass or swelling. Salivary glands: No inflammation, mass, or stone. Thyroid: Negative Lymph nodes: Numerous lymph nodes in the neck, left greater than right. Largest lymph node is left supraclavicular, level 4 node measuring 28 x 34 mm. Additional multiple subcentimeter nodes left lower neck. Necrotic lymph  node on the right measures 15 mm posteriorly, level 5 in the mid neck. Additional small level 5 nodes bilaterally Vascular: Normal vascular enhancement.  Mild atherosclerotic disease Limited intracranial: Enhancing mass lesion in the right occipital parietal region. This is a peripheral mass lesion with extensive vasogenic edema in the right occipital lobe. There is associated sclerotic bony metastasis in the overlying right parietal bone. Visualized orbits: Negative Mastoids and visualized paranasal sinuses: Mild mucosal edema paranasal sinuses. Negative orbit Skeleton: Sclerotic lesions in the cervical spine and sternum  compatible with metastatic disease. Sclerotic lesion right parietal bone compatible with metastatic disease. Upper chest: Lung apices clear bilaterally appear Other: None IMPRESSION: Cervical lymphadenopathy left greater than right compatible with metastatic disease. Largest left supraclavicular node measures 28 x 34 mm. Skeletal metastatic disease cervical spine, sternum, right parietal bone Enhancing mass lesion right occipital parietal lobe. Peripheral mass lesion is likely associated with bony metastatic disease extending into the dura. There is considerable white matter edema. No brain mass identified on the prior CT head 12/29/2017. Electronically Signed   By: Franchot Gallo M.D.   On: 03/30/2019 16:16   Ct Chest W Contrast  Result Date: 03/30/2019 CLINICAL DATA:  Left upper quadrant pain since last night. History of metastatic prostate cancer. EXAM: CT CHEST, ABDOMEN, AND PELVIS WITH CONTRAST TECHNIQUE: Multidetector CT imaging of the chest, abdomen and pelvis was performed following the standard protocol during bolus administration of intravenous contrast. CONTRAST:  116mL OMNIPAQUE IOHEXOL 300 MG/ML  SOLN COMPARISON:  CT chest, abdomen, and pelvis dated Dec 22, 2018. FINDINGS: CT CHEST FINDINGS Cardiovascular: Normal heart size. No pericardial effusion. No thoracic aortic aneurysm or dissection. Coronary, aortic arch, and branch vessel atherosclerotic vascular disease. No central pulmonary embolism. Mediastinum/Nodes: Enlarging left supraclavicular lymph nodes measuring up to 1.8 cm, previously 10 mm. New mildly enlarged right paratracheal lymph node measuring 1.2 cm in short axis. Unchanged enlarged retrocrural lymph nodes measuring up to 15 mm. No enlarged axillary or hilar lymph nodes. The thyroid gland, trachea, and esophagus are unremarkable. Lungs/Pleura: Subsegmental atelectasis in both lower lobes. No focal consolidation, pleural effusion, or pneumothorax. Unchanged 10 mm ground-glass nodule in  the left lower lobe (series 9, image 111). Musculoskeletal: Bilateral gynecomastia. Sclerotic lesions noted in the sternum, ribs, left scapula, and thoracic spine are not significantly changed. Old bilateral rib fractures again noted. CT ABDOMEN PELVIS FINDINGS Hepatobiliary: No focal liver abnormality is seen. No gallstones, gallbladder wall thickening, or biliary dilatation. Pancreas: Unremarkable. No pancreatic ductal dilatation or surrounding inflammatory changes. Spleen: Normal in size without focal abnormality. Adrenals/Urinary Tract: Adrenal glands are unremarkable. Kidneys are normal, without renal calculi, focal lesion, or hydronephrosis. Bladder is decompressed. Stomach/Bowel: Stomach is within normal limits. Appendix is normal. No evidence of bowel wall thickening, distention, or inflammatory changes. Mild left-sided colonic diverticulosis again noted. Vascular/Lymphatic: Bulky lymphadenopathy in the upper abdomen and retroperitoneum has increased in size. For example, a hepatoduodenal ligament lymph node now measures 4.3 cm in short axis, previously 2.5 cm. Right inguinal lymph nodes have increased in size, now measuring up to 2.5 cm, previously 1.6 cm. The left external iliac lymph node now measures 1.6 cm in short axis, previously 0.7 cm. Aortic atherosclerosis. Reproductive: Brachytherapy seeds again noted in the prostate gland. Other: Increased mild presacral stranding. Unchanged small bilateral fat containing inguinal hernias. No free fluid or pneumoperitoneum. Musculoskeletal: Sclerotic lesions in the lumbar spine and both iliac bones are grossly unchanged. IMPRESSION: 1.  No acute intrathoracic or intra-abdominal process. 2. Progressive  left supraclavicular, upper abdominal, retroperitoneal, and right inguinal nodal metastatic disease. 3. Grossly unchanged osseous metastases. 4. Unchanged 10 mm ground-glass nodule in the left lower lobe. Initial follow-up with CT in 9 months is recommended to  confirm persistence. If persistent, repeat CT is recommended every 2 years until 5 years of stability has been established. This recommendation follows the consensus statement: Guidelines for Management of Incidental Pulmonary Nodules Detected on CT Images: From the Fleischner Society 2017; Radiology 2017; 284:228-243. 5.  Aortic atherosclerosis (ICD10-I70.0). Electronically Signed   By: Titus Dubin M.D.   On: 03/30/2019 16:25   Ct Abdomen Pelvis W Contrast  Result Date: 03/30/2019 CLINICAL DATA:  Left upper quadrant pain since last night. History of metastatic prostate cancer. EXAM: CT CHEST, ABDOMEN, AND PELVIS WITH CONTRAST TECHNIQUE: Multidetector CT imaging of the chest, abdomen and pelvis was performed following the standard protocol during bolus administration of intravenous contrast. CONTRAST:  147mL OMNIPAQUE IOHEXOL 300 MG/ML  SOLN COMPARISON:  CT chest, abdomen, and pelvis dated Dec 22, 2018. FINDINGS: CT CHEST FINDINGS Cardiovascular: Normal heart size. No pericardial effusion. No thoracic aortic aneurysm or dissection. Coronary, aortic arch, and branch vessel atherosclerotic vascular disease. No central pulmonary embolism. Mediastinum/Nodes: Enlarging left supraclavicular lymph nodes measuring up to 1.8 cm, previously 10 mm. New mildly enlarged right paratracheal lymph node measuring 1.2 cm in short axis. Unchanged enlarged retrocrural lymph nodes measuring up to 15 mm. No enlarged axillary or hilar lymph nodes. The thyroid gland, trachea, and esophagus are unremarkable. Lungs/Pleura: Subsegmental atelectasis in both lower lobes. No focal consolidation, pleural effusion, or pneumothorax. Unchanged 10 mm ground-glass nodule in the left lower lobe (series 9, image 111). Musculoskeletal: Bilateral gynecomastia. Sclerotic lesions noted in the sternum, ribs, left scapula, and thoracic spine are not significantly changed. Old bilateral rib fractures again noted. CT ABDOMEN PELVIS FINDINGS  Hepatobiliary: No focal liver abnormality is seen. No gallstones, gallbladder wall thickening, or biliary dilatation. Pancreas: Unremarkable. No pancreatic ductal dilatation or surrounding inflammatory changes. Spleen: Normal in size without focal abnormality. Adrenals/Urinary Tract: Adrenal glands are unremarkable. Kidneys are normal, without renal calculi, focal lesion, or hydronephrosis. Bladder is decompressed. Stomach/Bowel: Stomach is within normal limits. Appendix is normal. No evidence of bowel wall thickening, distention, or inflammatory changes. Mild left-sided colonic diverticulosis again noted. Vascular/Lymphatic: Bulky lymphadenopathy in the upper abdomen and retroperitoneum has increased in size. For example, a hepatoduodenal ligament lymph node now measures 4.3 cm in short axis, previously 2.5 cm. Right inguinal lymph nodes have increased in size, now measuring up to 2.5 cm, previously 1.6 cm. The left external iliac lymph node now measures 1.6 cm in short axis, previously 0.7 cm. Aortic atherosclerosis. Reproductive: Brachytherapy seeds again noted in the prostate gland. Other: Increased mild presacral stranding. Unchanged small bilateral fat containing inguinal hernias. No free fluid or pneumoperitoneum. Musculoskeletal: Sclerotic lesions in the lumbar spine and both iliac bones are grossly unchanged. IMPRESSION: 1.  No acute intrathoracic or intra-abdominal process. 2. Progressive left supraclavicular, upper abdominal, retroperitoneal, and right inguinal nodal metastatic disease. 3. Grossly unchanged osseous metastases. 4. Unchanged 10 mm ground-glass nodule in the left lower lobe. Initial follow-up with CT in 9 months is recommended to confirm persistence. If persistent, repeat CT is recommended every 2 years until 5 years of stability has been established. This recommendation follows the consensus statement: Guidelines for Management of Incidental Pulmonary Nodules Detected on CT Images: From the  Fleischner Society 2017; Radiology 2017; 284:228-243. 5.  Aortic atherosclerosis (ICD10-I70.0). Electronically Signed  By: Titus Dubin M.D.   On: 03/30/2019 16:25    Microbiology: Recent Results (from the past 240 hour(s))  SARS CORONAVIRUS 2 (TAT 6-12 HRS) Nasal Swab Aptima Multi Swab     Status: None   Collection Time: 03/30/19  4:53 PM   Specimen: Aptima Multi Swab; Nasal Swab  Result Value Ref Range Status   SARS Coronavirus 2 NEGATIVE NEGATIVE Final    Comment: (NOTE) SARS-CoV-2 target nucleic acids are NOT DETECTED. The SARS-CoV-2 RNA is generally detectable in upper and lower respiratory specimens during the acute phase of infection. Negative results do not preclude SARS-CoV-2 infection, do not rule out co-infections with other pathogens, and should not be used as the sole basis for treatment or other patient management decisions. Negative results must be combined with clinical observations, patient history, and epidemiological information. The expected result is Negative. Fact Sheet for Patients: SugarRoll.be Fact Sheet for Healthcare Providers: https://www.woods-mathews.com/ This test is not yet approved or cleared by the [place] FDA and  has been authorized for detection and/or diagnosis of SARS-CoV-2 by FDA under an Emergency Use Authorization (EUA). This EUA will remain  in effect (meaning this test can be used) for the duration of the COVID-19 declaration under Section 56 4(b)(1) of the Act, 21 U.S.C. section 360bbb-3(b)(1), unless the authorization is terminated or revoked sooner. Performed at [facility], [place] [address], [place], [place] [address]   SARS CORONAVIRUS 2 (TAT 6-24 HRS) Nasopharyngeal Nasopharyngeal Swab     Status: None   Collection Time: 04/05/19  3:10 PM   Specimen: Nasopharyngeal Swab  Result Value Ref Range Status   SARS Coronavirus 2 NEGATIVE NEGATIVE Final    Comment:  (NOTE) SARS-CoV-2 target nucleic acids are NOT DETECTED. The SARS-CoV-2 RNA is generally detectable in upper and lower respiratory specimens during the acute phase of infection. Negative results do not preclude SARS-CoV-2 infection, do not rule out co-infections with other pathogens, and should not be used as the sole basis for treatment or other patient management decisions. Negative results must be combined with clinical observations, patient history, and epidemiological information. The expected result is Negative. Fact Sheet for Patients: SugarRoll.be Fact Sheet for Healthcare Providers: https://www.woods-mathews.com/ This test is not yet approved or cleared by the [place] FDA and  has been authorized for detection and/or diagnosis of SARS-CoV-2 by FDA under an Emergency Use Authorization (EUA). This EUA will remain  in effect (meaning this test can be used) for the duration of the COVID-19 declaration under Section 56 4(b)(1) of the Act, 21 U.S.C. section 360bbb-3(b)(1), unless the authorization is terminated or revoked sooner. Performed at [facility], [place] [address], [place], [place] [address]      Labs: Basic Metabolic Panel: Recent Labs  Lab 03/30/19 1223 03/31/19 0515 04/02/19 0553 04/05/19 1226 04/06/19 0405  NA 135 135 136 133* 136  K 3.6 3.9 3.9 4.0 4.1  CL 99 100 98 96* 102  CO2 25 24 27 24 26   GLUCOSE 200* 179* 157* 140* 129*  BUN 11 10 20  30* 27*  CREATININE 0.79 0.64 0.67 0.93 0.72  CALCIUM 8.7* 9.2 9.4 8.4* 8.0*   Liver Function Tests: Recent Labs  Lab 03/30/19 1223 03/31/19 0515 04/05/19 1226  AST 18 13* 33  ALT 10 8 31   ALKPHOS 89 82 86  BILITOT 0.6 0.5 0.7  PROT 7.3 7.2 7.5  ALBUMIN 3.6 3.3* 3.7   Recent Labs  Lab 03/30/19 1223  LIPASE 20   No  results for input(s): AMMONIA in the last 168 hours. CBC: Recent Labs  Lab 03/30/19 1223 03/31/19 0515 04/02/19 0553 04/05/19 1226  04/06/19 0405  WBC 9.3 8.3 11.0* 11.6* 11.2*  NEUTROABS  --   --  9.8* 9.8*  --   HGB 10.2* 9.5* 9.9* 11.5* 10.3*  HCT 32.5* 30.3* 31.3* 36.8* 32.6*  MCV 93.7 94.7 93.4 94.1 94.5  PLT 274 284 355 407* 354   Cardiac Enzymes: No results for input(s): CKTOTAL, CKMB, CKMBINDEX, TROPONINI in the last 168 hours. BNP: BNP (last 3 results) No results for input(s): BNP in the last 8760 hours.  ProBNP (last 3 results) No results for input(s): PROBNP in the last 8760 hours.  CBG: No results for input(s): GLUCAP in the last 168 hours.     Signed:  Irine Seal MD.  [facility] 04/06/2019, 11:20 AM

## 2019-04-06 NOTE — Plan of Care (Signed)
Discharge instructions reviewed with patient, questions answered, verbalized understanding.  Went over medication list in detail including what every medication is for, when his last dose was and when the next dose was due. Patient verbalized understanding of medication list.  Patient transported via wheelchair to main entrance to be picked up by his brother.

## 2019-04-06 NOTE — Progress Notes (Signed)
Patient very concerned about his medications, says he does not have Dilaudid at home (although it is listed on his med rec).  Patient says he has been taking short acting  Morphine and longacting morphine at home.    Also concerned about his BP meds, I went over his previous discharge instructions which state he is only on Bystolic at home.  Patient unsure about what he is taking.

## 2019-04-06 NOTE — Progress Notes (Signed)
Patient ID: Alejandro [name]., male   DOB: 19-Sep-1955, 63 y.o.   MRN: [id number]  This NP visited patient at the bedside as a follow up to  yesterday's GOCs meeting in the outpatient radiation oncology clinic.  Continued conversation regarding diagnosis and prognosis, goals of care, end-of-life wishes, disposition and options.  Patient is very clear that he wants to finish his radiation treatment and after completed ( 03-13-19) he does not desire any further anticancer treatment therapies.  His focus is comfort, quality and dignity.    Plan of Care   -DNR/DNI -No artificial feeding now or in the future -Complete radiation as an outpatient -Home hospice services - patient wants to discharge today "ASAP"    Explained the process of having hospice liaison come and speak to him here in the hospital but the patient was adamant about leaving as soon as possible.  I verbalized to him my concern that with out a detailed plan in place I worried the logistics would not be a smooth transition.  I specifically spoke to the patient about pain management and securing the medications that he needs to manage his pain appropriately  (this was a problem for him on his hospital discharge only a few days ago)   Patient tells me "I am fine with pain medicine I have enough for at least a week"   I spoke with the attending and the patient will be discharged as soon as paperwork is in order.  I spoke with the patient's brother this morning and he supports his brother's decisions for above plan of care and has already been working on securing out-of-pocket caregivers in the home. Encourage patient and family to call with questions or concerns into the future  Questions and concerns addressed   Discussed with Dr Grandville Silos   Total time spent on the unit was 45 minutes  Greater than 50% of the time was spent in counseling and coordination of care  Wadie Lessen NP  Palliative Medicine Team Team Phone # [phone] Pager [phone]

## 2019-04-07 ENCOUNTER — Other Ambulatory Visit: Payer: Self-pay

## 2019-04-07 ENCOUNTER — Ambulatory Visit
Admission: RE | Admit: 2019-04-07 | Discharge: 2019-04-07 | Disposition: A | Discharge status: Home / Self Care | Payer: BC Managed Care – PPO | Source: Ambulatory Visit | Attending: Radiation Oncology | Admitting: Radiation Oncology

## 2019-04-07 DIAGNOSIS — C778 Secondary and unspecified malignant neoplasm of lymph nodes of multiple regions: Secondary | ICD-10-CM | POA: Diagnosis not present

## 2019-04-07 DIAGNOSIS — E860 Dehydration: Secondary | ICD-10-CM

## 2019-04-08 ENCOUNTER — Ambulatory Visit
Admission: RE | Admit: 2019-04-08 | Discharge: 2019-04-08 | Disposition: A | Discharge status: Home / Self Care | Payer: BC Managed Care – PPO | Source: Ambulatory Visit | Attending: Radiation Oncology | Admitting: Radiation Oncology

## 2019-04-08 ENCOUNTER — Other Ambulatory Visit: Payer: Self-pay

## 2019-04-08 DIAGNOSIS — C778 Secondary and unspecified malignant neoplasm of lymph nodes of multiple regions: Secondary | ICD-10-CM | POA: Diagnosis not present

## 2019-04-12 ENCOUNTER — Ambulatory Visit
Admission: RE | Admit: 2019-04-12 | Discharge: 2019-04-12 | Disposition: A | Discharge status: Home / Self Care | Payer: BC Managed Care – PPO | Source: Ambulatory Visit | Attending: Radiation Oncology | Admitting: Radiation Oncology

## 2019-04-12 ENCOUNTER — Other Ambulatory Visit: Payer: Self-pay

## 2019-04-12 DIAGNOSIS — C778 Secondary and unspecified malignant neoplasm of lymph nodes of multiple regions: Secondary | ICD-10-CM | POA: Diagnosis not present

## 2019-04-13 ENCOUNTER — Ambulatory Visit
Admission: RE | Admit: 2019-04-13 | Discharge: 2019-04-13 | Disposition: A | Discharge status: Home / Self Care | Payer: BC Managed Care – PPO | Source: Ambulatory Visit | Attending: Radiation Oncology | Admitting: Radiation Oncology

## 2019-04-13 ENCOUNTER — Other Ambulatory Visit: Payer: Self-pay

## 2019-04-13 DIAGNOSIS — C778 Secondary and unspecified malignant neoplasm of lymph nodes of multiple regions: Secondary | ICD-10-CM | POA: Diagnosis not present

## 2019-04-14 ENCOUNTER — Ambulatory Visit
Admission: RE | Admit: 2019-04-14 | Discharge: 2019-04-14 | Disposition: A | Discharge status: Home / Self Care | Payer: BC Managed Care – PPO | Source: Ambulatory Visit | Attending: Radiation Oncology | Admitting: Radiation Oncology

## 2019-04-14 ENCOUNTER — Telehealth: Payer: Self-pay | Admitting: Hospice

## 2019-04-14 ENCOUNTER — Encounter: Payer: Self-pay | Admitting: Radiation Oncology

## 2019-04-14 ENCOUNTER — Other Ambulatory Visit: Payer: Self-pay

## 2019-04-14 DIAGNOSIS — C778 Secondary and unspecified malignant neoplasm of lymph nodes of multiple regions: Secondary | ICD-10-CM | POA: Diagnosis not present

## 2019-04-14 NOTE — Telephone Encounter (Signed)
Rec'd call back from patient and after discussing Palliative services he has declined this at this point and time.  I explained to him that if decided to pursue Palliative services later on to please contact his PCP and let them know.  Patient was in agreement with this.  I will cancel the Palliative referral.

## 2019-04-14 NOTE — Telephone Encounter (Signed)
Called patient to see if he wanted to pursue Palliative services, since he had previously declined Hospice services, no answer.  Left message with reason for call along with my contact information.

## 2019-04-15 ENCOUNTER — Telehealth: Payer: Self-pay

## 2019-04-15 NOTE — Telephone Encounter (Signed)
Contacted patient and left a message that the scans scheduled for 9/14 and 9/15 are going to be cancelled because they are not needed since he just had all the scans performed on 8/27. Scans cancelled.

## 2019-04-15 NOTE — Telephone Encounter (Signed)
-----   Message from Wyatt Portela, MD sent at 04/15/2019  9:29 AM EDT ----- Regarding: RE: scans No. It can be canceled. Thanks.  ----- Message ----- From: Scot Dock, RN Sent: 04/15/2019   9:17 AM EDT To: Wyatt Portela, MD Subject: scans                                          He had CT scans 8/26 - chest,abd/pel,head,neck. He has MRI head and CT abd/pel scheduled for 9/14. Are these scans still needed?

## 2019-04-17 NOTE — Progress Notes (Signed)
  Radiation Oncology         [phone]) [phone] ________________________________  Name: Alejandro [name]. MRN: [id number]  Date: 04/14/2019  DOB: 12/18/1955  End of Treatment Note  Diagnosis:   63 yo man with castration resistant stage IV prostate cancer and symptomatic bilateral jugular adenopathy    Indication for treatment:  Palliation       Radiation treatment dates:   8/27-9/10/20  Site/dose:   Bilateral neck nodes treated to 30 Gy in 10 fractions of 3 Gy  Beams/energy:   3D fields with two anterior obliques and PA were employed  Narrative: The patient tolerated radiation treatment relatively well.   He was hospitalized during radiation with some increased cerebral edema s/p previous cranial radiotherapy from [facility].  He recovered with dexamethasone  Plan: The patient has completed radiation treatment. The patient will be contacted for routine followup in one month. I advised him to call or return sooner if he has any questions or concerns related to his recovery or treatment.  At this point, the plan is for the patient to enroll in hospice care.  ________________________________  Sheral Apley. Tammi Klippel, M.D.

## 2019-04-18 ENCOUNTER — Ambulatory Visit: Payer: 59 | Admitting: Psychology

## 2019-04-18 ENCOUNTER — Other Ambulatory Visit: Payer: Self-pay

## 2019-04-18 ENCOUNTER — Telehealth: Payer: Self-pay

## 2019-04-18 ENCOUNTER — Encounter: Payer: Self-pay | Admitting: Cardiology

## 2019-04-18 ENCOUNTER — Ambulatory Visit (HOSPITAL_COMMUNITY): Admission: RE | Admit: 2019-04-18 | Payer: BC Managed Care – PPO | Source: Ambulatory Visit

## 2019-04-18 ENCOUNTER — Ambulatory Visit (INDEPENDENT_AMBULATORY_CARE_PROVIDER_SITE_OTHER): Payer: BC Managed Care – PPO | Admitting: Cardiology

## 2019-04-18 VITALS — BP 154/92 | HR 54 | Ht 70.0 in | Wt 226.0 lb

## 2019-04-18 DIAGNOSIS — I10 Essential (primary) hypertension: Secondary | ICD-10-CM | POA: Diagnosis not present

## 2019-04-18 DIAGNOSIS — I480 Paroxysmal atrial fibrillation: Secondary | ICD-10-CM

## 2019-04-18 DIAGNOSIS — C7951 Secondary malignant neoplasm of bone: Secondary | ICD-10-CM | POA: Diagnosis not present

## 2019-04-18 DIAGNOSIS — C61 Malignant neoplasm of prostate: Secondary | ICD-10-CM | POA: Diagnosis not present

## 2019-04-18 MED ORDER — NEBIVOLOL HCL 5 MG PO TABS: 5.0000 mg | ORAL_TABLET | Freq: Every day | ORAL | 0 refills | Status: DC

## 2019-04-18 NOTE — Telephone Encounter (Signed)
Spoke to patient and let him know that per Dr. Alen Blew he can take Decadron 4 mg once a day for a week, then 2 mg for a week then stop. Patient verbalized understanding. Also made patient aware that Dr. Alen Blew will address his scans at the next visit on 9/29. Patient verbalized understanding.

## 2019-04-18 NOTE — Telephone Encounter (Signed)
-----   Message from Wyatt Portela, MD sent at 04/18/2019 10:22 AM EDT ----- He has follow up in 9/29 to MD and injection. He can take decadron 4 mg once a day for a week, 2 mg for a week then stop. I will address his scans next visit. Thanks.  ----- Message ----- From: Tami Lin, RN Sent: 04/18/2019   9:49 AM EDT To: Wyatt Portela, MD  Patient wants to know if you reviewed scans from 8/26 because he is concerned about progressive right inguinal node metastatic disease. He also is taking Decadron 4 mg every 12 hours. He wants to know if he can slowly come of this. He also wants to know if he needs a Lurpon shot this month. Last shot was in May. Lanelle Bal

## 2019-04-18 NOTE — Progress Notes (Signed)
Primary Physician/Referring:  [name], MD  Patient ID: Alejandro Ready., male    DOB: 1956-01-19, 63 y.o.   MRN: [id number]  Chief Complaint  Patient presents with  . Atrial Fibrillation    HPI: Alejandro [name].  is a 63 y.o. male  with  coronary atherosclerosis, coronary angiography in 10/13/2007 had revealed occluded RCA. He had mild disease diffusely in the other vessels, dyslipidemia, hypertension, OSA on CPAP and moderate obesity as well as an abdominal aortic atherosclerosis with severe calcification. He also has chronic orthostatic hypotension and supine hypertension. He has now developed Paroxysmal extremely symptomatic atrial fibrillation with RVR and felt to be not a candidate for anticoagulation.  Patient with fairly aggressive prostate cancer, now felt to be treated for palliative purposes only. Due to metastasis, patient has decided to forego further treatment with chemotherapy and radiation. He states that he just wants to enjoy the life he has left. He is currently on steroids, but will soon be tapering off.   He was recently admitted to the hospital on 04/05/19 with generalized weakness and dizziness, felt to be secondary to dehydration. After IV fluid resuscitation, symptoms significantly improved. He is now feeling much better since being home from the hospital. States that he has not had any episodes of breakthrough A fib. He was given diltiazem and amiodarone at his last appt; however, he did not want to start them. Dizziness continues to be resolved. He has noticed that his blood pressure has been more elevated recently.   Is unable to tolerate metoprolol as it causes him to have marked fatigue.  He is been taking metoprolol on a p.r.n. basis when he goes into atrial fibrillation.   Past Medical History:  Diagnosis Date  . ANXIETY   . AORTIC STENOSIS, MILD   . CHEST PAIN-UNSPECIFIED   . DYSPNEA   . HYPERCHOLESTEROLEMIA   . Hypertension   .  INSOMNIA   . OSA on CPAP 05/31/2013  . OSTEOARTHRITIS, KNEE   . Prostate cancer [facility])     Past Surgical History:  Procedure Laterality Date  . COLONOSCOPY    . ELBOW SURGERY    . LUMBAR LAMINECTOMY  Late 1990's  . NEUROPLASTY / TRANSPOSITION MEDIAN NERVE AT CARPAL TUNNEL BILATERAL    . PROSTATE BIOPSY      Social History   Socioeconomic History  . Marital status: Divorced    Spouse name: Not on file  . Number of children: 1  . Years of education: college  . Highest education level: Not on file  Occupational History  . Occupation: CONSULTANT    Employer: [employer]    Comment: No longer working  Social Needs  . Financial resource strain: Not on file  . Food insecurity    Worry: Not on file    Inability: Not on file  . Transportation needs    Medical: Not on file    Non-medical: Not on file  Tobacco Use  . Smoking status: Former Smoker    Packs/day: 3.00    Years: 25.00    Pack years: 75.00    Types: Cigars, Cigarettes    Quit date: 12/03/1998    Years since quitting: 20.3  . Smokeless tobacco: Never Used  . Tobacco comment: Former 3 ppd smoker for 30 years quit heavy smoking 15 years ago  Substance and Sexual Activity  . Alcohol use: Yes    Alcohol/week: 12.0 standard drinks    Types: 12 Standard drinks or equivalent per week  Comment: 2 drinks per day  . Drug use: Not Currently    Types: Cocaine    Comment: in his 63s  . Sexual activity: Yes  Lifestyle  . Physical activity    Days per week: Not on file    Minutes per session: Not on file  . Stress: Not on file  Relationships  . Social Herbalist on phone: Not on file    Gets together: Not on file    Attends religious service: Not on file    Active member of club or organization: Not on file    Attends meetings of clubs or organizations: Not on file    Relationship status: Not on file  . Intimate partner violence    Fear of current or ex partner: Not on file    Emotionally abused:  Not on file    Physically abused: Not on file    Forced sexual activity: Not on file  Other Topics Concern  . Not on file  Social History Narrative  . Not on file    Current Outpatient Medications on File Prior to Visit  Medication Sig Dispense Refill  . acetaminophen (TYLENOL) 325 MG tablet Take 650 mg by mouth every 6 (six) hours as needed for mild pain or fever.    Marland Kitchen amitriptyline (ELAVIL) 10 MG tablet Take 20 mg by mouth at bedtime.    Marland Kitchen dexamethasone (DECADRON) 4 MG tablet Take 1 tablet (4 mg total) by mouth every 12 (twelve) hours. 60 tablet 0  . HYDROmorphone (DILAUDID) 4 MG tablet Take 1 to 2 every 3 hrs as needed. 90 tablet 0  . ibuprofen (ADVIL) 200 MG tablet Take 1,200 mg by mouth every 6 (six) hours as needed for fever, headache or moderate pain.    Marland Kitchen morphine (MS CONTIN) 15 MG 12 hr tablet Take 1 tablet (15 mg total) by mouth every 12 (twelve) hours. 60 tablet 0  . polyvinyl alcohol (LIQUIFILM TEARS) 1.4 % ophthalmic solution Place 1 drop into both eyes as needed for dry eyes.    . tamsulosin (FLOMAX) 0.4 MG CAPS capsule TAKE 1 CAPSULE BY MOUTH TWICE DAILY 60 capsule 0  . chlorproMAZINE (THORAZINE) 10 MG tablet Take 1 tablet (10 mg total) by mouth 2 (two) times daily as needed for hiccoughs. (Patient not taking: Reported on 04/18/2019) 20 tablet 0  . feeding supplement, ENSURE ENLIVE, (ENSURE ENLIVE) LIQD Take 237 mLs by mouth 2 (two) times daily between meals. 237 mL 12  . meclizine (ANTIVERT) 25 MG tablet Take 1 tablet (25 mg total) by mouth 3 (three) times daily as needed for dizziness (vertigo). (Patient not taking: Reported on 04/18/2019) 20 tablet 0  . prochlorperazine (COMPAZINE) 10 MG tablet Take 1 tablet (10 mg total) by mouth every 6 (six) hours as needed for nausea or vomiting. (Patient not taking: Reported on 04/18/2019) 30 tablet 0  . senna-docusate (SENOKOT-S) 8.6-50 MG tablet Take 1 tablet by mouth 2 (two) times daily. (Patient not taking: Reported on 04/18/2019)      No current facility-administered medications on file prior to visit.    Review of Systems  Constitution: Positive for malaise/fatigue and weight loss (stable and intentional). Negative for chills, decreased appetite and weight gain.  Cardiovascular: Positive for dyspnea on exertion (stable). Negative for leg swelling and syncope.  Endocrine: Negative for cold intolerance.  Hematologic/Lymphatic: Does not bruise/bleed easily.  Musculoskeletal: Negative for joint swelling.  Gastrointestinal: Negative for abdominal pain, anorexia and change in bowel habit.  Genitourinary: Positive for decreased libido and frequency.  Neurological: Negative for dizziness, headaches and light-headedness.  Psychiatric/Behavioral: Positive for depression. Negative for substance abuse. The patient is nervous/anxious.   All other systems reviewed and are negative.     Objective  Blood pressure (!) 154/92, pulse (!) 54, height 5\' 10"  (1.778 m), weight 226 lb (102.5 kg), SpO2 98 %. Body mass index is 32.43 kg/m.     Physical Exam  Constitutional: He appears well-developed. No distress.  Mildly obese  HENT:  Head: Atraumatic.  Eyes: Conjunctivae are normal.  Neck: Neck supple. No JVD present. No thyromegaly present.  Cardiovascular: Regular rhythm, normal heart sounds, intact distal pulses and normal pulses. Exam reveals no gallop, no S3 and no S4.  No murmur heard. No edema  Pulmonary/Chest: Effort normal and breath sounds normal.  Abdominal: Soft. Bowel sounds are normal.  Musculoskeletal: Normal range of motion.  Neurological: He is alert.  Skin: Skin is warm and dry.  Psychiatric: He has a normal mood and affect.   Radiology: No results found.  Laboratory examination:   CMP Latest Ref Rng & Units 04/06/2019 04/05/2019 04/02/2019  Glucose 70 - 99 mg/dL 129(H) 140(H) 157(H)  BUN 8 - 23 mg/dL 27(H) 30(H) 20  Creatinine 0.61 - 1.24 mg/dL 0.72 0.93 0.67  Sodium 135 - 145 mmol/L 136 133(L) 136  Potassium  3.5 - 5.1 mmol/L 4.1 4.0 3.9  Chloride 98 - 111 mmol/L 102 96(L) 98  CO2 22 - 32 mmol/L 26 24 27   Calcium 8.9 - 10.3 mg/dL 8.0(L) 8.4(L) 9.4  Total Protein 6.5 - 8.1 g/dL - 7.5 -  Total Bilirubin 0.3 - 1.2 mg/dL - 0.7 -  Alkaline Phos 38 - 126 U/L - 86 -  AST 15 - 41 U/L - 33 -  ALT 0 - 44 U/L - 31 -   CBC Latest Ref Rng & Units 04/06/2019 04/05/2019 04/02/2019  WBC 4.0 - 10.5 K/uL 11.2(H) 11.6(H) 11.0(H)  Hemoglobin 13.0 - 17.0 g/dL 10.3(L) 11.5(L) 9.9(L)  Hematocrit 39.0 - 52.0 % 32.6(L) 36.8(L) 31.3(L)  Platelets 150 - 400 K/uL 354 407(H) 355   Lipid Panel  No results found for: CHOL, TRIG, HDL, CHOLHDL, VLDL, LDLCALC, LDLDIRECT HEMOGLOBIN A1C No results found for: HGBA1C, MPG TSH Recent Labs    01/13/19 1955  TSH 0.550    Cardiac Studies:   Coronary angiogram 10/13/2007: Mild diffuse CAD involving LAD and circumflex coronary artery.  RCA occluded in the proximal segment, with left-to-right and right-to-left right region collaterals.  Ejection fraction 55% with mild inferobasal hypokinesis.  Mild aortic stenosis.  Lexiscan sestamibi stress test 03/17/2014: 1. Resting EKG demonstrated normal sinus rhythm. Initially patient exercised for 8 minutes and 35 seconds on a Bruce protocol and achieved 13.4 METs. However patient achieved only 73% of age-predicted maximum heart rate, hence stress test had to be terminated and switched over to [place] sestamibi stress test. There is no ST-T wave changes for ischemia with exercise stress test, Lexiscan stress EKG revealed no ST-T wave changes ischemia. 2. The quality of the stress images was good. There is minimal inferior wall diaphragmatic attenuation artifact without evidence of ischemia. Left ventricle systolic function Related by QGS was 64%. The left ventricle was normal in size both in rest and stress images. This is a low risk study.  Echocardiogram [12/03/2016]: Left ventricle cavity is normal in size. Mild concentric hypertrophy of the  left ventricle. Normal global wall motion. Normal diastolic filling pattern. Calculated EF 66%. Left atrial cavity  is moderately dilated at 4.7 cm. Mild aortic valve leaflet thickening with mild calcification. No evidence of aortic valve stenosis. Mild to moderate aortic regurgitation. Mild (Grade I) mitral regurgitation. Mild tricuspid regurgitation. Mild pulmonary hypertension. Pulmonary artery systolic pressure is estimated at 34 mm Hg. The aortic root is mildly dilated at 4.0 cm. Compared to the study done on 03/30/2014, previously aortic regurgitation was very mild. Pulmonary hypertension is new.   Assessment     ICD-10-CM   1. Paroxysmal atrial fibrillation (HCC)  I48.0 EKG 12-Lead  2. Essential hypertension  I10   3. Prostate cancer metastatic to bone ([place])  C61    C79.51     CHA2DS2-VASc Score is 2.  Yearly risk of stroke: 2.2%.    EKG 0/12/2018: Normal sinus rhythm at rate of 70 bpm, left atrial abnormality.  Normal axis, nonspecific T abnormality.  Normal QT interval.  EKG 01/13/2019: Atrial fibrillation with rapid ventricular response at the rate of 160 bpm, LVH with repolarization abnormality, cannot exclude inferior and lateral ischemia.  EKG 04/18/2019: Sinus bradycardia at 53 bpm, normal axis, LVH. No evidence of ischemia.  Recommendations:   Patient now has advanced prostate cancer with metastatic disease, due to intracranial bleed that was noted recently after metastases to the skull, he was not started on anticoagulation due to metastatic prostate cancer.  He fortunately, has not had recurrence of A fib over the last several weeks and dizziness has improved. He is now having elevated blood pressure. He was previously on Losartan, would recommend retrying low dose of this back. Patient feels that his steroids are contributing to his hypertension and as he is planning to start weaning off, he wishes to wait to add the medication unless his blood pressure continues to be  elevated with lower dose of his steroid. He will monitor at home and notify me if continues to be elevated. Will continue with watchful waiting given that he has not had recurrence of A fib. Encouraged him to ensure he is staying well hydrated. Will plan to see him back in 6 weeks or sooner if problems.   Miquel Dunn, MSN, APRN, FNP-C [facility]. [place] Office: [phone] Fax: [phone]

## 2019-04-19 ENCOUNTER — Ambulatory Visit (HOSPITAL_COMMUNITY): Payer: BC Managed Care – PPO

## 2019-04-30 ENCOUNTER — Other Ambulatory Visit: Payer: Self-pay | Admitting: Oncology

## 2019-04-30 DIAGNOSIS — C61 Malignant neoplasm of prostate: Secondary | ICD-10-CM

## 2019-05-03 ENCOUNTER — Inpatient Hospital Stay: Payer: BC Managed Care – PPO

## 2019-05-03 ENCOUNTER — Other Ambulatory Visit: Payer: Self-pay

## 2019-05-03 ENCOUNTER — Inpatient Hospital Stay: Payer: BC Managed Care – PPO | Attending: Oncology | Admitting: Oncology

## 2019-05-03 VITALS — BP 137/74 | HR 79 | Temp 98.7°F | Resp 18 | Ht 70.0 in | Wt 234.3 lb

## 2019-05-03 DIAGNOSIS — R5383 Other fatigue: Secondary | ICD-10-CM | POA: Insufficient documentation

## 2019-05-03 DIAGNOSIS — M545 Low back pain: Secondary | ICD-10-CM | POA: Diagnosis not present

## 2019-05-03 DIAGNOSIS — Z192 Hormone resistant malignancy status: Secondary | ICD-10-CM | POA: Diagnosis not present

## 2019-05-03 DIAGNOSIS — D72819 Decreased white blood cell count, unspecified: Secondary | ICD-10-CM | POA: Diagnosis not present

## 2019-05-03 DIAGNOSIS — Z79899 Other long term (current) drug therapy: Secondary | ICD-10-CM | POA: Insufficient documentation

## 2019-05-03 DIAGNOSIS — C61 Malignant neoplasm of prostate: Secondary | ICD-10-CM | POA: Diagnosis present

## 2019-05-03 DIAGNOSIS — D696 Thrombocytopenia, unspecified: Secondary | ICD-10-CM | POA: Insufficient documentation

## 2019-05-03 DIAGNOSIS — Z9221 Personal history of antineoplastic chemotherapy: Secondary | ICD-10-CM | POA: Insufficient documentation

## 2019-05-03 DIAGNOSIS — C7951 Secondary malignant neoplasm of bone: Secondary | ICD-10-CM

## 2019-05-03 DIAGNOSIS — G893 Neoplasm related pain (acute) (chronic): Secondary | ICD-10-CM | POA: Diagnosis not present

## 2019-05-03 DIAGNOSIS — I10 Essential (primary) hypertension: Secondary | ICD-10-CM

## 2019-05-03 DIAGNOSIS — R531 Weakness: Secondary | ICD-10-CM | POA: Insufficient documentation

## 2019-05-03 LAB — CBC WITH DIFFERENTIAL (CANCER CENTER ONLY)
Abs Immature Granulocytes: 0.01 10*3/uL (ref 0.00–0.07)
Basophils Absolute: 0 10*3/uL (ref 0.0–0.1)
Basophils Relative: 0 %
Eosinophils Absolute: 0 10*3/uL (ref 0.0–0.5)
Eosinophils Relative: 1 %
HCT: 35.2 % — ABNORMAL LOW (ref 39.0–52.0)
Hemoglobin: 10.9 g/dL — ABNORMAL LOW (ref 13.0–17.0)
Immature Granulocytes: 1 %
Lymphocytes Relative: 12 %
Lymphs Abs: 0.2 10*3/uL — ABNORMAL LOW (ref 0.7–4.0)
MCH: 30.2 pg (ref 26.0–34.0)
MCHC: 31 g/dL (ref 30.0–36.0)
MCV: 97.5 fL (ref 80.0–100.0)
Monocytes Absolute: 0.2 10*3/uL (ref 0.1–1.0)
Monocytes Relative: 11 %
Neutro Abs: 1.5 10*3/uL — ABNORMAL LOW (ref 1.7–7.7)
Neutrophils Relative %: 75 %
Platelet Count: 74 10*3/uL — ABNORMAL LOW (ref 150–400)
RBC: 3.61 MIL/uL — ABNORMAL LOW (ref 4.22–5.81)
RDW: 17.4 % — ABNORMAL HIGH (ref 11.5–15.5)
WBC Count: 1.9 10*3/uL — ABNORMAL LOW (ref 4.0–10.5)
nRBC: 0 % (ref 0.0–0.2)

## 2019-05-03 LAB — CMP (CANCER CENTER ONLY)
ALT: 21 U/L (ref 0–44)
AST: 19 U/L (ref 15–41)
Albumin: 3.4 g/dL — ABNORMAL LOW (ref 3.5–5.0)
Alkaline Phosphatase: 82 U/L (ref 38–126)
Anion gap: 9 (ref 5–15)
BUN: 15 mg/dL (ref 8–23)
CO2: 28 mmol/L (ref 22–32)
Calcium: 9 mg/dL (ref 8.9–10.3)
Chloride: 101 mmol/L (ref 98–111)
Creatinine: 0.82 mg/dL (ref 0.61–1.24)
GFR, Est AFR Am: >60 mL/min (ref >60)
GFR, Estimated: >60 mL/min (ref >60)
Glucose, Bld: 99 mg/dL (ref 70–99)
Potassium: 4.5 mmol/L (ref 3.5–5.1)
Sodium: 138 mmol/L (ref 135–145)
Total Bilirubin: 0.6 mg/dL (ref 0.3–1.2)
Total Protein: 6.2 g/dL — ABNORMAL LOW (ref 6.5–8.1)

## 2019-05-03 MED ORDER — MORPHINE SULFATE ER 15 MG PO TBCR: 15.0000 mg | EXTENDED_RELEASE_TABLET | Freq: Two times a day (BID) | ORAL | 0 refills | Status: DC

## 2019-05-03 MED ORDER — HYDROMORPHONE HCL 4 MG PO TABS: ORAL_TABLET | ORAL | 0 refills | Status: DC

## 2019-05-03 MED ORDER — NEBIVOLOL HCL 5 MG PO TABS: 5.0000 mg | ORAL_TABLET | Freq: Every day | ORAL | 0 refills | Status: DC

## 2019-05-03 NOTE — Progress Notes (Signed)
Hematology and Oncology Follow Up Visit  Alejandro [name] [id number] 08/24/1955 63 y.o. 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     Prior Therapy:   Status post biopsy done on July of 2015 followed by lymph node dissection completed in 2015 at [facility].  He was treated there with definitive radiation therapy utilizing seed implant as well as external beam radiation completed in 2016.  He developed advanced disease with bone involvement.  He received radiation therapy to the pelvis in 2017.  Xtandi 160 mg daily that was poorly tolerated and was discontinued.  He was switched to [name] which she took until 2019 and developed progression of disease with bony metastasis as well as PSA of 80.  Taxotere chemotherapy at 75 mg/m cycle 1 given in March 2019.  He is status post 6 cycles of chemotherapy last cycle given on February 03, 2018.   Status post clinical trial utilizing Darolutamide as well as LuPSAM a part of a clinical trial at [facility] in [place].  He is status post 4 cycles of therapy.  Last cycle given on October 01, 2018.  Therapy discontinued as a progression of disease.    Lynparza 300 mg twice daily started in July 2020.  Therapy discontinued because of poor tolerance and patient wishes.  Current therapy:   Lupron 30 mg every 4 months.  Therapy due in September 2020.  Xgeva 120 mg every 4 weeks.  Is due to receive injections #2020.      Interim History:  Alejandro [name] returns today for a follow-up visit.  Since the last visit, he has been hospitalized on few occasions due to increased fatigue and weakness and failure to thrive.  He did receive palliative radiation therapy to supraclavicular lymph nodes which are improved his quality of life.  His mobility has been limited at this time  and currently using long-acting morphine and breakthrough Dilaudid for pain.  His performance status is limited but not declining.  His pain is predominantly in the lower back and pelvis and exacerbated by mobility.  He denied headaches, blurry vision, syncope or seizures.  Denies any fevers, chills or sweats.  Denied chest pain, palpitation, orthopnea or leg edema.  Denied cough, wheezing or hemoptysis.  Denied nausea, vomiting or abdominal pain.  Denies any constipation or diarrhea.  Denies any frequency urgency or hesitancy.  Denies any arthralgias or myalgias.  Denies any skin rashes or lesions.  Denies any bleeding or clotting tendency.  Denies any easy bruising.  Denies any hair or nail changes.  Denies any anxiety or depression.  Remaining review of system is negative.           Medications: Reviewed without changes. Current Outpatient Medications  Medication Sig Dispense Refill  . acetaminophen (TYLENOL) 325 MG tablet Take 650 mg by mouth every 6 (six) hours as needed for mild pain or fever.    Marland Kitchen amitriptyline (ELAVIL) 10 MG tablet Take 20 mg by mouth at bedtime.    . chlorproMAZINE (THORAZINE) 10 MG tablet Take 1 tablet (10 mg total) by mouth 2 (two) times daily as needed for hiccoughs. (Patient not taking: Reported on 04/18/2019) 20 tablet 0  . dexamethasone (DECADRON) 4 MG tablet Take 1 tablet (4 mg total) by mouth every 12 (twelve) hours. 60 tablet 0  . feeding supplement, ENSURE ENLIVE, (ENSURE ENLIVE) LIQD Take 237 mLs by  mouth 2 (two) times daily between meals. 237 mL 12  . HYDROmorphone (DILAUDID) 4 MG tablet Take 1 to 2 every 3 hrs as needed. 90 tablet 0  . ibuprofen (ADVIL) 200 MG tablet Take 1,200 mg by mouth every 6 (six) hours as needed for fever, headache or moderate pain.    . meclizine (ANTIVERT) 25 MG tablet Take 1 tablet (25 mg total) by mouth 3 (three) times daily as needed for dizziness (vertigo). (Patient not taking: Reported on 04/18/2019) 20 tablet 0  . morphine  (MS CONTIN) 15 MG 12 hr tablet Take 1 tablet (15 mg total) by mouth every 12 (twelve) hours. 60 tablet 0  . nebivolol (BYSTOLIC) 5 MG tablet Take 1 tablet (5 mg total) by mouth daily. 90 tablet 0  . polyvinyl alcohol (LIQUIFILM TEARS) 1.4 % ophthalmic solution Place 1 drop into both eyes as needed for dry eyes.    Marland Kitchen prochlorperazine (COMPAZINE) 10 MG tablet Take 1 tablet (10 mg total) by mouth every 6 (six) hours as needed for nausea or vomiting. (Patient not taking: Reported on 04/18/2019) 30 tablet 0  . senna-docusate (SENOKOT-S) 8.6-50 MG tablet Take 1 tablet by mouth 2 (two) times daily. (Patient not taking: Reported on 04/18/2019)    . tamsulosin (FLOMAX) 0.4 MG CAPS capsule TAKE 1 CAPSULE BY MOUTH TWICE DAILY 60 capsule 0   No current facility-administered medications for this visit.      Allergies:  Allergies  Allergen Reactions  . Codeine Nausea Only    Past Medical History, Surgical history, Social history, and Family History unchanged on review.  Physical Exam:     Blood pressure 137/74, pulse 79, temperature 98.7 F (37.1 C), temperature source Temporal, resp. rate 18, height 5\' 10"  (1.778 m), weight 234 lb 4.8 oz (106.3 kg), SpO2 97 %.    ECOG: 2    General appearance: Alert, awake without any distress. Head: Atraumatic without abnormalities Oropharynx: Without any thrush or ulcers. Eyes: No scleral icterus. Lymph nodes: No lymphadenopathy noted in the cervical, supraclavicular, or axillary nodes Heart:regular rate and rhythm, without any murmurs or gallops.   Lung: Clear to auscultation without any rhonchi, wheezes or dullness to percussion. Abdomin: Soft, nontender without any shifting dullness or ascites. Musculoskeletal: No clubbing or cyanosis. Neurological: No motor or sensory deficits. Skin: No rashes or lesions.          Lab Results: Lab Results  Component Value Date   WBC 1.9 (L) 05/03/2019   HGB 10.9 (L) 05/03/2019   HCT 35.2 (L) 05/03/2019    MCV 97.5 05/03/2019   PLT 74 (L) 05/03/2019     Chemistry      Component Value Date/Time   NA 136 04/06/2019 0405   NA 135 (L) 06/13/2014 0944   K 4.1 04/06/2019 0405   K 5.4 (H) 06/13/2014 0944   CL 102 04/06/2019 0405   CO2 26 04/06/2019 0405   CO2 25 06/13/2014 0944   BUN 27 (H) 04/06/2019 0405   BUN 18.8 06/13/2014 0944   CREATININE 0.72 04/06/2019 0405   CREATININE 1.11 03/14/2019 0837   CREATININE 1.0 06/13/2014 0944      Component Value Date/Time   CALCIUM 8.0 (L) 04/06/2019 0405   CALCIUM 10.0 06/13/2014 0944   ALKPHOS 86 04/05/2019 1226   ALKPHOS 96 06/13/2014 0944   AST 33 04/05/2019 1226   AST 14 (L) 03/14/2019 0837   AST 104 (H) 06/13/2014 0944   ALT 31 04/05/2019 1226   ALT 8 03/14/2019 0837  ALT 126 (H) 06/13/2014 0944   BILITOT 0.7 04/05/2019 1226   BILITOT 0.7 03/14/2019 0837   BILITOT 0.63 06/13/2014 0944           Results for Alejandro [name], Alejandro [name] (MRN [id number]) as of 05/03/2019 12:24  Ref. Range 02/14/2019 09:44 03/14/2019 08:37  Prostate Specific Ag, Serum Latest Ref Range: 0.0 - 4.0 ng/mL 369.0 (H) 566.0 (H)    Impression and Plan:  63 year old man with the:  1.advanced prostate cancer that is currently castration-resistant with disease to the bone and lymphadenopathy.  He is currently approaching end-stage status and has deferred any anticancer treatment.  The natural course of this disease was reviewed today and overall prognosis was reiterated.  He understands he has limited life expectancy and is comfortable with his decision to stop anticancer care.   We have discussed the role of palliative medicine as well as hospice services and he is aware of their presence of the services that they offer and will consider their services in the future.  For the time being we will continue supportive management only.   2.  Bone directed therapy: Delton See will be discontinued at this time.  3.  Androgen depravation: Risks and benefits of  resuming Lupron was discussed and at this time we opted to discontinue treatment altogether.  4.  Generalized pain: Related to skeletal metastasis as well as disease progression overall.  We will refill his medication and I instructed him to continue morphine twice a day and use breakthrough as needed Dilaudid.  5.  Leukocytopenia and thrombocytopenia: Possibly related to bone marrow involvement with his prostate cancer or potential radiation.  No intervention is needed at this time.  6.  Prognosis: He has limited life expectancy and hospice eligible and will consider that option in the future.  No additional anticancer treatments will be given.  We also discussed advanced directives as well as DO NOT RESUSCITATE wishes.  He will continue to consider this and discuss with his family.  7. Follow-up: Will return in 8 weeks for repeat evaluation.  25 minutes was spent with the patient face-to-face today.  More than 50% of time was spent on addressing his disease status, treatment options as well as answering questions regarding future plan of care.       Zola Button, MD 9/29/202012:19 PM

## 2019-05-04 ENCOUNTER — Telehealth: Payer: Self-pay

## 2019-05-04 LAB — PROSTATE-SPECIFIC AG, SERUM (LABCORP): Prostate Specific Ag, Serum: 441 ng/mL — ABNORMAL HIGH (ref 0.0–4.0)

## 2019-05-04 NOTE — Telephone Encounter (Signed)
Called patient to make him aware of PSA results. No answer. Left voicemail requesting patient return call to [phone].

## 2019-05-04 NOTE — Telephone Encounter (Signed)
-----   Message from Wyatt Portela, MD sent at 05/04/2019  8:20 AM EDT ----- Please let him know his PSA is down.

## 2019-05-09 ENCOUNTER — Telehealth: Payer: Self-pay

## 2019-05-09 NOTE — Telephone Encounter (Signed)
Received message from patient brother Alejandro [name] with questions and concerns about the patient. Contacted the patient and left message that we need his permission to speak with his brother since we do not have a DPR on file. Received return call from patient stating that his brother Alejandro [name] is his POA and we can speak with him. He will also drop off the DPR for our records as well.

## 2019-05-10 ENCOUNTER — Other Ambulatory Visit: Payer: Self-pay

## 2019-05-10 ENCOUNTER — Inpatient Hospital Stay: Payer: BC Managed Care – PPO | Attending: Oncology | Admitting: Medical

## 2019-05-10 ENCOUNTER — Encounter (HOSPITAL_COMMUNITY): Payer: Self-pay

## 2019-05-10 ENCOUNTER — Telehealth: Payer: Self-pay

## 2019-05-10 ENCOUNTER — Emergency Department (HOSPITAL_COMMUNITY): Payer: BC Managed Care – PPO

## 2019-05-10 ENCOUNTER — Ambulatory Visit: Payer: BC Managed Care – PPO

## 2019-05-10 ENCOUNTER — Emergency Department (HOSPITAL_COMMUNITY)
Admission: EM | Admit: 2019-05-10 | Discharge: 2019-05-10 | Disposition: A | Discharge status: Home / Self Care | Payer: BC Managed Care – PPO | Attending: Emergency Medicine | Admitting: Emergency Medicine

## 2019-05-10 DIAGNOSIS — C7951 Secondary malignant neoplasm of bone: Secondary | ICD-10-CM

## 2019-05-10 DIAGNOSIS — R4182 Altered mental status, unspecified: Secondary | ICD-10-CM | POA: Diagnosis not present

## 2019-05-10 DIAGNOSIS — Z79899 Other long term (current) drug therapy: Secondary | ICD-10-CM | POA: Insufficient documentation

## 2019-05-10 DIAGNOSIS — C61 Malignant neoplasm of prostate: Secondary | ICD-10-CM

## 2019-05-10 DIAGNOSIS — Z66 Do not resuscitate: Secondary | ICD-10-CM | POA: Insufficient documentation

## 2019-05-10 DIAGNOSIS — I10 Essential (primary) hypertension: Secondary | ICD-10-CM | POA: Insufficient documentation

## 2019-05-10 DIAGNOSIS — Z20828 Contact with and (suspected) exposure to other viral communicable diseases: Secondary | ICD-10-CM | POA: Diagnosis not present

## 2019-05-10 DIAGNOSIS — C7931 Secondary malignant neoplasm of brain: Secondary | ICD-10-CM | POA: Insufficient documentation

## 2019-05-10 DIAGNOSIS — Z87891 Personal history of nicotine dependence: Secondary | ICD-10-CM | POA: Diagnosis not present

## 2019-05-10 DIAGNOSIS — R5383 Other fatigue: Secondary | ICD-10-CM | POA: Diagnosis present

## 2019-05-10 LAB — URINALYSIS, ROUTINE W REFLEX MICROSCOPIC
Bilirubin Urine: NEGATIVE
Glucose, UA: NEGATIVE mg/dL
Hgb urine dipstick: NEGATIVE
Ketones, ur: 5 mg/dL — AB
Leukocytes,Ua: NEGATIVE
Nitrite: NEGATIVE
Protein, ur: NEGATIVE mg/dL
Specific Gravity, Urine: 1.008 (ref 1.005–1.030)
pH: 6 (ref 5.0–8.0)

## 2019-05-10 LAB — SARS CORONAVIRUS 2 BY RT PCR (HOSPITAL ORDER, PERFORMED IN ~~LOC~~ HOSPITAL LAB): SARS Coronavirus 2: NEGATIVE

## 2019-05-10 LAB — CBC WITH DIFFERENTIAL/PLATELET
Abs Immature Granulocytes: 0.04 10*3/uL (ref 0.00–0.07)
Basophils Absolute: 0 10*3/uL (ref 0.0–0.1)
Basophils Relative: 0 %
Eosinophils Absolute: 0 10*3/uL (ref 0.0–0.5)
Eosinophils Relative: 0 %
HCT: 35.8 % — ABNORMAL LOW (ref 39.0–52.0)
Hemoglobin: 11.5 g/dL — ABNORMAL LOW (ref 13.0–17.0)
Immature Granulocytes: 1 %
Lymphocytes Relative: 8 %
Lymphs Abs: 0.3 10*3/uL — ABNORMAL LOW (ref 0.7–4.0)
MCH: 30.3 pg (ref 26.0–34.0)
MCHC: 32.1 g/dL (ref 30.0–36.0)
MCV: 94.2 fL (ref 80.0–100.0)
Monocytes Absolute: 0.3 10*3/uL (ref 0.1–1.0)
Monocytes Relative: 7 %
Neutro Abs: 3 10*3/uL (ref 1.7–7.7)
Neutrophils Relative %: 84 %
Platelets: 151 10*3/uL (ref 150–400)
RBC: 3.8 MIL/uL — ABNORMAL LOW (ref 4.22–5.81)
RDW: 16.2 % — ABNORMAL HIGH (ref 11.5–15.5)
WBC: 3.6 10*3/uL — ABNORMAL LOW (ref 4.0–10.5)
nRBC: 0 % (ref 0.0–0.2)

## 2019-05-10 LAB — BASIC METABOLIC PANEL
Anion gap: 10 (ref 5–15)
BUN: 10 mg/dL (ref 8–23)
CO2: 22 mmol/L (ref 22–32)
Calcium: 8.4 mg/dL — ABNORMAL LOW (ref 8.9–10.3)
Chloride: 100 mmol/L (ref 98–111)
Creatinine, Ser: 0.71 mg/dL (ref 0.61–1.24)
GFR calc Af Amer: >60 mL/min (ref >60)
GFR calc non Af Amer: >60 mL/min (ref >60)
Glucose, Bld: 104 mg/dL — ABNORMAL HIGH (ref 70–99)
Potassium: 3.8 mmol/L (ref 3.5–5.1)
Sodium: 132 mmol/L — ABNORMAL LOW (ref 135–145)

## 2019-05-10 LAB — SARS CORONAVIRUS 2 (TAT 6-24 HRS): SARS Coronavirus 2: NEGATIVE

## 2019-05-10 MED ORDER — DEXAMETHASONE 4 MG PO TABS: 4.0000 mg | ORAL_TABLET | Freq: Two times a day (BID) | ORAL | 0 refills | Status: DC

## 2019-05-10 MED ORDER — LORAZEPAM 2 MG/ML IJ SOLN
1.0000 mg | Freq: Once | INTRAMUSCULAR | Status: AC
  Administered 2019-05-10: 21:00:00 1 mg via INTRAVENOUS
  Filled 2019-05-10: qty 1

## 2019-05-10 MED ORDER — DEXAMETHASONE SODIUM PHOSPHATE 10 MG/ML IJ SOLN
10.0000 mg | Freq: Once | INTRAMUSCULAR | Status: AC
  Administered 2019-05-10: 10 mg via INTRAVENOUS
  Filled 2019-05-10: qty 1

## 2019-05-10 MED ORDER — GADOBUTROL 1 MMOL/ML IV SOLN
10.0000 mL | Freq: Once | INTRAVENOUS | Status: AC | PRN
  Administered 2019-05-10: 10 mL via INTRAVENOUS

## 2019-05-10 NOTE — ED Notes (Signed)
Patient transported to MRI 

## 2019-05-10 NOTE — ED Triage Notes (Signed)
Pt brought to the ED from [facility] for increased fatigue/weakness. During screening, pt reports that he had dinner with 4 other people recently, and since then two of those people have tested positive for COVID 19.

## 2019-05-10 NOTE — ED Notes (Signed)
Spoke with MRI- will wait until Covid test results are available before doing scan.

## 2019-05-10 NOTE — Discharge Instructions (Addendum)
Your COVID is negative today.   You have brain mets that are stable.   Take decadron 4 mg twice daily   Call Dr. Hazeline Junker office tomorrow for follow up   Return to ER if you have worse confusion, fever, lethargy.

## 2019-05-10 NOTE — Telephone Encounter (Signed)
Returned call to patient brother Cyndy Freeze concerned about patient change in status over the weekend. He stated that his brother has become more confused, saying inappropriate things, cannot remember anything. He is unable to live on his own at this point so his son is staying with him and they are hiring personal care services to be present for the patient. He asked a lot of questions about hospice, if appropriate, who makes the referral, and what does the care look like. All these questions were answered. Also discussed [facility] visit for assessment recommended by Dr. Alen Blew. Cyndy Freeze understands that a scheduler will be calling him for appointment times for lab and [facility] visit today. Cyndy Freeze also asked if he or the son can be present on the visit today to understand the changes. Per Sandi Mealy PA one person can attend the visit today. A message has been left with [place] to call back and let me know who will be attending the visit with the patient.

## 2019-05-10 NOTE — ED Notes (Signed)
Pt was verbalized discharge instructions. Pt had no further questions at this time. NAD. 

## 2019-05-10 NOTE — Progress Notes (Signed)
Symptoms Management Clinic Progress Note   Alejandro [name] [id number] [birth year] 63 y.o.  Alejandro [name]. is managed by Dr. Zola Button  Actively treated with chemotherapy/immunotherapy/hormonal therapy: no  Next scheduled appointment with provider: 07/06/2019  Assessment: Plan:    Prostate cancer metastatic to bone [facility])  Altered mental status, unspecified altered mental status type   Metastatic prostate cancer: The patient is managed by Dr. Alen Blew with no plans for additional chemotherapy. He is scheduled to be see in follow up on 07/06/2019  Altered mental status: Initially it was planned to see the patient with a calcium and chemistry panel collected however the patient was taken to the ER after it was determined that he had been exposed to COVID-19 this past weekend. This was not communicated when the patient's family called for an appointment this morning.  Please see After Visit Summary for patient specific instructions.  Future Appointments  Date Time Provider [place]  05/19/2019  1:30 PM Freeman Caldron, PA-C [facility] None  05/30/2019  8:30 AM Miquel Dunn, NP [facility] None  07/06/2019  9:00 AM [facility] LAB 3 [facility] None  07/06/2019  9:30 AM Shadad, Mathis Dad, MD [facility] None    No orders of the defined types were placed in this encounter.      Subjective:   Patient ID:  Alejandro [name]. is a 63 y.o. (DOB 1956/04/23) male.  Chief Complaint: No chief complaint on file.   HPI [employer].  Is a 63 y.o. male with a diagnosis of a metastatic prostate cancer. He is managed by Dr. Alen Blew with no plans for additional chemotherapy. His brother Cyndy Freeze called this morning and was concerned about patient change in mental status over the weekend. He stated that his brother had become more confused, was saying inappropriate things, and could not remember anything. The patient is unable to live on his  own at this point. His son is staying with him. The family is hiring personal care services to be present for the patient. He asked a lot of questions about hospice, if appropriate. Initially it was planned to see the patient with a calcium and chemistry panel collected however the patient was taken to the ER after he reported that he had dinner this past weekend with 4 persons with 2 since testing positive for COVID-19 this past weekend. This was not communicated when the patient's family called for an appointment this morning. 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     Prior Therapy:   Status post biopsy done on July of 2015 followed by lymph node dissection completed in 2015 at [facility].  He was treated there with definitive radiation therapy utilizing seed implant as well as external beam radiation completed in 2016.  He developed advanced disease with bone involvement.  He received radiation therapy to the pelvis in 2017.  Xtandi 160 mg daily that was poorly tolerated and was discontinued.  He was switched to [name] which she took until 2019 and developed progression of disease with bony metastasis as well as PSA of 80.  Taxotere chemotherapy at 75 mg/m cycle 1 given in March 2019.  He is status post 6 cycles of chemotherapy last cycle given on February 03, 2018.   Status post clinical trial utilizing Darolutamide as well as LuPSAM a part of a clinical trial at [facility] in [place].  He  is status post 4 cycles of therapy.  Last cycle given on October 01, 2018.  Therapy discontinued as a progression of disease.    Lynparza 300 mg twice daily started in July 2020.  Therapy discontinued because of poor tolerance and patient wishes.  Current therapy:   Lupron 30 mg every 4 months.  Therapy due in September  2020.  Xgeva 120 mg every 4 weeks.  Is due to receive injections #2020.    Medications: I have reviewed the patient's current medications.  Allergies:  Allergies  Allergen Reactions   Codeine Nausea Only    Past Medical History:  Diagnosis Date   ANXIETY    AORTIC STENOSIS, MILD    CHEST PAIN-UNSPECIFIED    DYSPNEA    HYPERCHOLESTEROLEMIA    Hypertension    INSOMNIA    OSA on CPAP 05/31/2013   OSTEOARTHRITIS, KNEE    Prostate cancer ([place])     Past Surgical History:  Procedure Laterality Date   COLONOSCOPY     ELBOW SURGERY     LUMBAR LAMINECTOMY  Late 1990's   NEUROPLASTY / TRANSPOSITION MEDIAN NERVE AT CARPAL TUNNEL BILATERAL     PROSTATE BIOPSY      Family History  Problem Relation Age of Onset   Prostate cancer Father    Breast cancer Neg Hx    Colon cancer Neg Hx     Social History   Socioeconomic History   Marital status: Divorced    Spouse name: Not on file   Number of children: 1   Years of education: college   Highest education level: Not on file  Occupational History   Occupation: CONSULTANT    Employer: [employer]    Comment: No longer working  Scientist, product/process development strain: Not on file   Food insecurity    Worry: Not on file    Inability: Not on file   Transportation needs    Medical: Not on file    Non-medical: Not on file  Tobacco Use   Smoking status: Former Smoker    Packs/day: 3.00    Years: 25.00    Pack years: 75.00    Types: Cigars, Cigarettes    Quit date: 12/03/1998    Years since quitting: 20.4   Smokeless tobacco: Never Used   Tobacco comment: Former 3 ppd smoker for 30 years quit heavy smoking 15 years ago  Substance and Sexual Activity   Alcohol use: Yes    Alcohol/week: 12.0 standard drinks    Types: 12 Standard drinks or equivalent per week    Comment: 2 drinks per day   Drug use: Not Currently    Types: Cocaine    Comment: in his 80s   Sexual activity: Yes    Lifestyle   Physical activity    Days per week: Not on file    Minutes per session: Not on file   Stress: Not on file  Relationships   Social connections    Talks on phone: Not on file    Gets together: Not on file    Attends religious service: Not on file    Active member of club or organization: Not on file    Attends meetings of clubs or organizations: Not on file    Relationship status: Not on file   Intimate partner violence    Fear of current or ex partner: Not on file    Emotionally abused: Not on file    Physically abused: Not on file  Forced sexual activity: Not on file  Other Topics Concern   Not on file  Social History Narrative   Not on file    Past Medical History, Surgical history, Social history, and Family history were reviewed and updated as appropriate.   Please see review of systems for further details on the patient's review from today.   Review of Systems:  Review of Systems  Constitutional: Negative for chills, diaphoresis and fever.  HENT: Negative for trouble swallowing and voice change.   Respiratory: Negative for cough, chest tightness, shortness of breath and wheezing.   Cardiovascular: Negative for chest pain and palpitations.  Gastrointestinal: Negative for abdominal pain, constipation, diarrhea, nausea and vomiting.  Musculoskeletal: Negative for back pain and myalgias.  Neurological: Negative for dizziness, light-headedness and headaches.  Psychiatric/Behavioral: Positive for behavioral problems and confusion.    Objective:   Physical Exam:  There were no vitals taken for this visit. ECOG: 1  Physical Exam Constitutional:      General: He is not in acute distress.    Appearance: He is not ill-appearing or diaphoretic.  HENT:     Head: Normocephalic and atraumatic.  Neurological:     Gait: Gait abnormal (The patient is ambulating with a wheelchair.).    The remainder of a PE was differed as the patient was taken directly  to the ER.  Lab Review:     Component Value Date/Time   NA 138 05/03/2019 1146   NA 135 (L) 06/13/2014 0944   K 4.5 05/03/2019 1146   K 5.4 (H) 06/13/2014 0944   CL 101 05/03/2019 1146   CO2 28 05/03/2019 1146   CO2 25 06/13/2014 0944   GLUCOSE 99 05/03/2019 1146   GLUCOSE 121 06/13/2014 0944   BUN 15 05/03/2019 1146   BUN 18.8 06/13/2014 0944   CREATININE 0.82 05/03/2019 1146   CREATININE 1.0 06/13/2014 0944   CALCIUM 9.0 05/03/2019 1146   CALCIUM 10.0 06/13/2014 0944   PROT 6.2 (L) 05/03/2019 1146   PROT 7.5 06/13/2014 0944   ALBUMIN 3.4 (L) 05/03/2019 1146   ALBUMIN 4.0 06/13/2014 0944   AST 19 05/03/2019 1146   AST 104 (H) 06/13/2014 0944   ALT 21 05/03/2019 1146   ALT 126 (H) 06/13/2014 0944   ALKPHOS 82 05/03/2019 1146   ALKPHOS 96 06/13/2014 0944   BILITOT 0.6 05/03/2019 1146   BILITOT 0.63 06/13/2014 0944   GFRNONAA >60 05/03/2019 1146   GFRAA >60 05/03/2019 1146       Component Value Date/Time   WBC 1.9 (L) 05/03/2019 1146   WBC 11.2 (H) 04/06/2019 0405   RBC 3.61 (L) 05/03/2019 1146   HGB 10.9 (L) 05/03/2019 1146   HGB 14.5 06/13/2014 0944   HCT 35.2 (L) 05/03/2019 1146   HCT 42.4 06/13/2014 0944   PLT 74 (L) 05/03/2019 1146   PLT 202 06/13/2014 0944   MCV 97.5 05/03/2019 1146   MCV 92.2 06/13/2014 0944   MCH 30.2 05/03/2019 1146   MCHC 31.0 05/03/2019 1146   RDW 17.4 (H) 05/03/2019 1146   RDW 12.7 06/13/2014 0944   LYMPHSABS 0.2 (L) 05/03/2019 1146   LYMPHSABS 1.8 06/13/2014 0944   MONOABS 0.2 05/03/2019 1146   MONOABS 0.9 06/13/2014 0944   EOSABS 0.0 05/03/2019 1146   EOSABS 0.1 06/13/2014 0944   BASOSABS 0.0 05/03/2019 1146   BASOSABS 0.1 06/13/2014 0944   -------------------------------  Imaging from last 24 hours (if applicable):  Radiology interpretation: No results found.  This case was discussed Dr. Alen Blew. He expressed agreement with my management of this patient.

## 2019-05-10 NOTE — ED Notes (Signed)
Pt ambulated to the restroom no assistance. Gait steady

## 2019-05-10 NOTE — ED Notes (Signed)
Pt transported to MRI 

## 2019-05-10 NOTE — ED Provider Notes (Signed)
[facility] DEPT Provider Note   CSN: [id number] Arrival date & time: 05/10/19  1156     History   Chief Complaint Chief Complaint  Patient presents with  . Fatigue    ca pt    HPI Kelvin Rourke Kase. is a 63 y.o. male.     HPI   63 year old male with change in mental status.  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  This past weekend patient has had some confusion.  Noted by family to be very disoriented at times.  Saying some inappropriate things.  Patient acknowledges this himself but is not quite sure why.  He states that he has been very tired recently.  He feels unsteady when he is walking.  Is complaining of some facial/frontal headache and feels like "my brain is jiggling when I move."  Denies room spinning sensation though.  He reports receiving radiation therapy several weeks ago.  From his description, it sounds like this is more for palliative reasons.  He states that he is not getting chemotherapy.  He denies any fevers.  No respiratory complaints.  Denies any recent medication changes.  Past Medical History:  Diagnosis Date  . ANXIETY   . AORTIC STENOSIS, MILD   . CHEST PAIN-UNSPECIFIED   . DYSPNEA   . HYPERCHOLESTEROLEMIA   . Hypertension   . INSOMNIA   . OSA on CPAP 05/31/2013  . OSTEOARTHRITIS, KNEE   . Prostate cancer [facility])     Patient Active Problem List   Diagnosis Date Noted  . Dehydration   . Dizziness 04/06/2019  . Palliative care by specialist   . DNR (do not resuscitate)   . Weakness 04/05/2019  . Cancer related pain 03/30/2019  . Cancer associated pain 03/30/2019  . Secondary and unspecified malignant neoplasm of lymph nodes of head, face and neck ([place]) 03/27/2019  . Atrial fibrillation ([place]) 01/13/2019  . Orthostatic hypotension 12/03/2018  . Abdominal aortic atherosclerosis ([place]): Severe calcification and ectasia 12/03/2018  . Encounter for antineoplastic chemotherapy  12/04/2017  . Prostate cancer metastatic to bone ([place]) 11/19/2017  . Fever in adult 11/19/2017  . Mucositis due to antineoplastic therapy 11/19/2017  . Lactic acid acidosis 11/19/2017  . Fever 11/19/2017  . Neutropenic fever ([place]) 10/30/2017  . 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 OSA on CPAP 05/31/2013  . DYSPNEA 01/23/2010  . CHEST PAIN-UNSPECIFIED 01/23/2010  . ANXIETY 12/10/2009  . Osteoarthrosis involving lower leg 12/10/2009  . INSOMNIA 12/10/2009  . HYPERCHOLESTEROLEMIA 12/07/2009  . Essential hypertension 12/07/2009  . Aortic valve disorder 12/07/2009  . CAD, native coronary artery: Occluded RCA (CTO) 2009 10/13/2007    Past Surgical History:  Procedure Laterality Date  . COLONOSCOPY    . ELBOW SURGERY    . LUMBAR LAMINECTOMY  Late 1990's  . NEUROPLASTY / TRANSPOSITION MEDIAN NERVE AT CARPAL TUNNEL BILATERAL    . PROSTATE BIOPSY          Home Medications    Prior to Admission medications   Medication Sig Start Date End Date Taking? Authorizing Provider  acetaminophen (TYLENOL) 325 MG tablet Take 650 mg by mouth every 6 (six) hours as needed for mild pain or fever.   Yes [provider]  alum & mag hydroxide-simeth (MAALOX/MYLANTA) 200-200-20 MG/5ML suspension Take 30 mLs by mouth every 6 (six) hours as needed for indigestion or heartburn.   Yes [provider]  amitriptyline (ELAVIL) 10 MG tablet Take 20 mg by mouth at bedtime.   Yes [provider]  HYDROmorphone (DILAUDID) 4 MG tablet Take 1 to 2 every 3 hrs as needed. 05/03/19  Yes Wyatt Portela, MD  ibuprofen (ADVIL) 200 MG tablet Take 1,200 mg by mouth every 6 (six) hours as needed for fever, headache or moderate pain.   Yes [provider]  morphine (MS CONTIN) 15 MG 12 hr tablet Take 1 tablet (15 mg total) by mouth every 12 (twelve) hours. 05/03/19  Yes Wyatt Portela, MD  nebivolol (BYSTOLIC) 5  MG tablet Take 1 tablet (5 mg total) by mouth daily. 05/03/19  Yes Adrian Prows, MD  tamsulosin (FLOMAX) 0.4 MG CAPS capsule TAKE 1 CAPSULE BY MOUTH TWICE DAILY 05/02/19  Yes Wyatt Portela, MD  chlorproMAZINE (THORAZINE) 10 MG tablet Take 1 tablet (10 mg total) by mouth 2 (two) times daily as needed for hiccoughs. Patient not taking: Reported on 04/18/2019 04/06/19   Eugenie Filler, MD  dexamethasone (DECADRON) 4 MG tablet Take 1 tablet (4 mg total) by mouth every 12 (twelve) hours. Patient not taking: Reported on 05/10/2019 04/06/19   Eugenie Filler, MD  feeding supplement, ENSURE ENLIVE, (ENSURE ENLIVE) LIQD Take 237 mLs by mouth 2 (two) times daily between meals. Patient not taking: Reported on 05/10/2019 04/06/19   Eugenie Filler, MD  meclizine (ANTIVERT) 25 MG tablet Take 1 tablet (25 mg total) by mouth 3 (three) times daily as needed for dizziness (vertigo). Patient not taking: Reported on 04/18/2019 04/06/19   Eugenie Filler, MD  prochlorperazine (COMPAZINE) 10 MG tablet Take 1 tablet (10 mg total) by mouth every 6 (six) hours as needed for nausea or vomiting. Patient not taking: Reported on 04/18/2019 03/18/19   Wyatt Portela, MD  senna-docusate (SENOKOT-S) 8.6-50 MG tablet Take 1 tablet by mouth 2 (two) times daily. Patient not taking: Reported on 04/18/2019 04/06/19   Eugenie Filler, MD    Family History Family History  Problem Relation Age of Onset  . Prostate cancer Father   . Breast cancer Neg Hx   . Colon cancer Neg Hx     Social History Social History   Tobacco Use  . Smoking status: Former Smoker    Packs/day: 3.00    Years: 25.00    Pack years: 75.00    Types: Cigars, Cigarettes    Quit date: 12/03/1998    Years since quitting: 20.4  . Smokeless tobacco: Never Used  . Tobacco comment: Former 3 ppd smoker for 30 years quit heavy smoking 15 years ago  Substance Use Topics  . Alcohol use: Yes    Alcohol/week: 12.0 standard drinks    Types: 12 Standard drinks or  equivalent per week    Comment: 2 drinks per day  . Drug use: Not Currently    Types: Cocaine    Comment: in his 24s     Allergies   Codeine   Review of Systems Review of Systems  All systems reviewed and negative, other than as noted in HPI.  Physical Exam Updated Vital Signs BP (!) 147/103   Pulse (!) 54   Temp 98.3 F (36.8 C) (Oral)   Resp 16   Ht 5\' 10"  (1.778 m)   Wt 99.8 kg   SpO2 98%   BMI 31.57 kg/m   Physical Exam Vitals signs and nursing note reviewed.  Constitutional:      General: He is not in  acute distress.    Appearance: He is well-developed.  HENT:     Head: Normocephalic and atraumatic.  Eyes:     General:        Right eye: No discharge.        Left eye: No discharge.     Conjunctiva/sclera: Conjunctivae normal.  Neck:     Musculoskeletal: Neck supple.  Cardiovascular:     Rate and Rhythm: Normal rate and regular rhythm.     Heart sounds: Normal heart sounds. No murmur. No friction rub. No gallop.   Pulmonary:     Effort: Pulmonary effort is normal. No respiratory distress.     Breath sounds: Normal breath sounds.  Abdominal:     General: There is no distension.     Palpations: Abdomen is soft.     Tenderness: There is no abdominal tenderness.  Musculoskeletal:        General: No tenderness.  Skin:    General: Skin is warm and dry.  Neurological:     General: No focal deficit present.     Mental Status: He is alert and oriented to person, place, and time.     Cranial Nerves: No cranial nerve deficit.     Sensory: No sensory deficit.     Motor: No weakness.     Coordination: Coordination normal.  Psychiatric:        Behavior: Behavior normal.        Thought Content: Thought content normal.      ED Treatments / Results  Labs (all labs ordered are listed, but only abnormal results are displayed) Labs Reviewed  BASIC METABOLIC PANEL - Abnormal; Notable for the following components:      Result Value   Sodium 132 (*)     Glucose, Bld 104 (*)    Calcium 8.4 (*)    All other components within normal limits  URINALYSIS, ROUTINE W REFLEX MICROSCOPIC - Abnormal; Notable for the following components:   Ketones, ur 5 (*)    All other components within normal limits  CBC WITH DIFFERENTIAL/PLATELET - Abnormal; Notable for the following components:   WBC 3.6 (*)    RBC 3.80 (*)    Hemoglobin 11.5 (*)    HCT 35.8 (*)    RDW 16.2 (*)    Lymphs Abs 0.3 (*)    All other components within normal limits  SARS CORONAVIRUS 2 (TAT 6-24 HRS)  SARS CORONAVIRUS 2 (HOSPITAL ORDER, [facility])  CBC WITH DIFFERENTIAL/PLATELET  CBC WITH DIFFERENTIAL/PLATELET    EKG None  Radiology No results found.  Procedures Procedures (including critical care time)  Medications Ordered in ED Medications  LORazepam (ATIVAN) injection 1 mg (0 mg Intravenous Hold 05/10/19 1449)  dexamethasone (DECADRON) injection 10 mg (has no administration in time range)     Initial Impression / Assessment and Plan / ED Course  I have reviewed the triage vital signs and the nursing notes.  Pertinent labs & imaging results that were available during my care of the patient were reviewed by me and considered in my medical decision making (see chart for details).        63 year old male with some confusion recently.  He is actually quite lucid on my examination.  Answering all questions appropriately, joking some and being sarcastic.  His symptoms are concerning though.  He does acknowledge that he has felt confused recently and also very tired.  He also reports feeling increasingly off balance.  He has known  metastatic disease to his brain.  Will MRI in today to further evaluate.  Will check basic labs, COVID and urinalysis.  Care signed out to Dr. Darl Householder with labs and imaging pending.  Final Clinical Impressions(s) / ED Diagnoses   Final diagnoses:  Brain metastases [facility])    ED Discharge Orders    None        Virgel Manifold, MD 05/12/19 1040

## 2019-05-10 NOTE — Progress Notes (Signed)
Pt transported to [facility] via w/c with belongings by PA Lucianne Lei who also gave bedside report.

## 2019-05-10 NOTE — Progress Notes (Signed)
Called Ed around 8pm about pts MRI informed RN that pt needs to be in the MRI department around 8:50 for his scan. Pt still has not arrived at 9:15.

## 2019-05-10 NOTE — ED Provider Notes (Signed)
  Physical Exam  BP (!) 150/89 (BP Location: Right Arm)   Pulse (!) 49   Temp 98.3 F (36.8 C) (Oral)   Resp 20   Ht 5\' 10"  (1.778 m)   Wt 99.8 kg   SpO2 96%   BMI 31.57 kg/m   Physical Exam  ED Course/Procedures     Procedures  MDM  Patient care assumed at 4 pm. Patient here with possible COVID exposure, confusion. Patient has known mets to brain and sign out pending COVID, brain MRI w/wo contrast.  7pm Patient is a difficult IV stick. I placed [place] IV L forearm. COVID was initially sent as 24 hour test so repeat rapid COVID was sent and was negative.   10:51 PM MRI showed unchanged mets. There is some edema. I talked to Dr. Inda Merlin. He agreed with discharge patient and decadron 4 mg BID. He will follow up with [facility] office tomorrow.    Angiocath insertion Performed by: Wandra Arthurs  Consent: Verbal consent obtained. Risks and benefits: risks, benefits and alternatives were discussed Time out: Immediately prior to procedure a "time out" was called to verify the correct patient, procedure, equipment, support staff and site/side marked as required.  Preparation: Patient was prepped and draped in the usual sterile fashion.  Vein Location: L antecube   Ultrasound Guided  Gauge: 20 long   Normal blood return and flush without difficulty Patient tolerance: Patient tolerated the procedure well with no immediate complications.          Drenda Freeze, MD 05/10/19 2252

## 2019-05-19 ENCOUNTER — Ambulatory Visit
Admission: RE | Admit: 2019-05-19 | Discharge: 2019-05-19 | Disposition: A | Discharge status: Home / Self Care | Payer: BC Managed Care – PPO | Source: Ambulatory Visit | Attending: Urology | Admitting: Urology

## 2019-05-19 ENCOUNTER — Encounter: Payer: Self-pay | Admitting: Urology

## 2019-05-19 ENCOUNTER — Telehealth: Payer: Self-pay | Admitting: Urology

## 2019-05-19 ENCOUNTER — Other Ambulatory Visit: Payer: Self-pay

## 2019-05-19 NOTE — Telephone Encounter (Signed)
I attempted to reach the patient by phone at his preferred number as listed under his appointment but had to leave a message on voicemail requesting that he return my call to conduct his routine scheduled 1 month follow-up visit.  I will await his return call and look forward to speaking to him then.  Nicholos Johns, MMS, PA-C [place] [facility] [place]: [phone]  Fax: [phone]

## 2019-05-24 ENCOUNTER — Telehealth: Payer: Self-pay

## 2019-05-24 NOTE — Telephone Encounter (Signed)
-----   Message from Wyatt Portela, MD sent at 05/24/2019  7:14 AM EDT ----- Regarding: RE: Patient Question about Medications Please let him know to take it once a day stating today or tomorrow. He will take it for a week, then take half (2mg ) for a week then stop. Thanks ----- Message ----- From: Teodoro Spray, RN Sent: 05/23/2019  12:11 PM EDT To: Wyatt Portela, MD Subject: Patient Question about Medications             Patient called stating he is taking Decadron 4 mg twice daily and wants to know if he should continue taking this dose or if he needs to start to taper the dose. Last prescribed when patient was seen in [facility] ED on 05/10/19. Please advise.  Thank you

## 2019-05-24 NOTE — Telephone Encounter (Signed)
Called patient and informed him of information listed below. Patient verbalized understanding. Patient instructed to call the office if he had any further questions.

## 2019-05-29 ENCOUNTER — Other Ambulatory Visit: Payer: Self-pay | Admitting: Oncology

## 2019-05-29 DIAGNOSIS — C61 Malignant neoplasm of prostate: Secondary | ICD-10-CM

## 2019-05-29 DIAGNOSIS — C7951 Secondary malignant neoplasm of bone: Secondary | ICD-10-CM

## 2019-05-30 ENCOUNTER — Ambulatory Visit (INDEPENDENT_AMBULATORY_CARE_PROVIDER_SITE_OTHER): Payer: BC Managed Care – PPO | Admitting: Cardiology

## 2019-05-30 ENCOUNTER — Encounter: Payer: Self-pay | Admitting: Cardiology

## 2019-05-30 ENCOUNTER — Other Ambulatory Visit: Payer: Self-pay

## 2019-05-30 VITALS — BP 148/84 | HR 57 | Temp 96.0°F | Ht 70.0 in | Wt 223.6 lb

## 2019-05-30 DIAGNOSIS — I10 Essential (primary) hypertension: Secondary | ICD-10-CM

## 2019-05-30 DIAGNOSIS — I480 Paroxysmal atrial fibrillation: Secondary | ICD-10-CM | POA: Diagnosis not present

## 2019-05-30 DIAGNOSIS — C61 Malignant neoplasm of prostate: Secondary | ICD-10-CM | POA: Diagnosis not present

## 2019-05-30 DIAGNOSIS — I351 Nonrheumatic aortic (valve) insufficiency: Secondary | ICD-10-CM

## 2019-05-30 DIAGNOSIS — C7951 Secondary malignant neoplasm of bone: Secondary | ICD-10-CM

## 2019-05-30 NOTE — Progress Notes (Signed)
Primary Physician/Referring:  [name], MD  Patient ID: Alejandro [name]., male    DOB: Jun 12, 1956, 63 y.o.   MRN: [id number]  Chief Complaint  Patient presents with  . Atrial Fibrillation    HPI: Alejandro [name].  is a 63 y.o. male  with  coronary atherosclerosis, coronary angiography in 10/13/2007 had revealed occluded RCA. He had mild disease diffusely in the other vessels, dyslipidemia, hypertension, OSA on CPAP and moderate obesity as well as an abdominal aortic atherosclerosis with severe calcification. He also has chronic orthostatic hypotension and supine hypertension. He has now developed Paroxysmal extremely symptomatic atrial fibrillation with RVR and felt to be not a candidate for anticoagulation.  Patient with fairly aggressive prostate cancer, now felt to be treated for palliative purposes only. Due to metastasis, patient has decided to forego further treatment with chemotherapy and radiation. He states that he just wants to enjoy the life he has left. He is currently on steroids, but will soon be tapering off.   Patient was last seen 6 weeks ago blood pressure was noted to be elevated. I had advised for him to restart low-dose losartan patient wanted to he was having his blood pressure was normalized.  He has continued to note elevated BP. He has not had A. fib.  No heart racing.  Have continued occasional dizziness.  No syncope or near syncope.  Is unable to tolerate metoprolol as it causes him to have marked fatigue.  He is been taking metoprolol on a p.r.n. basis when he goes into atrial fibrillation.   Past Medical History:  Diagnosis Date  . ANXIETY   . AORTIC STENOSIS, MILD   . CHEST PAIN-UNSPECIFIED   . DYSPNEA   . HYPERCHOLESTEROLEMIA   . Hypertension   . INSOMNIA   . OSA on CPAP 05/31/2013  . OSTEOARTHRITIS, KNEE   . Prostate cancer [facility])     Past Surgical History:  Procedure Laterality Date  . COLONOSCOPY    . ELBOW SURGERY    . LUMBAR  LAMINECTOMY  Late 1990's  . NEUROPLASTY / TRANSPOSITION MEDIAN NERVE AT CARPAL TUNNEL BILATERAL    . PROSTATE BIOPSY      Social History   Socioeconomic History  . Marital status: Divorced    Spouse name: Not on file  . Number of children: 1  . Years of education: college  . Highest education level: Not on file  Occupational History  . Occupation: CONSULTANT    Employer: [employer]    Comment: No longer working  Social Needs  . Financial resource strain: Not on file  . Food insecurity    Worry: Not on file    Inability: Not on file  . Transportation needs    Medical: Not on file    Non-medical: Not on file  Tobacco Use  . Smoking status: Former Smoker    Packs/day: 3.00    Years: 25.00    Pack years: 75.00    Types: Cigars, Cigarettes    Quit date: 12/03/1998    Years since quitting: 20.5  . Smokeless tobacco: Never Used  . Tobacco comment: Former 3 ppd smoker for 30 years quit heavy smoking 15 years ago  Substance and Sexual Activity  . Alcohol use: Yes    Alcohol/week: 12.0 standard drinks    Types: 12 Standard drinks or equivalent per week    Comment: 2 drinks per day  . Drug use: Not Currently    Types: Cocaine    Comment: in  his 21s  . Sexual activity: Yes  Lifestyle  . Physical activity    Days per week: Not on file    Minutes per session: Not on file  . Stress: Not on file  Relationships  . Social Herbalist on phone: Not on file    Gets together: Not on file    Attends religious service: Not on file    Active member of club or organization: Not on file    Attends meetings of clubs or organizations: Not on file    Relationship status: Not on file  . Intimate partner violence    Fear of current or ex partner: Not on file    Emotionally abused: Not on file    Physically abused: Not on file    Forced sexual activity: Not on file  Other Topics Concern  . Not on file  Social History Narrative  . Not on file    Current Outpatient  Medications on File Prior to Visit  Medication Sig Dispense Refill  . acetaminophen (TYLENOL) 325 MG tablet Take 650 mg by mouth every 6 (six) hours as needed for mild pain or fever.    Marland Kitchen alum & mag hydroxide-simeth (MAALOX/MYLANTA) 200-200-20 MG/5ML suspension Take 30 mLs by mouth every 6 (six) hours as needed for indigestion or heartburn.    Marland Kitchen amitriptyline (ELAVIL) 10 MG tablet Take 20 mg by mouth at bedtime.    . chlorproMAZINE (THORAZINE) 10 MG tablet Take 1 tablet (10 mg total) by mouth 2 (two) times daily as needed for hiccoughs. 20 tablet 0  . dexamethasone (DECADRON) 4 MG tablet Take 1 tablet (4 mg total) by mouth 2 (two) times daily with a meal. 20 tablet 0  . HYDROmorphone (DILAUDID) 4 MG tablet Take 1 to 2 every 3 hrs as needed. 90 tablet 0  . ibuprofen (ADVIL) 200 MG tablet Take 1,200 mg by mouth every 6 (six) hours as needed for fever, headache or moderate pain.    . meclizine (ANTIVERT) 25 MG tablet Take 1 tablet (25 mg total) by mouth 3 (three) times daily as needed for dizziness (vertigo). 20 tablet 0  . morphine (MS CONTIN) 15 MG 12 hr tablet Take 1 tablet (15 mg total) by mouth every 12 (twelve) hours. 60 tablet 0  . nebivolol (BYSTOLIC) 5 MG tablet Take 1 tablet (5 mg total) by mouth daily. 90 tablet 0  . tamsulosin (FLOMAX) 0.4 MG CAPS capsule TAKE 1 CAPSULE BY MOUTH TWICE DAILY 60 capsule 0   No current facility-administered medications on file prior to visit.    Review of Systems  Constitution: Positive for malaise/fatigue and weight loss (stable and intentional). Negative for chills, decreased appetite and weight gain.  Cardiovascular: Positive for dyspnea on exertion (stable). Negative for leg swelling and syncope.  Endocrine: Negative for cold intolerance.  Hematologic/Lymphatic: Does not bruise/bleed easily.  Musculoskeletal: Negative for joint swelling.  Gastrointestinal: Negative for abdominal pain, anorexia and change in bowel habit.  Genitourinary: Positive for  decreased libido and frequency.  Neurological: Negative for dizziness, headaches and light-headedness.  Psychiatric/Behavioral: Positive for depression. Negative for substance abuse. The patient is nervous/anxious.   All other systems reviewed and are negative.     Objective  Blood pressure (!) 148/84, pulse (!) 57, temperature (!) 96 F (35.6 C), height 5\' 10"  (1.778 m), weight 223 lb 9.6 oz (101.4 kg), SpO2 97 %. Body mass index is 32.08 kg/m.     Physical Exam  Constitutional: He appears  well-developed. No distress.  Mildly obese  HENT:  Head: Atraumatic.  Eyes: Conjunctivae are normal.  Neck: Neck supple. No JVD present. No thyromegaly present.  Cardiovascular: Regular rhythm, intact distal pulses and normal pulses. Exam reveals no gallop, no S3 and no S4.  No murmur heard. No edema  Pulmonary/Chest: Effort normal and breath sounds normal.  Abdominal: Soft. Bowel sounds are normal.  Musculoskeletal: Normal range of motion.  Neurological: He is alert.  Skin: Skin is warm and dry.  Psychiatric: He has a normal mood and affect.  Vitals reviewed.  Radiology: No results found.  Laboratory examination:   CMP Latest Ref Rng & Units 05/10/2019 05/03/2019 04/06/2019  Glucose 70 - 99 mg/dL 104(H) 99 129(H)  BUN 8 - 23 mg/dL 10 15 27(H)  Creatinine 0.61 - 1.24 mg/dL 0.71 0.82 0.72  Sodium 135 - 145 mmol/L 132(L) 138 136  Potassium 3.5 - 5.1 mmol/L 3.8 4.5 4.1  Chloride 98 - 111 mmol/L 100 101 102  CO2 22 - 32 mmol/L 22 28 26   Calcium 8.9 - 10.3 mg/dL 8.4(L) 9.0 8.0(L)  Total Protein 6.5 - 8.1 g/dL - 6.2(L) -  Total Bilirubin 0.3 - 1.2 mg/dL - 0.6 -  Alkaline Phos 38 - 126 U/L - 82 -  AST 15 - 41 U/L - 19 -  ALT 0 - 44 U/L - 21 -   CBC Latest Ref Rng & Units 05/10/2019 05/03/2019 04/06/2019  WBC 4.0 - 10.5 K/uL 3.6(L) 1.9(L) 11.2(H)  Hemoglobin 13.0 - 17.0 g/dL 11.5(L) 10.9(L) 10.3(L)  Hematocrit 39.0 - 52.0 % 35.8(L) 35.2(L) 32.6(L)  Platelets 150 - 400 K/uL 151 74(L) 354    Lipid Panel  No results found for: CHOL, TRIG, HDL, CHOLHDL, VLDL, LDLCALC, LDLDIRECT HEMOGLOBIN A1C No results found for: HGBA1C, MPG TSH Recent Labs    01/13/19 1955  TSH 0.550    Cardiac Studies:   Coronary angiogram 10/13/2007: Mild diffuse CAD involving LAD and circumflex coronary artery.  RCA occluded in the proximal segment, with left-to-right and right-to-left right region collaterals.  Ejection fraction 55% with mild inferobasal hypokinesis.  Mild aortic stenosis.  Lexiscan sestamibi stress test 03/17/2014: 1. Resting EKG demonstrated normal sinus rhythm. Initially patient exercised for 8 minutes and 35 seconds on a Bruce protocol and achieved 13.4 METs. However patient achieved only 73% of age-predicted maximum heart rate, hence stress test had to be terminated and switched over to [place] sestamibi stress test. There is no ST-T wave changes for ischemia with exercise stress test, Lexiscan stress EKG revealed no ST-T wave changes ischemia. 2. The quality of the stress images was good. There is minimal inferior wall diaphragmatic attenuation artifact without evidence of ischemia. Left ventricle systolic function Related by QGS was 64%. The left ventricle was normal in size both in rest and stress images. This is a low risk study.  Echocardiogram [12/03/2016]: Left ventricle cavity is normal in size. Mild concentric hypertrophy of the left ventricle. Normal global wall motion. Normal diastolic filling pattern. Calculated EF 66%. Left atrial cavity is moderately dilated at 4.7 cm. Mild aortic valve leaflet thickening with mild calcification. No evidence of aortic valve stenosis. Mild to moderate aortic regurgitation. Mild (Grade I) mitral regurgitation. Mild tricuspid regurgitation. Mild pulmonary hypertension. Pulmonary artery systolic pressure is estimated at 34 mm Hg. The aortic root is mildly dilated at 4.0 cm. Compared to the study done on 03/30/2014, previously aortic  regurgitation was very mild. Pulmonary hypertension is new.   Assessment     ICD-10-CM  1. Paroxysmal atrial fibrillation (HCC)  I48.0 EKG 12-Lead  2. Mild aortic regurgitation  I35.1 PCV ECHOCARDIOGRAM COMPLETE  3. Essential hypertension  I10   4. Prostate cancer metastatic to bone ([place])  C61    C79.51     CHA2DS2-VASc Score is 2.  Yearly risk of stroke: 2.2%.     EKG 01/13/2019: Atrial fibrillation with rapid ventricular response at the rate of 160 bpm, LVH with repolarization abnormality, cannot exclude inferior and lateral ischemia.  EKG 05/30/2019: Sinus bradycardia at 54 bpm, normal axis, LVH. No evidence of ischemia.   Recommendations:   Patient now has advanced prostate cancer with metastatic disease, due to intracranial bleed that was noted recently after metastases to the skull, he was not started on anticoagulation due to metastatic prostate cancer.  Since last seen by me 6 weeks ago, patient continues to do well without any known recurrence of A. fib.  He does have some continued dizziness on occasion, but this is overall been stable.  He has not yet started losartan, I have encouraged him to start low-dose in view of continued hypertension.  He is planning to come off dexamethasone later this week and he will closely monitor his blood pressure with this.  He is to see oncology in December for follow-up.  He had previously had echocardiogram scheduled, but was canceled in view of issues with his blood pressure.  I will arrange for echocardiogram to be performed for surveillance of aortic regurgitation.  We will plan to see him back in 3 months for follow-up, but encouraged him to contact me sooner if needed.  Miquel Dunn, MSN, APRN, FNP-C [facility] [place] Office: [phone] Fax: [phone]

## 2019-06-13 ENCOUNTER — Other Ambulatory Visit: Payer: Self-pay

## 2019-06-13 ENCOUNTER — Telehealth: Payer: Self-pay

## 2019-06-13 ENCOUNTER — Other Ambulatory Visit: Payer: Self-pay | Admitting: Oncology

## 2019-06-13 MED ORDER — MORPHINE SULFATE ER 15 MG PO TBCR: 15.0000 mg | EXTENDED_RELEASE_TABLET | Freq: Two times a day (BID) | ORAL | 0 refills | Status: AC

## 2019-06-13 MED ORDER — DEXAMETHASONE 4 MG PO TABS: 4.0000 mg | ORAL_TABLET | Freq: Two times a day (BID) | ORAL | 0 refills | Status: DC

## 2019-06-13 MED ORDER — HYDROMORPHONE HCL 4 MG PO TABS: ORAL_TABLET | ORAL | 0 refills | Status: DC

## 2019-06-13 NOTE — Telephone Encounter (Signed)
Called and informed patient of information below. Patient verbalized understanding and will call office in the next 7 to 10 days to give an update on his symptoms. Patient instructed to call office with any questions or concerns.

## 2019-06-13 NOTE — Telephone Encounter (Signed)
-----   Message from Wyatt Portela, MD sent at 06/13/2019 10:17 AM EST ----- Regarding: RE: Patient question/refill request His morphine and Dilaudid was refilled.  I also refilled his dexamethasone to take twice a day for a week and subsequently take it daily and report his symptoms in the next 7 to 10 days.  Thanks ----- Message ----- From: Teodoro Spray, RN Sent: 06/13/2019   8:38 AM EST To: Wyatt Portela, MD Subject: Patient question/refill request                Dr. Alen Blew,   Patient called office reporting extreme dizziness and balance issues for the past week and a half. Patient states he spends most of his days in bed due to this. Patient was seen in ED on 06/10/19 for AMS and was given prescription for meclizine 25 mg. Patient states he is unsure if this helps as he has not been taking it consistently. Patient states when he previously would take decadron, his dizziness and balance issues would be more tolerable.   Patient also is requesting refills for his morphine 15 mg (Last filled on 05/03/19 for 60 tabs) and hydromorphone 4mg  (last filled on 05/03/19 for 90 tabs.   Please advise.  Thank you

## 2019-06-17 ENCOUNTER — Other Ambulatory Visit: Payer: BC Managed Care – PPO

## 2019-06-20 ENCOUNTER — Other Ambulatory Visit: Payer: BC Managed Care – PPO

## 2019-06-22 ENCOUNTER — Other Ambulatory Visit: Payer: Self-pay

## 2019-06-22 ENCOUNTER — Telehealth: Payer: Self-pay

## 2019-06-22 ENCOUNTER — Inpatient Hospital Stay: Payer: BC Managed Care – PPO | Attending: Oncology | Admitting: Medical

## 2019-06-22 ENCOUNTER — Inpatient Hospital Stay: Payer: BC Managed Care – PPO

## 2019-06-22 VITALS — BP 164/96 | HR 60 | Temp 98.0°F | Resp 17 | Ht 70.0 in | Wt 212.1 lb

## 2019-06-22 DIAGNOSIS — C7951 Secondary malignant neoplasm of bone: Secondary | ICD-10-CM

## 2019-06-22 DIAGNOSIS — G893 Neoplasm related pain (acute) (chronic): Secondary | ICD-10-CM | POA: Insufficient documentation

## 2019-06-22 DIAGNOSIS — R110 Nausea: Secondary | ICD-10-CM

## 2019-06-22 DIAGNOSIS — C770 Secondary and unspecified malignant neoplasm of lymph nodes of head, face and neck: Secondary | ICD-10-CM | POA: Insufficient documentation

## 2019-06-22 DIAGNOSIS — R630 Anorexia: Secondary | ICD-10-CM | POA: Diagnosis not present

## 2019-06-22 DIAGNOSIS — C61 Malignant neoplasm of prostate: Secondary | ICD-10-CM | POA: Insufficient documentation

## 2019-06-22 DIAGNOSIS — R112 Nausea with vomiting, unspecified: Secondary | ICD-10-CM | POA: Diagnosis not present

## 2019-06-22 DIAGNOSIS — I10 Essential (primary) hypertension: Secondary | ICD-10-CM | POA: Insufficient documentation

## 2019-06-22 DIAGNOSIS — Z87891 Personal history of nicotine dependence: Secondary | ICD-10-CM | POA: Diagnosis not present

## 2019-06-22 DIAGNOSIS — R4182 Altered mental status, unspecified: Secondary | ICD-10-CM

## 2019-06-22 DIAGNOSIS — E7800 Pure hypercholesterolemia, unspecified: Secondary | ICD-10-CM | POA: Diagnosis not present

## 2019-06-22 DIAGNOSIS — E860 Dehydration: Secondary | ICD-10-CM | POA: Insufficient documentation

## 2019-06-22 LAB — CBC WITH DIFFERENTIAL (CANCER CENTER ONLY)
Abs Immature Granulocytes: 0.1 10*3/uL — ABNORMAL HIGH (ref 0.00–0.07)
Basophils Absolute: 0 10*3/uL (ref 0.0–0.1)
Basophils Relative: 0 %
Eosinophils Absolute: 0.2 10*3/uL (ref 0.0–0.5)
Eosinophils Relative: 3 %
HCT: 32.2 % — ABNORMAL LOW (ref 39.0–52.0)
Hemoglobin: 10.5 g/dL — ABNORMAL LOW (ref 13.0–17.0)
Immature Granulocytes: 2 %
Lymphocytes Relative: 6 %
Lymphs Abs: 0.4 10*3/uL — ABNORMAL LOW (ref 0.7–4.0)
MCH: 29 pg (ref 26.0–34.0)
MCHC: 32.6 g/dL (ref 30.0–36.0)
MCV: 89 fL (ref 80.0–100.0)
Monocytes Absolute: 0.7 10*3/uL (ref 0.1–1.0)
Monocytes Relative: 12 %
Neutro Abs: 4.7 10*3/uL (ref 1.7–7.7)
Neutrophils Relative %: 77 %
Platelet Count: 196 10*3/uL (ref 150–400)
RBC: 3.62 MIL/uL — ABNORMAL LOW (ref 4.22–5.81)
RDW: 15.9 % — ABNORMAL HIGH (ref 11.5–15.5)
WBC Count: 6.1 10*3/uL (ref 4.0–10.5)
nRBC: 0 % (ref 0.0–0.2)

## 2019-06-22 LAB — CMP (CANCER CENTER ONLY)
ALT: 9 U/L (ref 0–44)
AST: 11 U/L — ABNORMAL LOW (ref 15–41)
Albumin: 2.9 g/dL — ABNORMAL LOW (ref 3.5–5.0)
Alkaline Phosphatase: 141 U/L — ABNORMAL HIGH (ref 38–126)
Anion gap: 15 (ref 5–15)
BUN: 11 mg/dL (ref 8–23)
CO2: 23 mmol/L (ref 22–32)
Calcium: 8.7 mg/dL — ABNORMAL LOW (ref 8.9–10.3)
Chloride: 96 mmol/L — ABNORMAL LOW (ref 98–111)
Creatinine: 0.7 mg/dL (ref 0.61–1.24)
GFR, Est AFR Am: >60 mL/min (ref >60)
GFR, Estimated: >60 mL/min (ref >60)
Glucose, Bld: 111 mg/dL — ABNORMAL HIGH (ref 70–99)
Potassium: 3.4 mmol/L — ABNORMAL LOW (ref 3.5–5.1)
Sodium: 134 mmol/L — ABNORMAL LOW (ref 135–145)
Total Bilirubin: 0.7 mg/dL (ref 0.3–1.2)
Total Protein: 6.7 g/dL (ref 6.5–8.1)

## 2019-06-22 MED ORDER — SODIUM CHLORIDE 0.9 % IV SOLN: 10.0000 mg | Freq: Once | INTRAVENOUS | Status: DC

## 2019-06-22 MED ORDER — DEXAMETHASONE SODIUM PHOSPHATE 10 MG/ML IJ SOLN
INTRAMUSCULAR | Status: AC
  Filled 2019-06-22: qty 1

## 2019-06-22 MED ORDER — MORPHINE SULFATE 4 MG/ML IJ SOLN
2.0000 mg | Freq: Once | INTRAMUSCULAR | Status: AC
  Administered 2019-06-22: 2 mg via INTRAVENOUS
  Filled 2019-06-22: qty 1

## 2019-06-22 MED ORDER — DEXAMETHASONE 4 MG PO TABS: 4.0000 mg | ORAL_TABLET | Freq: Two times a day (BID) | ORAL | 1 refills | Status: AC

## 2019-06-22 MED ORDER — MORPHINE SULFATE (PF) 4 MG/ML IV SOLN
INTRAVENOUS | Status: AC
  Filled 2019-06-22: qty 1

## 2019-06-22 MED ORDER — DEXAMETHASONE SODIUM PHOSPHATE 10 MG/ML IJ SOLN
10.0000 mg | Freq: Once | INTRAMUSCULAR | Status: AC
  Administered 2019-06-22: 10 mg via INTRAVENOUS

## 2019-06-22 MED ORDER — ONDANSETRON HCL 4 MG/2ML IJ SOLN
INTRAMUSCULAR | Status: AC
  Filled 2019-06-22: qty 4

## 2019-06-22 MED ORDER — SODIUM CHLORIDE 0.9 % IV SOLN
Freq: Once | INTRAVENOUS | Status: AC
  Administered 2019-06-22: 13:00:00 via INTRAVENOUS
  Filled 2019-06-22: qty 250

## 2019-06-22 MED ORDER — ONDANSETRON HCL 4 MG/2ML IJ SOLN
8.0000 mg | Freq: Once | INTRAMUSCULAR | Status: AC
  Administered 2019-06-22: 8 mg via INTRAVENOUS

## 2019-06-22 NOTE — Patient Instructions (Signed)

## 2019-06-22 NOTE — Telephone Encounter (Signed)
Per Sandi Mealy, PA referral placed to [facility] for hospice services for patient.

## 2019-06-22 NOTE — Telephone Encounter (Signed)
-----   Message from Wyatt Portela, MD sent at 06/22/2019 10:12 AM EST ----- [facility] is ok. He needs to start decadron as well. Thanks ----- Message ----- From: Tami Lin, RN Sent: 06/22/2019   9:50 AM EST To: Wyatt Portela, MD  Patient's brother called and stated patient has been very dizzy the past few days. Patient stays in bed most of the day, sleeps a lot, and is confused. Per brother, patient generally does not feel well. Wants to know if patient can be seen. Do you want me to get him in to Symptom Management? Also, patient is not taking Decadron per brother.  Lanelle Bal

## 2019-06-22 NOTE — Progress Notes (Signed)
Pt reports feeling better at end of infusion with resolution of nausea, improved dizziness/fatigue, and improved headache & generalized/lower back pain.  Denies any questions or concerns at time of d/c, verbalizes understanding to call back and f/u as needed for any further issues.  Pt advised and reports understanding that he should space out his next home oral morphine dose in order to prevent oversedation.  Urine sample obtained and sent to lab.

## 2019-06-22 NOTE — Telephone Encounter (Signed)
Spoke to patient's brother Cyndy Freeze and made him aware that patient can be seen in Symptom Management today at 12:00 with labs at 11:30. [place] verbalized understanding. Per Dr. Alen Blew, refill sent to patient's pharmacy for Decadron. Cyndy Freeze also aware refill sent and verbalized understanding.

## 2019-06-23 LAB — URINE CULTURE: Culture: NO GROWTH

## 2019-06-23 NOTE — Progress Notes (Signed)
Symptoms Management Clinic Progress Note   Alejandro [name] [id number] 24-Feb-1956 63 y.o.  Alejandro [name]. is managed by Dr. Zola Button  Actively treated with chemotherapy/immunotherapy/hormonal therapy: no   Assessment: Plan:    Dehydration - Plan: 0.9 %  sodium chloride infusion, dexamethasone (DECADRON) injection 10 mg  Non-intractable vomiting with nausea, unspecified vomiting type - Plan: ondansetron (ZOFRAN) injection 8 mg, dexamethasone (DECADRON) injection 10 mg, DISCONTINUED: dexamethasone (DECADRON) 10 mg in sodium chloride 0.9 % 50 mL IVPB  Neoplasm related pain - Plan: morphine 4 MG/ML injection 2 mg, morphine 4 MG/ML injection 2 mg  Prostate cancer metastatic to bone (HCC)  Secondary and unspecified malignant neoplasm of lymph nodes of head, face and neck (HCC)  Cancer associated pain  Anorexia  Nausea without vomiting   Dehydration: The patient was given 1 L normal saline IV.  Nausea: The patient was given Zofran 8 mg IV x1 along with Decadron 10 mg IV x1.  Neoplasm related pain: Patient was given morphine sulfate 2 mg IV x2.  Anorexia: Patient was given a prescription for Decadron 4 mg p.o. twice daily.  Metastatic prostate cancer with secondary, unspecified malignant neoplasm of lymph nodes of the head, face and neck: Patient has previously been managed by Dr. Alen Blew.  He and his son request a referral to hospice.  This referral has been made.  Please see After Visit Summary for patient specific instructions.  Future Appointments  Date Time Provider [place]  06/27/2019 11:00 AM [facility]-ECHO/VAS 1 [facility]-IMG None  07/06/2019  9:00 AM [facility] LAB 3 [facility] None  07/06/2019  9:30 AM Wyatt Portela, MD [facility] None  08/29/2019  9:30 AM Adrian Prows, MD [facility]V-PCV None    No orders of the defined types were placed in this encounter.      Subjective:   Patient ID:  Alejandro [name]. is a 63 y.o. (DOB 1955-11-23)  male.  Chief Complaint:  Chief Complaint  Patient presents with  . Dizziness    HPI [facility].  Is a 63 y.o. male with a diagnosis of a metastatic prostate cancer.  Additionally patient has a history of an unknown carcinoma of the head neck.  He presents to the clinic today with his son.  He is currently not being treated with chemotherapy and is being managed with watchful waiting.  He presents to the clinic today stating that he has been having dizziness and a headache for several days.  He has not been out of bed for several days and is not taking his medications.  He has not been taking this morphine due to the fact that he has not been out of bed.  He is having significant back pain today.  He has intermittent confusion, increasing fatigue, nausea, dyspnea on exertion.  He denies fevers, chills, sweats, vomiting, or diarrhea.  He and his son are requesting that the patient be referred to hospice today.  Medications: I have reviewed the patient's current medications.  Allergies:  Allergies  Allergen Reactions  . Codeine Nausea Only    Past Medical History:  Diagnosis Date  . ANXIETY   . AORTIC STENOSIS, MILD   . CHEST PAIN-UNSPECIFIED   . DYSPNEA   . HYPERCHOLESTEROLEMIA   . Hypertension   . INSOMNIA   . OSA on CPAP 05/31/2013  . OSTEOARTHRITIS, KNEE   . Prostate cancer [facility])     Past Surgical History:  Procedure Laterality Date  . COLONOSCOPY    . ELBOW SURGERY    .  LUMBAR LAMINECTOMY  Late 1990's  . NEUROPLASTY / TRANSPOSITION MEDIAN NERVE AT CARPAL TUNNEL BILATERAL    . PROSTATE BIOPSY      Family History  Problem Relation Age of Onset  . Prostate cancer Father   . Breast cancer Neg Hx   . Colon cancer Neg Hx     Social History   Socioeconomic History  . Marital status: Divorced    Spouse name: Not on file  . Number of children: 1  . Years of education: college  . Highest education level: Not on file  Occupational History  . Occupation:  CONSULTANT    Employer: [employer]    Comment: No longer working  Social Needs  . Financial resource strain: Not on file  . Food insecurity    Worry: Not on file    Inability: Not on file  . Transportation needs    Medical: Not on file    Non-medical: Not on file  Tobacco Use  . Smoking status: Former Smoker    Packs/day: 3.00    Years: 25.00    Pack years: 75.00    Types: Cigars, Cigarettes    Quit date: 12/03/1998    Years since quitting: 20.5  . Smokeless tobacco: Never Used  . Tobacco comment: Former 3 ppd smoker for 30 years quit heavy smoking 15 years ago  Substance and Sexual Activity  . Alcohol use: Yes    Alcohol/week: 12.0 standard drinks    Types: 12 Standard drinks or equivalent per week    Comment: 2 drinks per day  . Drug use: Not Currently    Types: Cocaine    Comment: in his 18s  . Sexual activity: Yes  Lifestyle  . Physical activity    Days per week: Not on file    Minutes per session: Not on file  . Stress: Not on file  Relationships  . Social Herbalist on phone: Not on file    Gets together: Not on file    Attends religious service: Not on file    Active member of club or organization: Not on file    Attends meetings of clubs or organizations: Not on file    Relationship status: Not on file  . Intimate partner violence    Fear of current or ex partner: Not on file    Emotionally abused: Not on file    Physically abused: Not on file    Forced sexual activity: Not on file  Other Topics Concern  . Not on file  Social History Narrative  . Not on file    Past Medical History, Surgical history, Social history, and Family history were reviewed and updated as appropriate.   Please see review of systems for further details on the patient's review from today.   Review of Systems:  Review of Systems  Constitutional: Positive for fatigue. Negative for chills, diaphoresis and fever.  HENT: Negative for trouble swallowing and voice  change.   Respiratory: Positive for shortness of breath. Negative for cough, chest tightness and wheezing.   Cardiovascular: Negative for chest pain and palpitations.  Gastrointestinal: Positive for nausea. Negative for abdominal pain, constipation, diarrhea and vomiting.  Musculoskeletal: Positive for back pain. Negative for myalgias.  Neurological: Positive for weakness. Negative for dizziness, light-headedness and headaches.  Psychiatric/Behavioral: Positive for confusion.    Objective:   Physical Exam:  BP (!) 144/84 (BP Location: Left Arm, Patient Position: Sitting) Comment: Liza RN is aware  Pulse Marland Kitchen)  48 Comment: Liza RN is aware  Temp 98 F (36.7 C)   Resp 17   Ht 5\' 10"  (1.778 m)   Wt 212 lb 1.6 oz (96.2 kg)   SpO2 98%   BMI 30.43 kg/m  ECOG: 2  Physical Exam Constitutional:      General: He is not in acute distress.    Appearance: He is not diaphoretic.  HENT:     Head: Normocephalic and atraumatic.  Eyes:     General: No scleral icterus.       Right eye: No discharge.        Left eye: No discharge.  Cardiovascular:     Rate and Rhythm: Normal rate and regular rhythm.     Heart sounds: Normal heart sounds. No murmur. No friction rub. No gallop.   Pulmonary:     Effort: Pulmonary effort is normal. No respiratory distress.     Breath sounds: Normal breath sounds. No wheezing or rales.  Skin:    General: Skin is warm and dry.     Findings: No erythema or rash.  Neurological:     Mental Status: He is alert.     Motor: No weakness.  Psychiatric:        Mood and Affect: Mood normal.        Behavior: Behavior normal.        Thought Content: Thought content normal.        Judgment: Judgment normal.     Lab Review:     Component Value Date/Time   NA 134 (L) 06/22/2019 1130   NA 135 (L) 06/13/2014 0944   K 3.4 (L) 06/22/2019 1130   K 5.4 (H) 06/13/2014 0944   CL 96 (L) 06/22/2019 1130   CO2 23 06/22/2019 1130   CO2 25 06/13/2014 0944   GLUCOSE 111 (H)  06/22/2019 1130   GLUCOSE 121 06/13/2014 0944   BUN 11 06/22/2019 1130   BUN 18.8 06/13/2014 0944   CREATININE 0.70 06/22/2019 1130   CREATININE 1.0 06/13/2014 0944   CALCIUM 8.7 (L) 06/22/2019 1130   CALCIUM 10.0 06/13/2014 0944   PROT 6.7 06/22/2019 1130   PROT 7.5 06/13/2014 0944   ALBUMIN 2.9 (L) 06/22/2019 1130   ALBUMIN 4.0 06/13/2014 0944   AST 11 (L) 06/22/2019 1130   AST 104 (H) 06/13/2014 0944   ALT 9 06/22/2019 1130   ALT 126 (H) 06/13/2014 0944   ALKPHOS 141 (H) 06/22/2019 1130   ALKPHOS 96 06/13/2014 0944   BILITOT 0.7 06/22/2019 1130   BILITOT 0.63 06/13/2014 0944   GFRNONAA >60 06/22/2019 1130   GFRAA >60 06/22/2019 1130       Component Value Date/Time   WBC 6.1 06/22/2019 1130   WBC 3.6 (L) 05/10/2019 1916   RBC 3.62 (L) 06/22/2019 1130   HGB 10.5 (L) 06/22/2019 1130   HGB 14.5 06/13/2014 0944   HCT 32.2 (L) 06/22/2019 1130   HCT 42.4 06/13/2014 0944   PLT 196 06/22/2019 1130   PLT 202 06/13/2014 0944   MCV 89.0 06/22/2019 1130   MCV 92.2 06/13/2014 0944   MCH 29.0 06/22/2019 1130   MCHC 32.6 06/22/2019 1130   RDW 15.9 (H) 06/22/2019 1130   RDW 12.7 06/13/2014 0944   LYMPHSABS 0.4 (L) 06/22/2019 1130   LYMPHSABS 1.8 06/13/2014 0944   MONOABS 0.7 06/22/2019 1130   MONOABS 0.9 06/13/2014 0944   EOSABS 0.2 06/22/2019 1130   EOSABS 0.1 06/13/2014 0944   BASOSABS 0.0 06/22/2019 1130  BASOSABS 0.1 06/13/2014 0944   -------------------------------  Imaging from last 24 hours (if applicable):  Radiology interpretation: No results found.

## 2019-06-27 ENCOUNTER — Other Ambulatory Visit: Payer: BC Managed Care – PPO

## 2019-06-27 ENCOUNTER — Telehealth: Payer: Self-pay

## 2019-06-27 NOTE — Telephone Encounter (Signed)
That is fine if he would like to cancel

## 2019-06-27 NOTE — Telephone Encounter (Signed)
Pt aware.

## 2019-06-27 NOTE — Telephone Encounter (Signed)
Pt called and he wants to know does he really need to get a echo done? He has terminal cancer and he has hospice coming in and he feels like he doesn't need it and doesn't want to waste his or our time; Is this okay for him to cancel?

## 2019-06-28 ENCOUNTER — Other Ambulatory Visit: Payer: Self-pay | Admitting: Oncology

## 2019-06-28 ENCOUNTER — Telehealth: Payer: Self-pay | Admitting: Oncology

## 2019-06-28 DIAGNOSIS — C61 Malignant neoplasm of prostate: Secondary | ICD-10-CM

## 2019-06-28 NOTE — Telephone Encounter (Signed)
Called patient regarding providers request, patient agreed to do virtual visit on 12/02.

## 2019-07-04 ENCOUNTER — Telehealth: Payer: Self-pay

## 2019-07-04 ENCOUNTER — Other Ambulatory Visit: Payer: Self-pay | Admitting: Oncology

## 2019-07-04 MED ORDER — HYDROMORPHONE HCL 4 MG PO TABS: ORAL_TABLET | ORAL | 0 refills | Status: AC

## 2019-07-04 NOTE — Telephone Encounter (Signed)
-----   Message from Wyatt Portela, MD sent at 07/04/2019 11:08 AM EST ----- Noted. Rx sent. Thanks ----- Message ----- From: Tami Lin, RN Sent: 07/04/2019  10:46 AM EST To: Wyatt Portela, MD  Refill Request Dilaudid 4 mg. Last filled 06/13/19 #90.  Patient also wanted to make you aware of new left hip pain and weakness on the left side. His left hip pain is 7/10. Also stated he fell a couple of times but did not injure himself. Stated he more so lowered himself to the floor. Stated he made his hospice nurse aware of the falls. He has a mychart visit with you on Wednesday and stated he will tell you more about what has been going on then.  Lanelle Bal

## 2019-07-06 ENCOUNTER — Inpatient Hospital Stay (HOSPITAL_BASED_OUTPATIENT_CLINIC_OR_DEPARTMENT_OTHER): Payer: BC Managed Care – PPO | Admitting: Oncology

## 2019-07-06 ENCOUNTER — Other Ambulatory Visit: Payer: Self-pay

## 2019-07-06 ENCOUNTER — Ambulatory Visit: Payer: BC Managed Care – PPO | Admitting: Oncology

## 2019-07-06 ENCOUNTER — Inpatient Hospital Stay: Payer: BC Managed Care – PPO | Attending: Oncology

## 2019-07-06 DIAGNOSIS — C61 Malignant neoplasm of prostate: Secondary | ICD-10-CM

## 2019-07-06 DIAGNOSIS — Z192 Hormone resistant malignancy status: Secondary | ICD-10-CM | POA: Insufficient documentation

## 2019-07-06 DIAGNOSIS — C7951 Secondary malignant neoplasm of bone: Secondary | ICD-10-CM | POA: Insufficient documentation

## 2019-07-06 LAB — CBC WITH DIFFERENTIAL (CANCER CENTER ONLY)
Abs Immature Granulocytes: 0.39 10*3/uL — ABNORMAL HIGH (ref 0.00–0.07)
Basophils Absolute: 0 10*3/uL (ref 0.0–0.1)
Basophils Relative: 0 %
Eosinophils Absolute: 0 10*3/uL (ref 0.0–0.5)
Eosinophils Relative: 0 %
HCT: 28 % — ABNORMAL LOW (ref 39.0–52.0)
Hemoglobin: 9 g/dL — ABNORMAL LOW (ref 13.0–17.0)
Immature Granulocytes: 5 %
Lymphocytes Relative: 3 %
Lymphs Abs: 0.2 10*3/uL — ABNORMAL LOW (ref 0.7–4.0)
MCH: 30.2 pg (ref 26.0–34.0)
MCHC: 32.1 g/dL (ref 30.0–36.0)
MCV: 94 fL (ref 80.0–100.0)
Monocytes Absolute: 0.6 10*3/uL (ref 0.1–1.0)
Monocytes Relative: 8 %
Neutro Abs: 7.1 10*3/uL (ref 1.7–7.7)
Neutrophils Relative %: 84 %
Platelet Count: 183 10*3/uL (ref 150–400)
RBC: 2.98 MIL/uL — ABNORMAL LOW (ref 4.22–5.81)
RDW: 17.6 % — ABNORMAL HIGH (ref 11.5–15.5)
WBC Count: 8.4 10*3/uL (ref 4.0–10.5)
nRBC: 0.6 % — ABNORMAL HIGH (ref 0.0–0.2)

## 2019-07-06 LAB — CMP (CANCER CENTER ONLY)
ALT: 13 U/L (ref 0–44)
AST: 17 U/L (ref 15–41)
Albumin: 3.2 g/dL — ABNORMAL LOW (ref 3.5–5.0)
Alkaline Phosphatase: 116 U/L (ref 38–126)
Anion gap: 11 (ref 5–15)
BUN: 31 mg/dL — ABNORMAL HIGH (ref 8–23)
CO2: 22 mmol/L (ref 22–32)
Calcium: 8.6 mg/dL — ABNORMAL LOW (ref 8.9–10.3)
Chloride: 99 mmol/L (ref 98–111)
Creatinine: 0.8 mg/dL (ref 0.61–1.24)
GFR, Est AFR Am: >60 mL/min (ref >60)
GFR, Estimated: >60 mL/min (ref >60)
Glucose, Bld: 144 mg/dL — ABNORMAL HIGH (ref 70–99)
Potassium: 4.4 mmol/L (ref 3.5–5.1)
Sodium: 132 mmol/L — ABNORMAL LOW (ref 135–145)
Total Bilirubin: 0.3 mg/dL (ref 0.3–1.2)
Total Protein: 5.9 g/dL — ABNORMAL LOW (ref 6.5–8.1)

## 2019-07-06 NOTE — Progress Notes (Signed)
Hematology and Oncology Follow Up for Telemedicine Visits  Alejandro [name] [id number] [birth year] 63 y.o. 07/06/2019 9:16 AM Alejandro [name], MDRusso, Alejandro Reichmann, MD   I connected with Alejandro [name] on 07/06/19 at  9:30 AM EST by video enabled telemedicine visit and verified that I am speaking with the correct person using two identifiers.   I discussed the limitations, risks, security and privacy concerns of performing an evaluation and management service by telemedicine and the availability of in-person appointments. I also discussed with the patient that there may be a patient responsible charge related to this service. The patient expressed understanding and agreed to proceed.  Other persons participating in the visit and their role in the encounter:    Patient's location:  Home Provider's location:  Office    Principle Diagnosis:  63 year old man with advanced prostate cancer that he is currently end-stage after initial diagnosis in 2015.  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   Prior Therapy:   Status post biopsy done on July of 2015 followed by lymph node dissection completed in 2015 at [facility].  He was treated there with definitive radiation therapy utilizing seed implant as well as external beam radiation completed in 2016.  He developed advanced disease with bone involvement.  He received radiation therapy to the pelvis in 2017.  Xtandi 160 mg daily that was poorly tolerated and was discontinued.  He was switched to [name] which she took until 2019 and developed progression of disease with bony metastasis as well as PSA of 80.  Taxotere chemotherapy at 75 mg/m cycle 1 given in March 2019.  He is status post 6 cycles of chemotherapy last cycle given on February 03, 2018.   Status post clinical trial utilizing Darolutamide as well as LuPSAM a part of a clinical trial at [facility] in [place].  He is status post 4 cycles  of therapy.  Last cycle given on October 01, 2018.  Therapy discontinued as a progression of disease.    Lynparza 300 mg twice daily started in July 2020.  Therapy discontinued because of poor tolerance and patient wishes.  Current therapy:   Supportive care only.     Interim History: Alejandro [name] reports few complaints at this time predominantly diffuse pain in his lower back and hip although it seems to be manageable at this time with the current regimen.  He was ambulating more freely up the last week but he has limited in his mobility at this time using a walker and occasional wheelchair.  Continues to be on dexamethasone 4 mg in the morning and 2 mg at nighttime.     Medications: I have reviewed the patient's current medications.  Current Outpatient Medications  Medication Sig Dispense Refill  . acetaminophen (TYLENOL) 325 MG tablet Take 650 mg by mouth every 6 (six) hours as needed for mild pain or fever.    Marland Kitchen alum & mag hydroxide-simeth (MAALOX/MYLANTA) 200-200-20 MG/5ML suspension Take 30 mLs by mouth every 6 (six) hours as needed for indigestion or heartburn.    Marland Kitchen amitriptyline (ELAVIL) 10 MG tablet Take 20 mg by mouth at bedtime.    . chlorproMAZINE (THORAZINE) 10 MG tablet Take 1 tablet (10 mg total) by mouth 2 (two) times daily as needed for hiccoughs. 20 tablet 0  . dexamethasone (DECADRON) 4 MG tablet Take 1 tablet (4 mg total) by mouth 2 (two) times daily with a meal. 60 tablet 1  . HYDROmorphone (DILAUDID) 4 MG tablet Take 1 to  2 every 3 hrs as needed. 90 tablet 0  . ibuprofen (ADVIL) 200 MG tablet Take 1,200 mg by mouth every 6 (six) hours as needed for fever, headache or moderate pain.    . meclizine (ANTIVERT) 25 MG tablet Take 1 tablet (25 mg total) by mouth 3 (three) times daily as needed for dizziness (vertigo). 20 tablet 0  . morphine (MS CONTIN) 15 MG 12 hr tablet Take 1 tablet (15 mg total) by mouth every 12 (twelve) hours. 60 tablet 0  . nebivolol  (BYSTOLIC) 5 MG tablet Take 1 tablet (5 mg total) by mouth daily. 90 tablet 0  . tamsulosin (FLOMAX) 0.4 MG CAPS capsule TAKE 1 CAPSULE BY MOUTH TWICE DAILY 60 capsule 0   No current facility-administered medications for this visit.      Allergies:  Allergies  Allergen Reactions  . Codeine Nausea Only    Past Medical History, Surgical history, Social history, and Family History were reviewed and updated.     Lab Results: Lab Results  Component Value Date   WBC 8.4 07/06/2019   HGB 9.0 (L) 07/06/2019   HCT 28.0 (L) 07/06/2019   MCV 94.0 07/06/2019   PLT 183 07/06/2019     Chemistry      Component Value Date/Time   NA 134 (L) 06/22/2019 1130   NA 135 (L) 06/13/2014 0944   K 3.4 (L) 06/22/2019 1130   K 5.4 (H) 06/13/2014 0944   CL 96 (L) 06/22/2019 1130   CO2 23 06/22/2019 1130   CO2 25 06/13/2014 0944   BUN 11 06/22/2019 1130   BUN 18.8 06/13/2014 0944   CREATININE 0.70 06/22/2019 1130   CREATININE 1.0 06/13/2014 0944      Component Value Date/Time   CALCIUM 8.7 (L) 06/22/2019 1130   CALCIUM 10.0 06/13/2014 0944   ALKPHOS 141 (H) 06/22/2019 1130   ALKPHOS 96 06/13/2014 0944   AST 11 (L) 06/22/2019 1130   AST 104 (H) 06/13/2014 0944   ALT 9 06/22/2019 1130   ALT 126 (H) 06/13/2014 0944   BILITOT 0.7 06/22/2019 1130   BILITOT 0.63 06/13/2014 0944       Impression and Plan:  63 year old man with the:  1.  Castration-resistant prostate cancer approaching end-stage status currently on supportive care and hospice only.    The natural course of this disease was updated today and given his overall declining health status he would not be a candidate for any aggressive measures.  For the time being we will continue with supportive care.   2.  Generalized pain: Manageable at this time with minimal pain medication.  He is using nonsteroidal anti-inflammatory which I recommended cutting down to avoid potential complications stronger pain medication can be called  for him.  I also offered him to obtain an MRI of the lumbar spine for the time being he deferred this option given his previous exposure to multiple radiation treatment.  3.  Leukocytopenia and thrombocytopenia: Resolved at this time with normal white cell count and platelet count.    4.  The dizziness and headaches: Manageable with the dexamethasone and likely related to his skeletal metastasis.  I recommended continuing 4 mg dexamethasone in the morning and 2 mg at nighttime for symptom management.  5.  Prognosis: Overall is poor with limited life expectancy given his disease progression as well as declining performance status.   6. Follow-up:  Will be as needed and will rely on hospice for updates.      I discussed  the assessment and treatment plan with the patient. The patient was provided an opportunity to ask questions and all were answered. The patient agreed with the plan and demonstrated an understanding of the instructions.   The patient was advised to call back or seek an in-person evaluation if the symptoms worsen or if the condition fails to improve as anticipated.  I provided 15 minutes of face-to-face video visit time during this encounter, and > 50% was dedicated to reviewing his disease status, addressing complications related therapy as well as future plan of care.  Zola Button, MD 07/06/2019 9:16 AM

## 2019-07-07 ENCOUNTER — Telehealth: Payer: Self-pay | Admitting: Oncology

## 2019-07-07 LAB — PROSTATE-SPECIFIC AG, SERUM (LABCORP): Prostate Specific Ag, Serum: 936 ng/mL — ABNORMAL HIGH (ref 0.0–4.0)

## 2019-07-07 NOTE — Telephone Encounter (Signed)
No los per 12/2.

## 2019-07-28 ENCOUNTER — Other Ambulatory Visit: Payer: Self-pay | Admitting: Oncology

## 2019-07-28 DIAGNOSIS — C61 Malignant neoplasm of prostate: Secondary | ICD-10-CM

## 2019-08-01 ENCOUNTER — Other Ambulatory Visit: Payer: Self-pay | Admitting: Cardiology

## 2019-08-01 ENCOUNTER — Telehealth: Payer: Self-pay

## 2019-08-01 DIAGNOSIS — I480 Paroxysmal atrial fibrillation: Secondary | ICD-10-CM

## 2019-08-01 NOTE — Telephone Encounter (Signed)
Patient called office stating he was having "scratchiness" in both his legs. Patient denies pain, discoloration, decreased mobility in legs. Patient stated he has been taking his dilaudid more frequently than he had been.  Per Sandi Mealy, "scratchiness" or itchiness may be due to the dilaudid.  Advised patient to try taking OTC benadryl to see if this helped resolve the itchiness.  Patient verbalized understanding. Patient instructed to call office with any questions or concerns.

## 2019-08-03 ENCOUNTER — Other Ambulatory Visit: Payer: Self-pay

## 2019-08-03 DIAGNOSIS — I10 Essential (primary) hypertension: Secondary | ICD-10-CM

## 2019-08-03 MED ORDER — NEBIVOLOL HCL 5 MG PO TABS: 5.0000 mg | ORAL_TABLET | Freq: Every day | ORAL | 0 refills | Status: AC

## 2019-08-22 IMAGING — CR DG CHEST 2V
2 series · 2 of 2 positions shown · non-contrast
Comparison: 10/07/2007

CLINICAL DATA: Fever. Ongoing chemotherapy for metastatic prostate
cancer.

EXAM:
CHEST - 2 VIEW

[w chest pa]
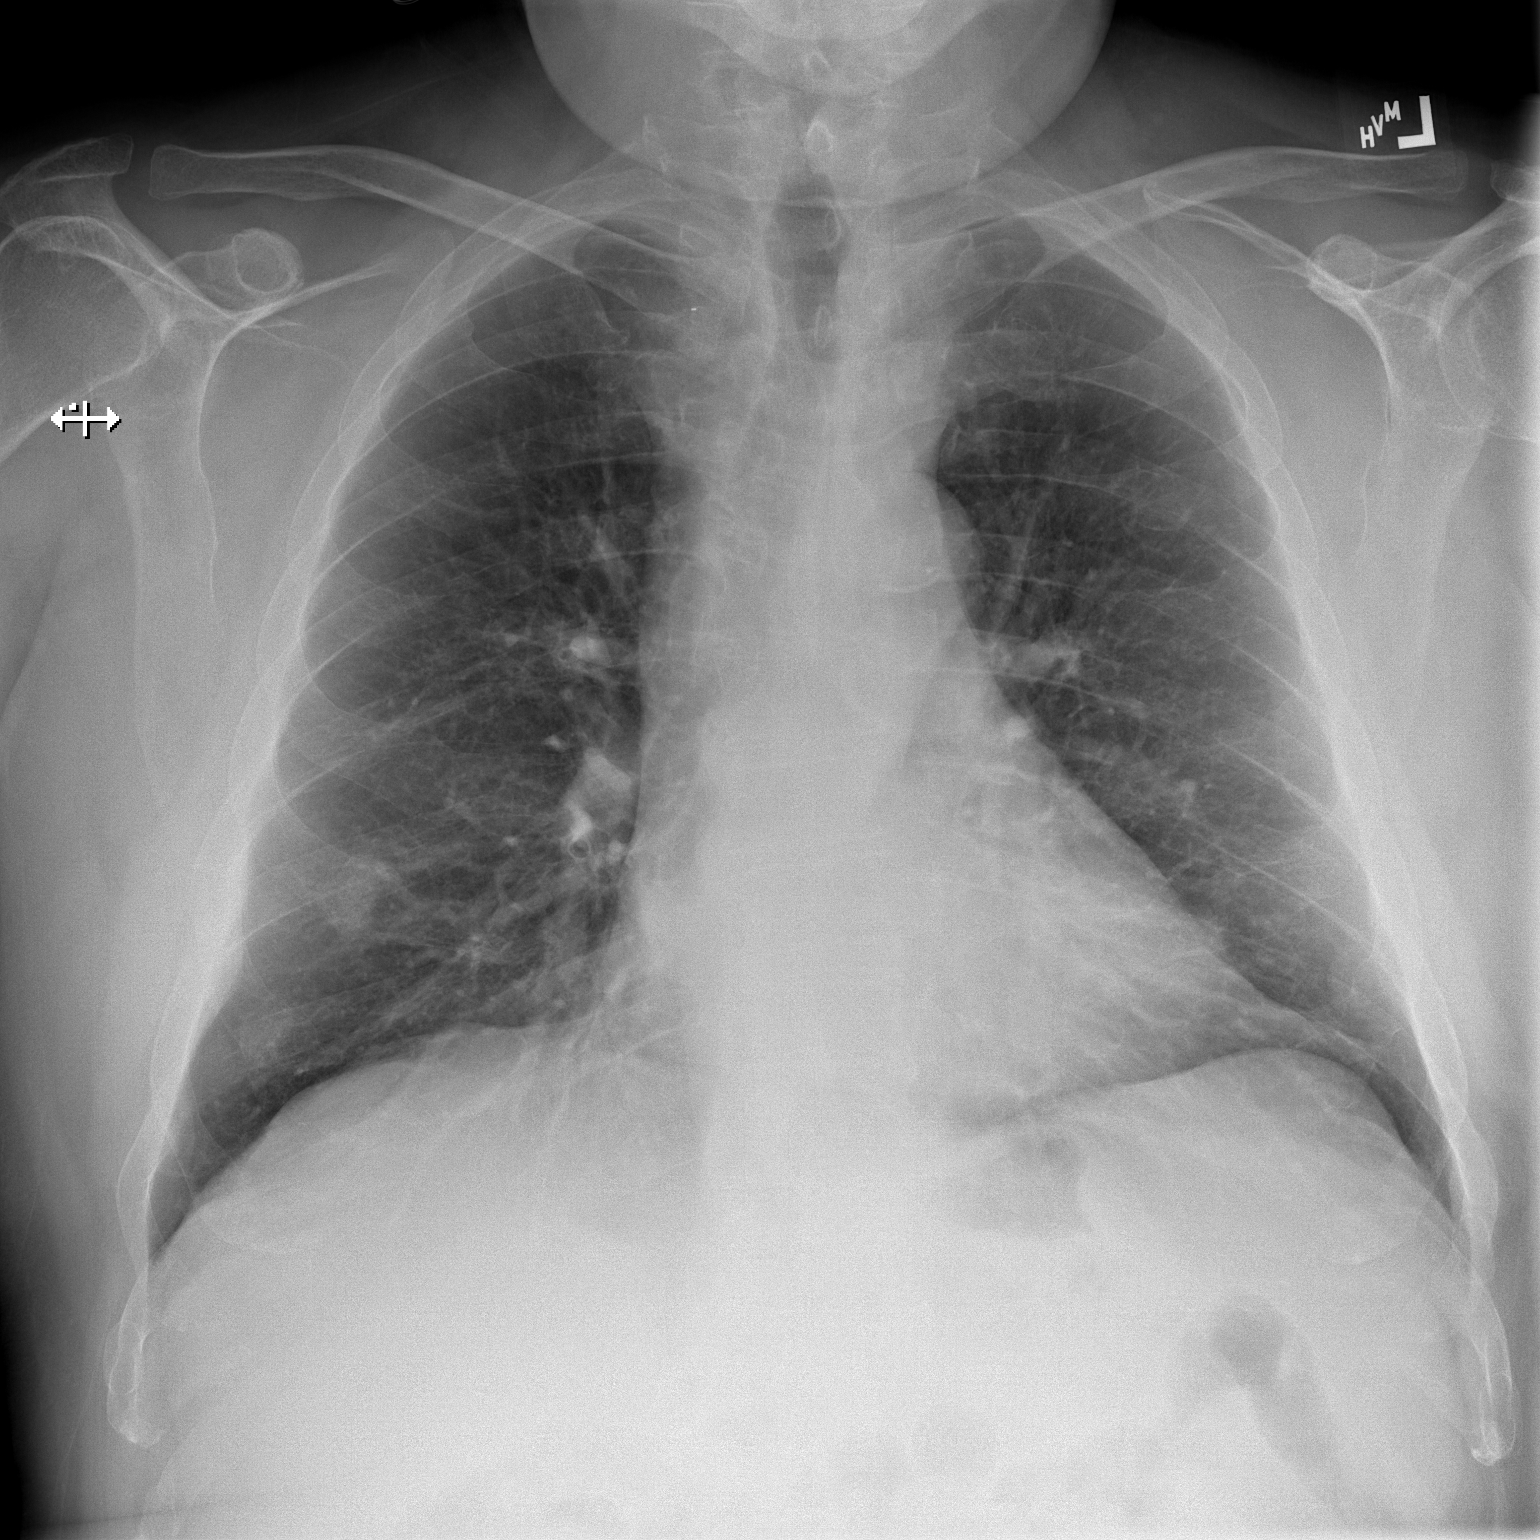

[w chest lat]
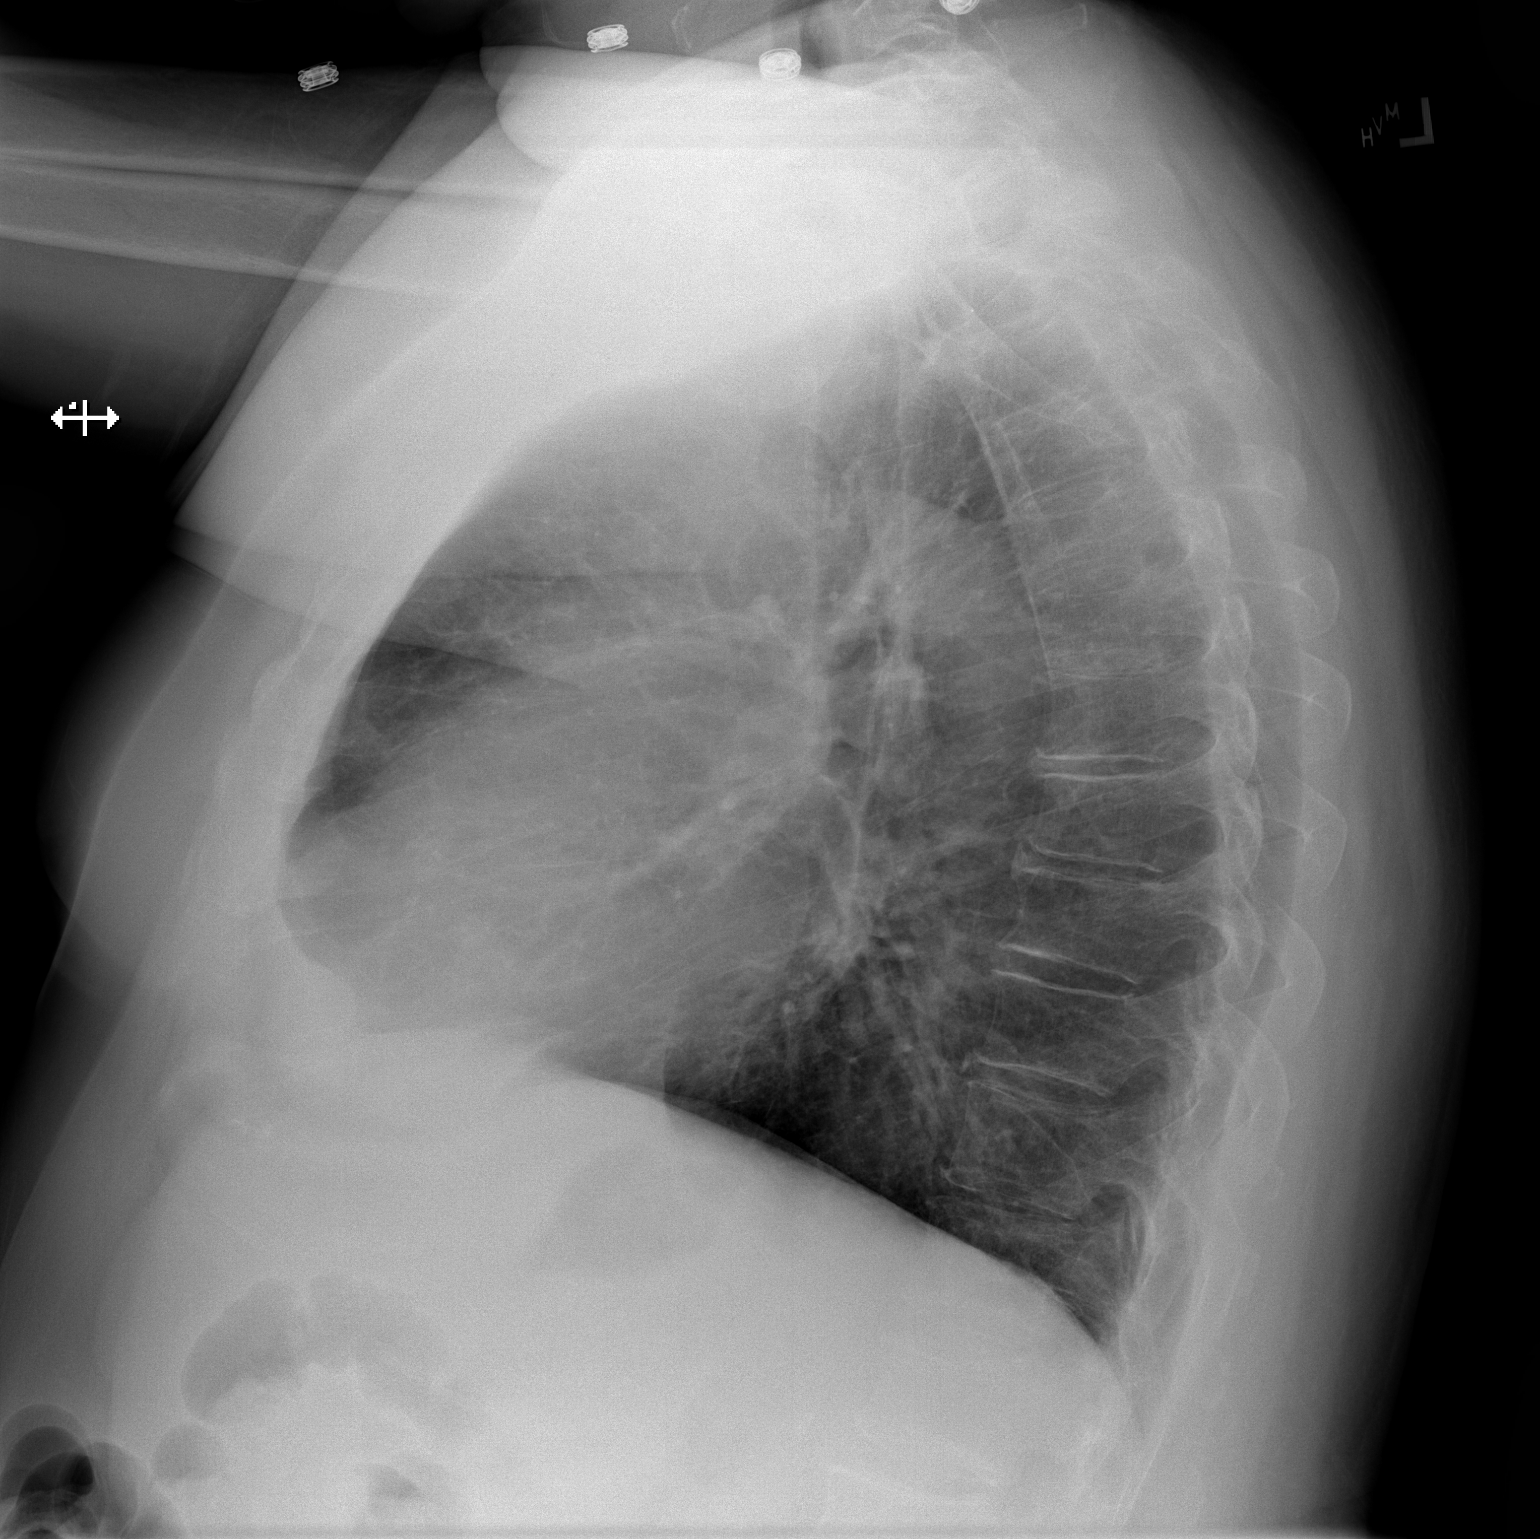

[2 of 2 positions shown; findings below may reference images not displayed]

FINDINGS: The cardiomediastinal silhouette is within normal limits. Aortic
atherosclerosis is noted. The lungs are mildly hyperinflated without
evidence of airspace consolidation, edema, pleural effusion,
pneumothorax. Old left rib fractures are noted.
IMPRESSION: No active cardiopulmonary disease.

## 2019-08-29 ENCOUNTER — Ambulatory Visit: Payer: BC Managed Care – PPO | Admitting: Cardiology

## 2019-09-05 DEATH — deceased

## 2020-10-14 IMAGING — NM NUCLEAR MEDICINE WHOLE BODY BONE SCINTIGRAPHY
2 series · 2 of 2 positions shown · non-contrast
Comparison: 03/07/2014.  CT chest, abdomen and pelvis 12/22/2018

CLINICAL DATA: Prostate cancer

EXAM:
NUCLEAR MEDICINE WHOLE BODY BONE SCAN
TECHNIQUE: Whole body anterior and posterior images were obtained approximately
3 hours after intravenous injection of radiopharmaceutical.
RADIOPHARMACEUTICALS:  19.5 mCi Rechnetium-77m MDP IV

[Series 1: whole body · 2.66mm/px · 1 of 1 slices shown (1 of 2)]
[im 1/1]
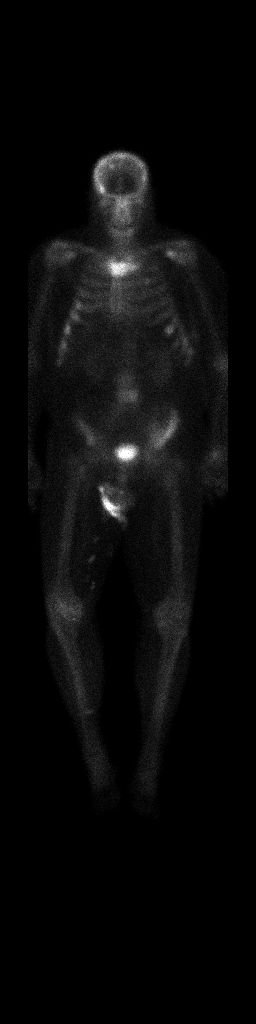

[Series 1: whole body · 2.66mm/px · 1 of 1 slices shown (2 of 2)]
[im 1/1]
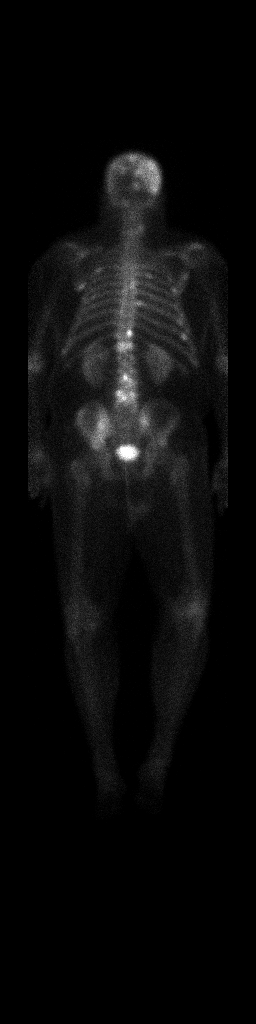

[2 of 2 positions shown; findings below may reference images not displayed]

FINDINGS: Extensive areas of abnormal bony uptake noted throughout the
calvarium, manubrium, bilateral ribs, thoracic and lumbar vertebral
bodies and iliac bones, left greater than right compatible with
osseous metastatic disease. Soft tissue activity unremarkable.
IMPRESSION: Evidence of extensive osseous metastatic disease as described above.

## 2021-03-30 NOTE — Telephone Encounter (Signed)
This encounter was created in error - please disregard.
